# Patient Record
Sex: Male | Born: 1950 | Race: Black or African American | Hispanic: No | Marital: Married | Smoking: Former smoker
Health system: Southern US, Community
[De-identification: ages and names within clinical notes are randomized; demographics above are authoritative.]

## PROBLEM LIST (undated history)

## (undated) DIAGNOSIS — R7303 Prediabetes: Secondary | ICD-10-CM

## (undated) DIAGNOSIS — E785 Hyperlipidemia, unspecified: Secondary | ICD-10-CM

## (undated) DIAGNOSIS — M1990 Unspecified osteoarthritis, unspecified site: Secondary | ICD-10-CM

## (undated) DIAGNOSIS — C61 Malignant neoplasm of prostate: Secondary | ICD-10-CM

## (undated) DIAGNOSIS — N289 Disorder of kidney and ureter, unspecified: Secondary | ICD-10-CM

## (undated) DIAGNOSIS — K219 Gastro-esophageal reflux disease without esophagitis: Secondary | ICD-10-CM

## (undated) DIAGNOSIS — K760 Fatty (change of) liver, not elsewhere classified: Secondary | ICD-10-CM

## (undated) DIAGNOSIS — N529 Male erectile dysfunction, unspecified: Secondary | ICD-10-CM

## (undated) DIAGNOSIS — I82409 Acute embolism and thrombosis of unspecified deep veins of unspecified lower extremity: Secondary | ICD-10-CM

## (undated) DIAGNOSIS — D689 Coagulation defect, unspecified: Secondary | ICD-10-CM

## (undated) HISTORY — DX: Coagulation defect, unspecified: D68.9

## (undated) HISTORY — DX: Gastro-esophageal reflux disease without esophagitis: K21.9

## (undated) HISTORY — DX: Male erectile dysfunction, unspecified: N52.9

## (undated) HISTORY — DX: Prediabetes: R73.03

## (undated) HISTORY — PX: FRACTURE SURGERY: SHX138

## (undated) HISTORY — DX: Hyperlipidemia, unspecified: E78.5

## (undated) HISTORY — DX: Disorder of kidney and ureter, unspecified: N28.9

## (undated) HISTORY — DX: Unspecified osteoarthritis, unspecified site: M19.90

## (undated) HISTORY — DX: Malignant neoplasm of prostate: C61

---

## 2000-01-09 HISTORY — PX: COLONOSCOPY: SHX174

## 2005-01-08 HISTORY — PX: NASAL SEPTUM SURGERY: SHX37

## 2011-08-11 LAB — HEPATIC FUNCTION PANEL
ALK PHOS: 64 U/L
ALT: 23 U/L (ref 10–40)
AST: 22 U/L
Total Bilirubin: 0.5 mg/dL

## 2011-08-11 LAB — CBC
HEMOGLOBIN: 15.4 g/dL
PLATELET COUNT: 214
WBC: 3.6

## 2011-08-11 LAB — PSA: PSA: 3.3

## 2011-08-11 LAB — HEMOGLOBIN A1C: A1c: 6.1

## 2011-08-11 LAB — SEDIMENTATION RATE: SED RATE: 3 mm

## 2011-08-11 LAB — TSH: TSH: 2.76

## 2013-02-13 ENCOUNTER — Ambulatory Visit (INDEPENDENT_AMBULATORY_CARE_PROVIDER_SITE_OTHER): Payer: Federal, State, Local not specified - PPO | Admitting: Family Medicine

## 2013-02-13 ENCOUNTER — Encounter: Payer: Self-pay | Admitting: Family Medicine

## 2013-02-13 VITALS — BP 140/74 | HR 72 | Temp 98.3°F | Ht 69.0 in | Wt 223.0 lb

## 2013-02-13 DIAGNOSIS — Z125 Encounter for screening for malignant neoplasm of prostate: Secondary | ICD-10-CM

## 2013-02-13 DIAGNOSIS — R0989 Other specified symptoms and signs involving the circulatory and respiratory systems: Secondary | ICD-10-CM

## 2013-02-13 DIAGNOSIS — Z0000 Encounter for general adult medical examination without abnormal findings: Secondary | ICD-10-CM

## 2013-02-13 DIAGNOSIS — Z23 Encounter for immunization: Secondary | ICD-10-CM

## 2013-02-13 DIAGNOSIS — Z1211 Encounter for screening for malignant neoplasm of colon: Secondary | ICD-10-CM

## 2013-02-13 DIAGNOSIS — R6889 Other general symptoms and signs: Secondary | ICD-10-CM

## 2013-02-13 NOTE — Progress Notes (Signed)
BP 140/74  Pulse 72  Temp(Src) 98.3 F (36.8 C) (Oral)  Ht 5\' 9"  (1.753 m)  Wt 223 lb (101.152 kg)  BMI 32.92 kg/m2   CC: new pt to establish  Subjective:    Patient ID: Juan Hunt, male    DOB: 02-Aug-1950, 63 y.o.   MRN: [id number]  HPI: Mete Stahlberg is a 63 y.o. male presenting on 02/13/2013 with [place]  Recently moved from [place] 3 yrs ago, retired Development worker, community.   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  Never told had gout. Significant throat drainage, with constant clearing throat mild discomfort left throat. + GERD - has awoken him from sleep.  + acid reflux.  Lives with wife Grown children Occupation: retired Development worker, community Edu: HS Activity: no regular exercise Diet: some water, fruits/vegetables daily 7th day adventist  Preventative: Colonoscopy - WNL 2000, would like repeat Prostate cancer screening - last WNL 4 yrs ago Flu shot - today Tetanus shot - thinks around 2000.  Declines today. zostavax - 2014  Relevant past medical, surgical, family and social history reviewed and updated. Allergies and medications reviewed and updated. No current outpatient prescriptions on file prior to visit.   No current facility-administered medications on file prior to visit.    Review of Systems  Constitutional: Negative for fever, chills, activity change, appetite change, fatigue and unexpected weight change.  HENT: Negative for hearing loss.   Eyes: Negative for visual disturbance.  Respiratory: Positive for wheezing (?bronchitis). Negative for cough, chest tightness and shortness of breath.   Cardiovascular: Negative for chest pain, palpitations and leg swelling.  Gastrointestinal: Negative for nausea, vomiting, abdominal pain, diarrhea, constipation, blood in stool and abdominal distention.  Genitourinary: Negative for hematuria and difficulty urinating.  Musculoskeletal: Negative for arthralgias, myalgias and neck pain.  Skin: Negative  for rash.  Neurological: Negative for dizziness, seizures, syncope and headaches.  Hematological: Negative for adenopathy. Does not bruise/bleed easily.  Psychiatric/Behavioral: Negative for dysphoric mood. The patient is not nervous/anxious.    Per HPI unless specifically indicated above    Objective:    BP 140/74  Pulse 72  Temp(Src) 98.3 F (36.8 C) (Oral)  Ht 5\' 9"  (1.753 m)  Wt 223 lb (101.152 kg)  BMI 32.92 kg/m2  Physical Exam  Nursing note and vitals reviewed. Constitutional: He is oriented to person, place, and time. He appears well-developed and well-nourished. No distress.  HENT:  Head: Normocephalic and atraumatic.  Right Ear: Hearing, tympanic membrane, external ear and ear canal normal.  Left Ear: Hearing, tympanic membrane, external ear and ear canal normal.  Nose: Nose normal.  Mouth/Throat: Uvula is midline, oropharynx is clear and moist and mucous membranes are normal. No oropharyngeal exudate, posterior oropharyngeal edema or posterior oropharyngeal erythema.  Eyes: Conjunctivae and EOM are normal. Pupils are equal, round, and reactive to light. No scleral icterus.  Neck: Normal range of motion. Neck supple.  Cardiovascular: Normal rate, regular rhythm, normal heart sounds and intact distal pulses.   No murmur heard. Pulses:      Radial pulses are 2+ on the right side, and 2+ on the left side.  Pulmonary/Chest: Effort normal and breath sounds normal. No respiratory distress. He has no wheezes. He has no rales.  Abdominal: Soft. Bowel sounds are normal. He exhibits no distension and no mass. There is no tenderness. There is no rebound and no guarding.  Genitourinary: Rectum normal and prostate normal. Rectal exam shows no external hemorrhoid, no internal hemorrhoid,  no fissure, no mass, no tenderness and anal tone normal. Prostate is not enlarged and not tender.  Musculoskeletal: Normal range of motion. He exhibits no edema.  Lymphadenopathy:    He has no  cervical adenopathy.  Neurological: He is alert and oriented to person, place, and time.  CN grossly intact, station and gait intact  Skin: Skin is warm and dry. No rash noted.  Psychiatric: He has a normal mood and affect. His behavior is normal. Judgment and thought content normal.   No results found for this or any previous visit.    Assessment & Plan:   Problem List Items Addressed This Visit   Chronic throat clearing     Anticipate GERD related. Recommended trial of zantac or pepcid bid prn.  If not improved, trial of prilosec OTC.  nexium OTC samples provided today Update if sxs persist or fail to improve.    Health maintenance examination - Primary     Preventative protocols reviewed and updated unless pt declined. Discussed healthy diet and lifestyle.     Relevant Orders      Lipid panel      Basic metabolic panel    Other Visit Diagnoses   Special screening for malignant neoplasms, colon        Relevant Orders       Ambulatory referral to Gastroenterology    Special screening for malignant neoplasm of prostate        Relevant Orders       PSA        Follow up plan: Return as needed, for return for blood work at [facility].

## 2013-02-13 NOTE — Assessment & Plan Note (Signed)
Preventative protocols reviewed and updated unless pt declined. Discussed healthy diet and lifestyle.  

## 2013-02-13 NOTE — Patient Instructions (Addendum)
Flu shot today. Return at your convenience fasting for blood work. Pass by [facility]'s office to schedule referral for colonsocopy Prostate feeling normal today. Start walking daily For throat clearing - start zantac or pepcid once or twice daily.  If this doesn't help, try prilosec OTC 20mg  as needed.

## 2013-02-13 NOTE — Addendum Note (Signed)
Addended by: Royann Shivers A on: 02/13/2013 10:53 AM   Modules accepted: Orders

## 2013-02-13 NOTE — Progress Notes (Signed)
Pre-visit discussion using our clinic review tool. No additional management support is needed unless otherwise documented below in the visit note.  

## 2013-02-13 NOTE — Assessment & Plan Note (Signed)
Anticipate GERD related. Recommended trial of zantac or pepcid bid prn.  If not improved, trial of prilosec OTC.  nexium OTC samples provided today Update if sxs persist or fail to improve.

## 2013-02-25 ENCOUNTER — Other Ambulatory Visit: Payer: Federal, State, Local not specified - PPO

## 2013-02-25 ENCOUNTER — Encounter: Payer: Self-pay | Admitting: Internal Medicine

## 2013-02-27 ENCOUNTER — Other Ambulatory Visit (INDEPENDENT_AMBULATORY_CARE_PROVIDER_SITE_OTHER): Payer: Federal, State, Local not specified - PPO

## 2013-02-27 DIAGNOSIS — Z125 Encounter for screening for malignant neoplasm of prostate: Secondary | ICD-10-CM

## 2013-02-27 DIAGNOSIS — Z0000 Encounter for general adult medical examination without abnormal findings: Secondary | ICD-10-CM

## 2013-02-27 LAB — BASIC METABOLIC PANEL
BUN: 16 mg/dL (ref 6–23)
CALCIUM: 9.8 mg/dL (ref 8.4–10.5)
CO2: 31 mEq/L (ref 19–32)
Chloride: 102 mEq/L (ref 96–112)
Creatinine, Ser: 1 mg/dL (ref 0.4–1.5)
GFR: 99.39 mL/min (ref >60.00)
GLUCOSE: 83 mg/dL (ref 70–99)
Potassium: 4.4 mEq/L (ref 3.5–5.1)
SODIUM: 137 meq/L (ref 135–145)

## 2013-02-27 LAB — LIPID PANEL
CHOL/HDL RATIO: 6
Cholesterol: 239 mg/dL — ABNORMAL HIGH (ref 0–200)
HDL: 37.6 mg/dL — AB (ref >39.00)
Triglycerides: 106 mg/dL (ref 0.0–149.0)
VLDL: 21.2 mg/dL (ref 0.0–40.0)

## 2013-02-27 LAB — LDL CHOLESTEROL, DIRECT: Direct LDL: 182.3 mg/dL

## 2013-02-27 LAB — PSA: PSA: 3.69 ng/mL (ref 0.10–4.00)

## 2013-03-02 ENCOUNTER — Encounter: Payer: Self-pay | Admitting: *Deleted

## 2013-04-08 HISTORY — PX: COLONOSCOPY: SHX174

## 2013-04-15 ENCOUNTER — Ambulatory Visit (AMBULATORY_SURGERY_CENTER): Payer: Self-pay

## 2013-04-15 VITALS — Ht 69.5 in | Wt 216.0 lb

## 2013-04-15 DIAGNOSIS — Z8371 Family history of colonic polyps: Secondary | ICD-10-CM

## 2013-04-15 DIAGNOSIS — Z83719 Family history of colon polyps, unspecified: Secondary | ICD-10-CM

## 2013-04-15 MED ORDER — MOVIPREP 100 G PO SOLR: 1.0000 | Freq: Once | ORAL | Status: DC

## 2013-04-23 ENCOUNTER — Encounter: Payer: Self-pay | Admitting: Internal Medicine

## 2013-04-23 ENCOUNTER — Ambulatory Visit (AMBULATORY_SURGERY_CENTER): Payer: Federal, State, Local not specified - PPO | Admitting: Internal Medicine

## 2013-04-23 VITALS — BP 121/70 | HR 49 | Temp 97.1°F | Resp 16 | Ht 69.0 in | Wt 216.0 lb

## 2013-04-23 DIAGNOSIS — Z8371 Family history of colonic polyps: Secondary | ICD-10-CM

## 2013-04-23 DIAGNOSIS — D126 Benign neoplasm of colon, unspecified: Secondary | ICD-10-CM

## 2013-04-23 DIAGNOSIS — Z1211 Encounter for screening for malignant neoplasm of colon: Secondary | ICD-10-CM

## 2013-04-23 MED ORDER — SODIUM CHLORIDE 0.9 % IV SOLN: 500.0000 mL | INTRAVENOUS | Status: DC

## 2013-04-23 NOTE — Op Note (Signed)
[place]  [address] [place], [phone]   COLONOSCOPY PROCEDURE REPORT  PATIENT: Daniil, Boerman  MR#: [id number] BIRTHDATE: [birth year] , 63  yrs. old GENDER: Male ENDOSCOPIST: Jerene Bears, MD REFERRED [id number] Danise Mina, M.D. PROCEDURE DATE:  04/23/2013 PROCEDURE:   Colonoscopy with snare polypectomy First Screening Colonoscopy - Avg.  risk and is 50 yrs.  old or older - No.  Prior Negative Screening - Now for repeat screening. 10 or more years since last screening  History of Adenoma - Now for follow-up colonoscopy & has been > or = to 3 yrs.  N/A  Polyps Removed Today? Yes. ASA CLASS:   Class II INDICATIONS:average risk screening and Last colonoscopy performed 12 yrs ago. MEDICATIONS: MAC sedation, administered by CRNA and propofol (Diprivan) 200mg  IV  DESCRIPTION OF PROCEDURE:   After the risks benefits and alternatives of the procedure were thoroughly explained, informed consent was obtained.  A digital rectal exam revealed no rectal mass.   The LB [id number]  endoscope was introduced through the anus and advanced to the cecum, which was identified by both the appendix and ileocecal valve. No adverse events experienced. The quality of the prep was good, using MoviPrep  The instrument was then slowly withdrawn as the colon was fully examined.  COLON FINDINGS: Two sessile polyps measuring 4-5 mm in size were found in the ascending colon and transverse colon.  Polypectomy was performed using cold snare.  All resections were complete and all polyp tissue was completely retrieved.   There was moderate diverticulosis noted in the descending colon and sigmoid colon with associated muscular hypertrophy.  Retroflexed views revealed no abnormalities. The time to cecum=3 minutes 01 seconds.  Withdrawal time=11 minutes 03 seconds.  The scope was withdrawn and the procedure completed. COMPLICATIONS: There were no complications.  ENDOSCOPIC  IMPRESSION: 1.   Two sessile polyps measuring 4-5 mm in size were found in the ascending colon and transverse colon; Polypectomy was performed using cold snare 2.   There was moderate diverticulosis noted in the descending colon and sigmoid colon  RECOMMENDATIONS: 1.  Await pathology results 2.  High fiber diet 3.  If the polyps removed today are proven to be adenomatous (pre-cancerous) polyps, you will need a repeat colonoscopy in 5 years.  Otherwise you should continue to follow colorectal cancer screening guidelines for "routine risk" patients with colonoscopy in 10 years.  You will receive a letter within 1-2 weeks with the results of your biopsy as well as final recommendations.  Please call my office if you have not received a letter after 3 weeks.   eSigned:  Jerene Bears, MD 04/23/2013 11:33 AM    cc: The Patient and Ria Bush MD

## 2013-04-23 NOTE — Patient Instructions (Signed)
YOU HAD AN ENDOSCOPIC PROCEDURE TODAY AT THE [facility]: Refer to the procedure report that was given to you for any specific questions about what was found during the examination.  If the procedure report does not answer your questions, please call your gastroenterologist to clarify.  If you requested that your care partner not be given the details of your procedure findings, then the procedure report has been included in a sealed envelope for you to review at your convenience later.  YOU SHOULD EXPECT: Some feelings of bloating in the abdomen. Passage of more gas than usual.  Walking can help get rid of the air that was put into your GI tract during the procedure and reduce the bloating. If you had a lower endoscopy (such as a colonoscopy or flexible sigmoidoscopy) you may notice spotting of blood in your stool or on the toilet paper. If you underwent a bowel prep for your procedure, then you may not have a normal bowel movement for a few days.  DIET: Your first meal following the procedure should be a light meal and then it is ok to progress to your normal diet.  A half-sandwich or bowl of soup is an example of a good first meal.  Heavy or fried foods are harder to digest and may make you feel nauseous or bloated.  Likewise meals heavy in dairy and vegetables can cause extra gas to form and this can also increase the bloating.  Drink plenty of fluids but you should avoid alcoholic beverages for 24 hours.  ACTIVITY: Your care partner should take you home directly after the procedure.  You should plan to take it easy, moving slowly for the rest of the day.  You can resume normal activity the day after the procedure however you should NOT DRIVE or use heavy machinery for 24 hours (because of the sedation medicines used during the test).    SYMPTOMS TO REPORT IMMEDIATELY: A gastroenterologist can be reached at any hour.  During normal business hours, 8:30 AM to 5:00 PM Monday through Friday,  call [phone].  After hours and on weekends, please call the GI answering service at [phone] who will take a message and have the physician on call contact you.   Following lower endoscopy (colonoscopy or flexible sigmoidoscopy):  Excessive amounts of blood in the stool  Significant tenderness or worsening of abdominal pains  Swelling of the abdomen that is new, acute  Fever of 100F or higher   FOLLOW UP: If any biopsies were taken you will be contacted by phone or by letter within the next 1-3 weeks.  Call your gastroenterologist if you have not heard about the biopsies in 3 weeks.  Our staff will call the home number listed on your records the next business day following your procedure to check on you and address any questions or concerns that you may have at that time regarding the information given to you following your procedure. This is a courtesy call and so if there is no answer at the home number and we have not heard from you through the emergency physician on call, we will assume that you have returned to your regular daily activities without incident.  SIGNATURES/CONFIDENTIALITY: You and/or your care partner have signed paperwork which will be entered into your electronic medical record.  These signatures attest to the fact that that the information above on your After Visit Summary has been reviewed and is understood.  Full responsibility of the confidentiality of   this discharge information lies with you and/or your care-partner.   Handouts were given to your care partner on polyps, diverticulosis and a high fiber diet with liberal fluid intake. You may resume your current medications today. Await pathology results. Please call if any questions or concerns.

## 2013-04-23 NOTE — Progress Notes (Signed)
Report to pacu rn, vss, bbs=clear 

## 2013-04-23 NOTE — Progress Notes (Signed)
Called to room to assist during endoscopic procedure.  Patient ID and intended procedure confirmed with present staff. Received instructions for my participation in the procedure from the performing physician.  

## 2013-04-24 ENCOUNTER — Telehealth: Payer: Self-pay | Admitting: *Deleted

## 2013-04-24 NOTE — Telephone Encounter (Signed)
  Follow up Call-  Call back number 04/23/2013  Post procedure Call Back phone  # [phone]  Permission to leave phone message Yes    [facility]

## 2013-04-27 ENCOUNTER — Encounter: Payer: Self-pay | Admitting: *Deleted

## 2013-05-11 ENCOUNTER — Encounter: Payer: Self-pay | Admitting: Internal Medicine

## 2013-05-13 ENCOUNTER — Encounter: Payer: Self-pay | Admitting: Family Medicine

## 2014-01-12 ENCOUNTER — Telehealth: Payer: Self-pay | Admitting: Family Medicine

## 2014-01-12 NOTE — Telephone Encounter (Signed)
Pt made my chart message see below  Is it ok to wait till 1/12  Annual Physical I have a consistent slight pain in my left eye along with sudden twitching of the eye. Also the eye will blur when watching tv or reading. These symptoms usually last for about a minute and then return unexpectedly.

## 2014-01-13 NOTE — Telephone Encounter (Signed)
Will eval at OV

## 2014-01-19 ENCOUNTER — Ambulatory Visit (INDEPENDENT_AMBULATORY_CARE_PROVIDER_SITE_OTHER): Payer: Federal, State, Local not specified - PPO | Admitting: Family Medicine

## 2014-01-19 ENCOUNTER — Encounter: Payer: Self-pay | Admitting: Family Medicine

## 2014-01-19 VITALS — BP 140/80 | HR 64 | Temp 98.0°F | Ht 69.0 in | Wt 221.2 lb

## 2014-01-19 DIAGNOSIS — G245 Blepharospasm: Secondary | ICD-10-CM

## 2014-01-19 MED ORDER — TADALAFIL 20 MG PO TABS: 20.0000 mg | ORAL_TABLET | Freq: Every day | ORAL | Status: DC | PRN

## 2014-01-19 NOTE — Addendum Note (Signed)
Addended by: Ria Bush on: 01/19/2014 10:03 AM   Modules accepted: Miquel Dunn

## 2014-01-19 NOTE — Assessment & Plan Note (Signed)
Anticipate left sided benign essential blepharospasm, discussed this He is due for eye exam - suggested he schedule this. Snellen normal today.

## 2014-01-19 NOTE — Patient Instructions (Signed)
Return after 2/6 for next physical. Vision check today Schedule appointment with eye doctor for vision check.  Benign Essential Blepharospasm Benign essential blepharospasm (BEB) is a condition in which the muscles of the eyelids have involuntary (you cannot control them) contractions and spasms. In this condition, the eyes seem to be winking or even seem to be forced shut. There may be twitching of the eyelids that lasts for a long period of time.  BEB tends to occur while you are awake. The disorder may get worse, to the point that the eyes are closed for long periods of time. Although this interferes with vision, it is not a form of blindness, because the eyes themselves are not abnormal and are capable of seeing. You are simply unable to see through the closed lids. BEB seems to occur most commonly in middle aged and elderly women. BEB is different from occasional twitching of the eyelids, which happens to many people. Occasional twitching is called "benign fasciculation," and it has no lasting effect. It goes away within a few hours or days.  SYMPTOMS  BEB usually starts slowly. At first, you may:  Be blinking a lot.  Have a feeling of irritation in your eyes.  Notice your eyelids twitching a lot.  Be squinting more than usual; especially in bright light. As time goes on, you may have difficulty keeping your eyes open. In general, symptoms occur while you are awake and go away while you sleep. As time goes on, all of the symptoms seem to get worse and more intense. TREATMENT  Most commonly, BEB is treated with injections of botulinum toxin (Botox). Botox relaxes the muscles, stopping the twitching. Treatment may cause drooping eyelids, blurring of vision, dry eyes, or double vision, but these symptoms are usually temporary. Document Released: 12/15/2001 Document Revised: 03/19/2011 Document Reviewed: 11/04/2008 [facility] Patient Information 2015 [place], [place]. This information is not  intended to replace advice given to you by your health care provider. Make sure you discuss any questions you have with your health care provider.

## 2014-01-19 NOTE — Progress Notes (Addendum)
BP 140/80 mmHg  Pulse 64  Temp(Src) 98 F (36.7 C) (Oral)  Ht 5\' 9"  (1.753 m)  Wt 221 lb 4 oz (100.358 kg)  BMI 32.66 kg/m2   CC: CPE- but a bit early so changed to acute visit - twitching L eye Subjective:    Patient ID: Juan Hunt, male    DOB: [birth year], 64 y.o.   MRN: [id number]  HPI: Juan Hunt is a 64 y.o. male presenting on 01/19/2014 for Annual Exam and Eye twitch   CPE not done - converted to acute visit. Traveled to [place] for holidays.   L eye has been twitching for last four months. Sometimes has difficulty focusing L eye. Started with L eye discomfort that lasted 2 weeks. Discomfort has improved, but twitching has continued. Random twitching, more noticeable with bright light or when reading.   Last eye exam was 2 yrs ago.  Denies flashing lights, aura. Occasional floaters and drainage from L eye. No vision loss but does note more blurry vision occasionally. No other facial spasm or weakness. No headaches.  Started walking daily, increasing fruits/vegetables   Preventative: COLONOSCOPY Date: 04/2013 2 TA, mod diverticulosis, rpt 5 yrs (Pyrtle) Prostate cancer screening - last WNL 4 yrs ago Flu shot - today Tetanus shot - thinks around 2000. Declines today. zostavax - 2014  Relevant past medical, surgical, family and social history reviewed and updated as indicated. Interim medical history since our last visit reviewed. Allergies and medications reviewed and updated. Current Outpatient Prescriptions on File Prior to Visit  Medication Sig  . Multiple Vitamin (MULTIVITAMIN) tablet Take 1 tablet by mouth daily.  . Omega-3 Fatty Acids (OMEGA 3 PO) Take 1 capsule by mouth daily.   No current facility-administered medications on file prior to visit.    Review of Systems Per HPI unless specifically indicated above     Objective:    BP 140/80 mmHg  Pulse 64  Temp(Src) 98 F (36.7 C) (Oral)  Ht 5\' 9"  (1.753 m)  Wt 221 lb 4 oz (100.358 kg)  BMI 32.66  kg/m2  Wt Readings from Last 3 Encounters:  01/19/14 221 lb 4 oz (100.358 kg)  04/23/13 216 lb (97.977 kg)  04/15/13 216 lb (97.977 kg)    Physical Exam  Constitutional: He is oriented to person, place, and time. He appears well-developed and well-nourished. No distress.  HENT:  Head: Normocephalic and atraumatic.  Mouth/Throat: Oropharynx is clear and moist. No oropharyngeal exudate.  Eyes: Conjunctivae, EOM and lids are normal. Pupils are equal, round, and reactive to light. Right eye exhibits no discharge. Left eye exhibits no discharge. Right conjunctiva is not injected. Left conjunctiva is not injected.  Intact optic nerves bilaterally on limited fundoscopic exam Early cataracts L>R  Neck: Normal range of motion. Neck supple. No thyromegaly present.  Neurological: He is alert and oriented to person, place, and time. He has normal strength. No cranial nerve deficit or sensory deficit.  CN 2-12 intact  Skin: Skin is warm and dry. No rash noted.  Psychiatric: He has a normal mood and affect.  Nursing note and vitals reviewed.      Assessment & Plan:   Problem List Items Addressed This Visit    Blepharospasm - Primary    Anticipate left sided benign essential blepharospasm, discussed this He is due for eye exam - suggested he schedule this. Snellen normal today.        Follow up plan: Return in about 4 weeks (around 02/16/2014), or as needed,  for annual exam, prior fasting for blood work.

## 2014-01-19 NOTE — Progress Notes (Signed)
Pre visit review using our clinic review tool, if applicable. No additional management support is needed unless otherwise documented below in the visit note. 

## 2014-02-13 ENCOUNTER — Other Ambulatory Visit: Payer: Self-pay | Admitting: Family Medicine

## 2014-02-13 ENCOUNTER — Encounter: Payer: Self-pay | Admitting: Family Medicine

## 2014-02-13 DIAGNOSIS — R7303 Prediabetes: Secondary | ICD-10-CM

## 2014-02-13 DIAGNOSIS — E785 Hyperlipidemia, unspecified: Secondary | ICD-10-CM

## 2014-02-13 DIAGNOSIS — Z125 Encounter for screening for malignant neoplasm of prostate: Secondary | ICD-10-CM

## 2014-02-13 HISTORY — DX: Prediabetes: R73.03

## 2014-02-13 HISTORY — DX: Hyperlipidemia, unspecified: E78.5

## 2014-02-16 ENCOUNTER — Other Ambulatory Visit (INDEPENDENT_AMBULATORY_CARE_PROVIDER_SITE_OTHER): Payer: Federal, State, Local not specified - PPO

## 2014-02-16 DIAGNOSIS — Z125 Encounter for screening for malignant neoplasm of prostate: Secondary | ICD-10-CM

## 2014-02-16 DIAGNOSIS — E785 Hyperlipidemia, unspecified: Secondary | ICD-10-CM

## 2014-02-16 DIAGNOSIS — R7303 Prediabetes: Secondary | ICD-10-CM

## 2014-02-16 DIAGNOSIS — R7309 Other abnormal glucose: Secondary | ICD-10-CM

## 2014-02-16 LAB — HEMOGLOBIN A1C: Hgb A1c MFr Bld: 6.1 % (ref 4.6–6.5)

## 2014-02-16 LAB — BASIC METABOLIC PANEL
BUN: 17 mg/dL (ref 6–23)
CO2: 28 mEq/L (ref 19–32)
CREATININE: 0.99 mg/dL (ref 0.40–1.50)
Calcium: 9.3 mg/dL (ref 8.4–10.5)
Chloride: 104 mEq/L (ref 96–112)
GFR: 97.92 mL/min (ref >60.00)
Glucose, Bld: 97 mg/dL (ref 70–99)
POTASSIUM: 4.1 meq/L (ref 3.5–5.1)
Sodium: 138 mEq/L (ref 135–145)

## 2014-02-16 LAB — PSA: PSA: 3.87 ng/mL (ref 0.10–4.00)

## 2014-02-16 LAB — LIPID PANEL
CHOLESTEROL: 244 mg/dL — AB (ref 0–200)
HDL: 38.8 mg/dL — ABNORMAL LOW (ref >39.00)
LDL CALC: 177 mg/dL — AB (ref 0–99)
NONHDL: 205.2
Total CHOL/HDL Ratio: 6
Triglycerides: 140 mg/dL (ref 0.0–149.0)
VLDL: 28 mg/dL (ref 0.0–40.0)

## 2014-02-19 ENCOUNTER — Encounter: Payer: Self-pay | Admitting: Family Medicine

## 2014-02-19 ENCOUNTER — Ambulatory Visit (INDEPENDENT_AMBULATORY_CARE_PROVIDER_SITE_OTHER): Payer: Federal, State, Local not specified - PPO | Admitting: Family Medicine

## 2014-02-19 VITALS — BP 134/76 | HR 72 | Temp 97.5°F | Ht 69.0 in | Wt 223.2 lb

## 2014-02-19 DIAGNOSIS — E785 Hyperlipidemia, unspecified: Secondary | ICD-10-CM

## 2014-02-19 DIAGNOSIS — R7303 Prediabetes: Secondary | ICD-10-CM

## 2014-02-19 DIAGNOSIS — S6991XA Unspecified injury of right wrist, hand and finger(s), initial encounter: Secondary | ICD-10-CM | POA: Insufficient documentation

## 2014-02-19 DIAGNOSIS — Z0000 Encounter for general adult medical examination without abnormal findings: Secondary | ICD-10-CM

## 2014-02-19 DIAGNOSIS — Z23 Encounter for immunization: Secondary | ICD-10-CM

## 2014-02-19 DIAGNOSIS — E663 Overweight: Secondary | ICD-10-CM | POA: Insufficient documentation

## 2014-02-19 DIAGNOSIS — E66811 Obesity, class 1: Secondary | ICD-10-CM | POA: Insufficient documentation

## 2014-02-19 DIAGNOSIS — E669 Obesity, unspecified: Secondary | ICD-10-CM | POA: Insufficient documentation

## 2014-02-19 MED ORDER — DOXYCYCLINE HYCLATE 100 MG PO CAPS: 100.0000 mg | ORAL_CAPSULE | Freq: Two times a day (BID) | ORAL | Status: DC

## 2014-02-19 NOTE — Assessment & Plan Note (Signed)
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 Tdap today. Start doxy 7d course Red flags to seek care discussed.

## 2014-02-19 NOTE — Assessment & Plan Note (Signed)
Body mass index is 32.95 kg/(m^2). Discussed healthy diet and lifestyle changes to affect sustainable weight loss.

## 2014-02-19 NOTE — Patient Instructions (Addendum)
Tdap and flu shots today. Start antibiotic doxycycline twice daily for 7 days for thumb pain. Let us know if not improving as expected. Work over next 6 months on healthy diet and lifestyle changes and weight loss. Decrease added sugars, eliminate trans fats, increase fiber and limit alcohol.  All these changes together can drop triglycerides by almost 50%. Return in 6 months for labwork only Return in 1 year for next physical.

## 2014-02-19 NOTE — Assessment & Plan Note (Signed)
Reviewed #s. Discussed importance of weight loss and avoiding added sugars.

## 2014-02-19 NOTE — Progress Notes (Signed)
Pre visit review using our clinic review tool, if applicable. No additional management support is needed unless otherwise documented below in the visit note. 

## 2014-02-19 NOTE — Progress Notes (Signed)
BP 134/76 mmHg  Pulse 72  Temp(Src) 97.5 F (36.4 C) (Oral)  Ht 5\' 9"  (1.753 m)  Wt 223 lb 4 oz (101.266 kg)  BMI 32.95 kg/m2   CC: CPE  Subjective:    Patient ID: Juan Hunt, male    DOB: 1950/04/08, 64 y.o.   MRN: [id number]  HPI: Juan Hunt is a 64 y.o. male presenting on 02/19/2014 for Annual Exam   Recent injury 4d ago while doing some plumbing work. Nit R thumb on pipe, broke skin. Getting red and tender. Some draining initially but not recently. Has been cleaning with abx ointment.   Changing diet recently, cutting out fried foods, eating more grains and vegetables. Eating fiber one cereal. More oatmeal. He has also increased walking regimen.   Preventative: COLONOSCOPY Date: 04/2013 2 TA, mod diverticulosis, rpt 5 yrs ([facility]) Prostate cancer screening - last WNL 4 yrs ago  Flu shot - today Tdap today zostavax - 2014  Lives with wife Grown children Occupation: retired Development worker, community, still does some plumbing work.  Edu: HS Activity: no regular exercise Diet: some water, fruits/vegetables daily 7th day adventist  Relevant past medical, surgical, family and social history reviewed and updated as indicated. Interim medical history since our last visit reviewed. Allergies and medications reviewed and updated. Current Outpatient Prescriptions on File Prior to Visit  Medication Sig  . Multiple Vitamin (MULTIVITAMIN) tablet Take 1 tablet by mouth daily.  . Omega-3 Fatty Acids (OMEGA 3 PO) Take 1 capsule by mouth daily.  . tadalafil (CIALIS) 20 MG tablet Take 1 tablet (20 mg total) by mouth daily as needed for erectile dysfunction.  . Wheat Dextrin (BENEFIBER) POWD Take 1 scoop by mouth daily.   No current facility-administered medications on file prior to visit.    Review of Systems  Constitutional: Negative for fever, chills, activity change, appetite change, fatigue and unexpected weight change.  HENT: Negative for hearing loss.   Eyes: Negative for visual  disturbance.  Respiratory: Negative for cough, chest tightness, shortness of breath and wheezing.   Cardiovascular: Negative for chest pain, palpitations and leg swelling.  Gastrointestinal: Negative for nausea, vomiting, abdominal pain, diarrhea, constipation, blood in stool and abdominal distention.  Genitourinary: Negative for hematuria and difficulty urinating.  Musculoskeletal: Negative for myalgias, arthralgias and neck pain.  Skin: Negative for rash.  Neurological: Negative for dizziness, seizures, syncope and headaches.  Hematological: Negative for adenopathy. Does not bruise/bleed easily.  Psychiatric/Behavioral: Negative for dysphoric mood. The patient is not nervous/anxious.    Per HPI unless specifically indicated above     Objective:    BP 134/76 mmHg  Pulse 72  Temp(Src) 97.5 F (36.4 C) (Oral)  Ht 5\' 9"  (1.753 m)  Wt 223 lb 4 oz (101.266 kg)  BMI 32.95 kg/m2  Wt Readings from Last 3 Encounters:  02/19/14 223 lb 4 oz (101.266 kg)  01/19/14 221 lb 4 oz (100.358 kg)  04/23/13 216 lb (97.977 kg)    Physical Exam  Constitutional: He is oriented to person, place, and time. He appears well-developed and well-nourished. No distress.  HENT:  Head: Normocephalic and atraumatic.  Right Ear: Hearing, tympanic membrane, external ear and ear canal normal.  Left Ear: Hearing, tympanic membrane, external ear and ear canal normal.  Nose: Nose normal.  Mouth/Throat: Uvula is midline, oropharynx is clear and moist and mucous membranes are normal. No oropharyngeal exudate, posterior oropharyngeal edema or posterior oropharyngeal erythema.  Eyes: Conjunctivae and EOM are normal. Pupils are equal, round, and reactive  to light. No scleral icterus.  Neck: Normal range of motion. Neck supple. Carotid bruit is not present. No thyromegaly present.  Cardiovascular: Normal rate, regular rhythm, normal heart sounds and intact distal pulses.   No murmur heard. Pulses:      Radial pulses are  2+ on the right side, and 2+ on the left side.  Pulmonary/Chest: Effort normal and breath sounds normal. No respiratory distress. He has no wheezes. He has no rales.  Abdominal: Soft. Bowel sounds are normal. He exhibits no distension and no mass. There is no tenderness. There is no rebound and no guarding.  Genitourinary: Rectum normal. Rectal exam shows no external hemorrhoid, no internal hemorrhoid, no fissure, no mass, no tenderness and anal tone normal. Prostate is enlarged (25-30gm). Prostate is not tender.  Musculoskeletal: Normal range of motion. He exhibits no edema.  R thumb distal to dorsal IP joint with erosion with mild surrounding erythema/induration. IP joint ROM intact  Lymphadenopathy:    He has no cervical adenopathy.  Neurological: He is alert and oriented to person, place, and time.  CN grossly intact, station and gait intact  Skin: Skin is warm and dry. No rash noted.  Psychiatric: He has a normal mood and affect. His behavior is normal. Judgment and thought content normal.  Nursing note and vitals reviewed.  Results for orders placed or performed in visit on 02/16/14  Lipid panel  Result Value Ref Range   Cholesterol 244 (H) 0 - 200 mg/dL   Triglycerides 140.0 0.0 - 149.0 mg/dL   HDL 38.80 (L) >39.00 mg/dL   VLDL 28.0 0.0 - 40.0 mg/dL   LDL Cholesterol 177 (H) 0 - 99 mg/dL   Total CHOL/HDL Ratio 6    NonHDL 205.20   Hemoglobin A1c  Result Value Ref Range   Hgb A1c MFr Bld 6.1 4.6 - 6.5 %  PSA  Result Value Ref Range   PSA 3.87 0.10 - 4.00 ng/mL  Basic metabolic panel  Result Value Ref Range   Sodium 138 135 - 145 mEq/L   Potassium 4.1 3.5 - 5.1 mEq/L   Chloride 104 96 - 112 mEq/L   CO2 28 19 - 32 mEq/L   Glucose, Bld 97 70 - 99 mg/dL   BUN 17 6 - 23 mg/dL   Creatinine, Ser 0.99 0.40 - 1.50 mg/dL   Calcium 9.3 8.4 - 10.5 mg/dL   GFR 97.92 >60.00 mL/min      Assessment & Plan:   Problem List Items Addressed This Visit    Prediabetes    Reviewed #s.  Discussed importance of weight loss and avoiding added sugars.      Obesity    Body mass index is 32.95 kg/(m^2). Discussed healthy diet and lifestyle changes to affect sustainable weight loss.      Injury of right thumb    Concern for mild infection of R thumb after injury sustained while doing plumbing work 4d ago. Tdap today. Start doxy 7d course Red flags to seek care discussed.      Hyperlipidemia    Reviewed FLP - prior on statin. Requests 6 mo trial of TLC. Discussed diet and lifestyle changes to improve #s. Recheck labs in 6 months. If persistently abnormal will start statin. CHD 11yr risk 16% by framingham. Consider aspirin at f/u.      Health maintenance examination - Primary    Preventative protocols reviewed and updated unless pt declined. Discussed healthy diet and lifestyle.  Follow up plan: Return in about 1 year (around 02/20/2015), or if symptoms worsen or fail to improve, for annual exam, prior fasting for blood work.

## 2014-02-19 NOTE — Addendum Note (Signed)
Addended by: Royann Shivers A on: 02/19/2014 11:47 AM   Modules accepted: Orders

## 2014-02-19 NOTE — Assessment & Plan Note (Addendum)
Reviewed FLP - prior on statin. Requests 6 mo trial of TLC. Discussed diet and lifestyle changes to improve #s. Recheck labs in 6 months. If persistently abnormal will start statin. CHD 16yr risk 16% by framingham. Consider aspirin at f/u.

## 2014-02-19 NOTE — Assessment & Plan Note (Signed)
Preventative protocols reviewed and updated unless pt declined. Discussed healthy diet and lifestyle.  

## 2014-08-18 ENCOUNTER — Other Ambulatory Visit: Payer: Self-pay | Admitting: Family Medicine

## 2014-08-18 DIAGNOSIS — E785 Hyperlipidemia, unspecified: Secondary | ICD-10-CM

## 2014-08-20 ENCOUNTER — Other Ambulatory Visit: Payer: Federal, State, Local not specified - PPO

## 2014-12-17 ENCOUNTER — Encounter (HOSPITAL_COMMUNITY): Payer: Self-pay | Admitting: *Deleted

## 2014-12-17 ENCOUNTER — Emergency Department (HOSPITAL_COMMUNITY)
Admission: EM | Admit: 2014-12-17 | Discharge: 2014-12-17 | Disposition: A | Discharge status: Home / Self Care | Payer: Federal, State, Local not specified - PPO | Attending: Emergency Medicine | Admitting: Emergency Medicine

## 2014-12-17 ENCOUNTER — Emergency Department (HOSPITAL_COMMUNITY): Payer: Federal, State, Local not specified - PPO

## 2014-12-17 DIAGNOSIS — Z79899 Other long term (current) drug therapy: Secondary | ICD-10-CM | POA: Diagnosis not present

## 2014-12-17 DIAGNOSIS — Z8639 Personal history of other endocrine, nutritional and metabolic disease: Secondary | ICD-10-CM | POA: Insufficient documentation

## 2014-12-17 DIAGNOSIS — Z87891 Personal history of nicotine dependence: Secondary | ICD-10-CM | POA: Insufficient documentation

## 2014-12-17 DIAGNOSIS — Z792 Long term (current) use of antibiotics: Secondary | ICD-10-CM | POA: Insufficient documentation

## 2014-12-17 DIAGNOSIS — S99912A Unspecified injury of left ankle, initial encounter: Secondary | ICD-10-CM | POA: Insufficient documentation

## 2014-12-17 DIAGNOSIS — S8992XA Unspecified injury of left lower leg, initial encounter: Secondary | ICD-10-CM | POA: Insufficient documentation

## 2014-12-17 DIAGNOSIS — Y9389 Activity, other specified: Secondary | ICD-10-CM | POA: Diagnosis not present

## 2014-12-17 DIAGNOSIS — M1990 Unspecified osteoarthritis, unspecified site: Secondary | ICD-10-CM | POA: Diagnosis not present

## 2014-12-17 DIAGNOSIS — R55 Syncope and collapse: Secondary | ICD-10-CM | POA: Diagnosis not present

## 2014-12-17 DIAGNOSIS — S161XXA Strain of muscle, fascia and tendon at neck level, initial encounter: Secondary | ICD-10-CM | POA: Diagnosis not present

## 2014-12-17 DIAGNOSIS — S199XXA Unspecified injury of neck, initial encounter: Secondary | ICD-10-CM | POA: Diagnosis present

## 2014-12-17 DIAGNOSIS — Y92410 Unspecified street and highway as the place of occurrence of the external cause: Secondary | ICD-10-CM | POA: Diagnosis not present

## 2014-12-17 DIAGNOSIS — Y998 Other external cause status: Secondary | ICD-10-CM | POA: Insufficient documentation

## 2014-12-17 DIAGNOSIS — S20212A Contusion of left front wall of thorax, initial encounter: Secondary | ICD-10-CM

## 2014-12-17 DIAGNOSIS — S3991XA Unspecified injury of abdomen, initial encounter: Secondary | ICD-10-CM | POA: Insufficient documentation

## 2014-12-17 DIAGNOSIS — Z87438 Personal history of other diseases of male genital organs: Secondary | ICD-10-CM | POA: Insufficient documentation

## 2014-12-17 LAB — CBC WITH DIFFERENTIAL/PLATELET
BASOS ABS: 0 10*3/uL (ref 0.0–0.1)
Basophils Relative: 0 %
EOS ABS: 0 10*3/uL (ref 0.0–0.7)
EOS PCT: 1 %
HCT: 42.6 % (ref 39.0–52.0)
Hemoglobin: 13.8 g/dL (ref 13.0–17.0)
LYMPHS PCT: 54 %
Lymphs Abs: 1.7 10*3/uL (ref 0.7–4.0)
MCH: 26.5 pg (ref 26.0–34.0)
MCHC: 32.4 g/dL (ref 30.0–36.0)
MCV: 81.9 fL (ref 78.0–100.0)
MONO ABS: 0.3 10*3/uL (ref 0.1–1.0)
Monocytes Relative: 10 %
Neutro Abs: 1.1 10*3/uL — ABNORMAL LOW (ref 1.7–7.7)
Neutrophils Relative %: 35 %
PLATELETS: 171 10*3/uL (ref 150–400)
RBC: 5.2 MIL/uL (ref 4.22–5.81)
RDW: 14 % (ref 11.5–15.5)
WBC: 3.2 10*3/uL — AB (ref 4.0–10.5)

## 2014-12-17 LAB — RAPID URINE DRUG SCREEN, HOSP PERFORMED
Amphetamines: NOT DETECTED
Barbiturates: NOT DETECTED
Benzodiazepines: NOT DETECTED
COCAINE: NOT DETECTED
OPIATES: NOT DETECTED
TETRAHYDROCANNABINOL: NOT DETECTED

## 2014-12-17 LAB — CBG MONITORING, ED: GLUCOSE-CAPILLARY: 92 mg/dL (ref 65–99)

## 2014-12-17 LAB — COMPREHENSIVE METABOLIC PANEL
ALT: 36 U/L (ref 17–63)
AST: 28 U/L (ref 15–41)
Albumin: 3.8 g/dL (ref 3.5–5.0)
Alkaline Phosphatase: 69 U/L (ref 38–126)
Anion gap: 8 (ref 5–15)
BUN: 13 mg/dL (ref 6–20)
CHLORIDE: 106 mmol/L (ref 101–111)
CO2: 24 mmol/L (ref 22–32)
Calcium: 9.6 mg/dL (ref 8.9–10.3)
Creatinine, Ser: 0.95 mg/dL (ref 0.61–1.24)
Glucose, Bld: 100 mg/dL — ABNORMAL HIGH (ref 65–99)
POTASSIUM: 4.1 mmol/L (ref 3.5–5.1)
SODIUM: 138 mmol/L (ref 135–145)
Total Bilirubin: 0.4 mg/dL (ref 0.3–1.2)
Total Protein: 7 g/dL (ref 6.5–8.1)

## 2014-12-17 LAB — URINALYSIS, ROUTINE W REFLEX MICROSCOPIC
Bilirubin Urine: NEGATIVE
Glucose, UA: NEGATIVE mg/dL
Hgb urine dipstick: NEGATIVE
Ketones, ur: NEGATIVE mg/dL
LEUKOCYTES UA: NEGATIVE
NITRITE: NEGATIVE
PH: 7.5 (ref 5.0–8.0)
Protein, ur: NEGATIVE mg/dL
SPECIFIC GRAVITY, URINE: 1.008 (ref 1.005–1.030)

## 2014-12-17 LAB — TYPE AND SCREEN
ABO/RH(D): B POS
ANTIBODY SCREEN: NEGATIVE

## 2014-12-17 LAB — ETHANOL

## 2014-12-17 LAB — I-STAT TROPONIN, ED: TROPONIN I, POC: 0.01 ng/mL (ref 0.00–0.08)

## 2014-12-17 LAB — ABO/RH: ABO/RH(D): B POS

## 2014-12-17 MED ORDER — FENTANYL CITRATE (PF) 100 MCG/2ML IJ SOLN
100.0000 ug | Freq: Once | INTRAMUSCULAR | Status: AC
  Administered 2014-12-17: 100 ug via INTRAVENOUS
  Filled 2014-12-17: qty 2

## 2014-12-17 MED ORDER — METHOCARBAMOL 500 MG PO TABS: 1000.0000 mg | ORAL_TABLET | Freq: Three times a day (TID) | ORAL | Status: DC | PRN

## 2014-12-17 MED ORDER — IOHEXOL 300 MG/ML  SOLN
100.0000 mL | Freq: Once | INTRAMUSCULAR | Status: AC | PRN
  Administered 2014-12-17: 100 mL via INTRAVENOUS

## 2014-12-17 MED ORDER — OXYCODONE-ACETAMINOPHEN 5-325 MG PO TABS: 1.0000 | ORAL_TABLET | ORAL | Status: DC | PRN

## 2014-12-17 MED ORDER — KETOROLAC TROMETHAMINE 30 MG/ML IJ SOLN
30.0000 mg | Freq: Once | INTRAMUSCULAR | Status: AC
  Administered 2014-12-17: 30 mg via INTRAVENOUS
  Filled 2014-12-17: qty 1

## 2014-12-17 MED ORDER — METHOCARBAMOL 500 MG PO TABS
1000.0000 mg | ORAL_TABLET | Freq: Once | ORAL | Status: AC
  Administered 2014-12-17: 1000 mg via ORAL
  Filled 2014-12-17: qty 2

## 2014-12-17 MED ORDER — FENTANYL CITRATE (PF) 100 MCG/2ML IJ SOLN
50.0000 ug | Freq: Once | INTRAMUSCULAR | Status: AC
  Administered 2014-12-17: 50 ug via INTRAVENOUS
  Filled 2014-12-17 (×2): qty 2
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
## 2014-12-17 NOTE — Progress Notes (Signed)
Orthopedic Tech Progress Note Patient Details:  Juan Hunt [birth year] [id number]  Ortho Devices Type of Ortho Device: Crutches Ortho Device/Splint Interventions: Application   [name] 12/17/2014, 3:44 PM
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
## 2014-12-17 NOTE — Discharge Instructions (Signed)
Cervical Sprain A cervical sprain is an injury in the neck in which the strong, fibrous tissues (ligaments) that connect your neck bones stretch or tear. Cervical sprains can range from mild to severe. Severe cervical sprains can cause the neck vertebrae to be unstable. This can lead to damage of the spinal cord and can result in serious nervous system problems. The amount of time it takes for a cervical sprain to get better depends on the cause and extent of the injury. Most cervical sprains heal in 1 to 3 weeks. CAUSES  Severe cervical sprains may be caused by:   Contact sport injuries (such as from football, rugby, wrestling, hockey, auto racing, gymnastics, diving, martial arts, or boxing).   Motor vehicle collisions.   Whiplash injuries. This is an injury from a sudden forward and backward whipping movement of the head and neck.  Falls.  Mild cervical sprains may be caused by:   Being in an awkward position, such as while cradling a telephone between your ear and shoulder.   Sitting in a chair that does not offer proper support.   Working at a poorly Landscape [employer] station.   Looking up or down for long periods of time.  SYMPTOMS   Pain, soreness, stiffness, or a burning sensation in the front, back, or sides of the neck. This discomfort may develop immediately after the injury or slowly, 24 hours or more after the injury.   Pain or tenderness directly in the middle of the back of the neck.   Shoulder or upper back pain.   Limited ability to move the neck.   Headache.   Dizziness.   Weakness, numbness, or tingling in the hands or arms.   Muscle spasms.   Difficulty swallowing or chewing.   Tenderness and swelling of the neck.  DIAGNOSIS  Most of the time your health care provider can diagnose a cervical sprain by taking your history and doing a physical exam. Your health care provider will ask about previous neck injuries and any known neck  problems, such as arthritis in the neck. X-rays may be taken to find out if there are any other problems, such as with the bones of the neck. Other tests, such as a CT scan or MRI, may also be needed.  TREATMENT  Treatment depends on the severity of the cervical sprain. Mild sprains can be treated with rest, keeping the neck in place (immobilization), and pain medicines. Severe cervical sprains are immediately immobilized. Further treatment is done to help with pain, muscle spasms, and other symptoms and may include:  Medicines, such as pain relievers, numbing medicines, or muscle relaxants.   Physical therapy. This may involve stretching exercises, strengthening exercises, and posture training. Exercises and improved posture can help stabilize the neck, strengthen muscles, and help stop symptoms from returning.  HOME CARE INSTRUCTIONS   Put ice on the injured area.   Put ice in a plastic bag.   Place a towel between your skin and the bag.   Leave the ice on for 15-20 minutes, 3-4 times a day.   If your injury was severe, you may have been given a cervical collar to wear. A cervical collar is a two-piece collar designed to keep your neck from moving while it heals.  Do not remove the collar unless instructed by your health care provider.  If you have long hair, keep it outside of the collar.  Ask your health care provider before making any adjustments to your collar. Minor  adjustments may be required over time to improve comfort and reduce pressure on your chin or on the back of your head.  Ifyou are allowed to remove the collar for cleaning or bathing, follow your health care provider's instructions on how to do so safely.  Keep your collar clean by wiping it with mild soap and water and drying it completely. If the collar you have been given includes removable pads, remove them every 1-2 days and hand wash them with soap and water. Allow them to air dry. They should be completely  dry before you wear them in the collar.  If you are allowed to remove the collar for cleaning and bathing, wash and dry the skin of your neck. Check your skin for irritation or sores. If you see any, tell your health care provider.  Do not drive while wearing the collar.   Only take over-the-counter or prescription medicines for pain, discomfort, or fever as directed by your health care provider.   Keep all follow-up appointments as directed by your health care provider.   Keep all physical therapy appointments as directed by your health care provider.   Make any needed adjustments to your workstation to promote good posture.   Avoid positions and activities that make your symptoms worse.   Warm up and stretch before being active to help prevent problems.  SEEK MEDICAL CARE IF:   Your pain is not controlled with medicine.   You are unable to decrease your pain medicine over time as planned.   Your activity level is not improving as expected.  SEEK IMMEDIATE MEDICAL CARE IF:   You develop any bleeding.  You develop stomach upset.  You have signs of an allergic reaction to your medicine.   Your symptoms get worse.   You develop new, unexplained symptoms.   You have numbness, tingling, weakness, or paralysis in any part of your body.  MAKE SURE YOU:   Understand these instructions.  Will watch your condition.  Will get help right away if you are not doing well or get worse.   This information is not intended to replace advice given to you by your health care provider. Make sure you discuss any questions you have with your health care provider.   Document Released: 10/22/2006 Document Revised: 12/30/2012 Document Reviewed: 07/02/2012 Elsevier Interactive Patient Education 2016 [place].  Blunt Chest Trauma Blunt chest trauma is an injury caused by a blow to the chest. These chest injuries can be very painful. Blunt chest trauma often results in  bruised or broken (fractured) ribs. Most cases of bruised and fractured ribs from blunt chest traumas get better after 1 to 3 weeks of rest and pain medicine. Often, the soft tissue in the chest wall is also injured, causing pain and bruising. Internal organs, such as the heart and lungs, may also be injured. Blunt chest trauma can lead to serious medical problems. This injury requires immediate medical care. CAUSES   Motor vehicle collisions.  Falls.  Physical violence.  Sports injuries. SYMPTOMS   Chest pain. The pain may be worse when you move or breathe deeply.  Shortness of breath.  Lightheadedness.  Bruising.  Tenderness.  Swelling. DIAGNOSIS  Your caregiver will do a physical exam. X-rays may be taken to look for fractures. However, minor rib fractures may not show up on X-rays until a few days after the injury. If a more serious injury is suspected, further imaging tests may be done. This may include ultrasounds, computed  tomography (CT) scans, or magnetic resonance imaging (MRI). TREATMENT  Treatment depends on the severity of your injury. Your caregiver may prescribe pain medicines and deep breathing exercises. HOME CARE INSTRUCTIONS  Limit your activities until you can move around without much pain.  Do not do any strenuous work until your injury is healed.  Put ice on the injured area.  Put ice in a plastic bag.  Place a towel between your skin and the bag.  Leave the ice on for 15-20 minutes, 03-04 times a day.  You may wear a rib belt as directed by your caregiver to reduce pain.  Practice deep breathing as directed by your caregiver to keep your lungs clear.  Only take over-the-counter or prescription medicines for pain, fever, or discomfort as directed by your caregiver. SEEK IMMEDIATE MEDICAL CARE IF:   You have increasing pain or shortness of breath.  You cough up blood.  You have nausea, vomiting, or abdominal pain.  You have a fever.  You  feel dizzy, weak, or you faint. MAKE SURE YOU:  Understand these instructions.  Will watch your condition.  Will get help right away if you are not doing well or get worse.   This information is not intended to replace advice given to you by your health care provider. Make sure you discuss any questions you have with your health care provider.   Document Released: 02/02/2004 Document Revised: 01/15/2014 Document Reviewed: 06/23/2014 Elsevier Interactive Patient Education 2016 [place].  Chest Contusion A chest contusion is a deep bruise on your chest area. Contusions are the result of an injury that caused bleeding under the skin. A chest contusion may involve bruising of the skin, muscles, or ribs. The contusion may turn blue, purple, or yellow. Minor injuries will give you a painless contusion, but more severe contusions may stay painful and swollen for a few weeks. CAUSES  A contusion is usually caused by a blow, trauma, or direct force to an area of the body. SYMPTOMS   Swelling and redness of the injured area.  Discoloration of the injured area.  Tenderness and soreness of the injured area.  Pain. DIAGNOSIS  The diagnosis can be made by taking a history and performing a physical exam. An X-ray, CT scan, or MRI may be needed to determine if there were any associated injuries, such as broken bones (fractures) or internal injuries. TREATMENT  Often, the best treatment for a chest contusion is resting, icing, and applying cold compresses to the injured area. Deep breathing exercises may be recommended to reduce the risk of pneumonia. Over-the-counter medicines may also be recommended for pain control. HOME CARE INSTRUCTIONS   Put ice on the injured area.  Put ice in a plastic bag.  Place a towel between your skin and the bag.  Leave the ice on for 15-20 minutes, 03-04 times a day.  Only take over-the-counter or prescription medicines as directed by your caregiver. Your  caregiver may recommend avoiding anti-inflammatory medicines (aspirin, ibuprofen, and naproxen) for 48 hours because these medicines may increase bruising.  Rest the injured area.  Perform deep-breathing exercises as directed by your caregiver.  Stop smoking if you smoke.  Do not lift objects over 5 pounds (2.3 kg) for 3 days or longer if recommended by your caregiver. SEEK IMMEDIATE MEDICAL CARE IF:   You have increased bruising or swelling.  You have pain that is getting worse.  You have difficulty breathing.  You have dizziness, weakness, or fainting.  You  have blood in your urine or stool.  You cough up or vomit blood.  Your swelling or pain is not relieved with medicines. MAKE SURE YOU:   Understand these instructions.  Will watch your condition.  Will get help right away if you are not doing well or get worse.   This information is not intended to replace advice given to you by your health care provider. Make sure you discuss any questions you have with your health care provider.   Document Released: 09/19/2000 Document Revised: 09/19/2011 Document Reviewed: 06/18/2011 Elsevier Interactive Patient Education 2016 [employer].  Technical brewer It is common to have multiple bruises and sore muscles after a motor vehicle collision (MVC). These tend to feel worse for the first 24 hours. You may have the most stiffness and soreness over the first several hours. You may also feel worse when you wake up the first morning after your collision. After this point, you will usually begin to improve with each day. The speed of improvement often depends on the severity of the collision, the number of injuries, and the location and nature of these injuries. HOME CARE INSTRUCTIONS  Put ice on the injured area.  Put ice in a plastic bag.  Place a towel between your skin and the bag.  Leave the ice on for 15-20 minutes, 3-4 times a day, or as directed by your health care  provider.  Drink enough fluids to keep your urine clear or pale yellow. Do not drink alcohol.  Take a warm shower or bath once or twice a day. This will increase blood flow to sore muscles.  You may return to activities as directed by your caregiver. Be careful when lifting, as this may aggravate neck or back pain.  Only take over-the-counter or prescription medicines for pain, discomfort, or fever as directed by your caregiver. Do not use aspirin. This may increase bruising and bleeding. SEEK IMMEDIATE MEDICAL CARE IF:  You have numbness, tingling, or weakness in the arms or legs.  You develop severe headaches not relieved with medicine.  You have severe neck pain, especially tenderness in the middle of the back of your neck.  You have changes in bowel or bladder control.  There is increasing pain in any area of the body.  You have shortness of breath, light-headedness, dizziness, or fainting.  You have chest pain.  You feel sick to your stomach (nauseous), throw up (vomit), or sweat.  You have increasing abdominal discomfort.  There is blood in your urine, stool, or vomit.  You have pain in your shoulder (shoulder strap areas).  You feel your symptoms are getting worse. MAKE SURE YOU:  Understand these instructions.  Will watch your condition.  Will get help right away if you are not doing well or get worse.   This information is not intended to replace advice given to you by your health care provider. Make sure you discuss any questions you have with your health care provider.   Document Released: 12/25/2004 Document Revised: 01/15/2014 Document Reviewed: 05/24/2010 Elsevier Interactive Patient Education [employer].

## 2014-12-17 NOTE — ED Notes (Signed)
Myself and Dustin, NT undressed patient, placed in gown, on monitor, continuous pulse oximetry and blood pressure cuff; patient arrived via [facility] with c-collar intact

## 2014-12-21 ENCOUNTER — Encounter: Payer: Self-pay | Admitting: Primary Care

## 2014-12-21 ENCOUNTER — Ambulatory Visit (INDEPENDENT_AMBULATORY_CARE_PROVIDER_SITE_OTHER): Payer: Federal, State, Local not specified - PPO | Admitting: Primary Care

## 2014-12-21 DIAGNOSIS — Z23 Encounter for immunization: Secondary | ICD-10-CM

## 2014-12-21 DIAGNOSIS — S134XXD Sprain of ligaments of cervical spine, subsequent encounter: Secondary | ICD-10-CM

## 2014-12-21 DIAGNOSIS — S20212D Contusion of left front wall of thorax, subsequent encounter: Secondary | ICD-10-CM | POA: Diagnosis not present

## 2014-12-21 MED ORDER — HYDROCODONE-ACETAMINOPHEN 5-325 MG PO TABS: 1.0000 | ORAL_TABLET | Freq: Four times a day (QID) | ORAL | Status: DC | PRN

## 2014-12-21 MED ORDER — METHOCARBAMOL 500 MG PO TABS
500.0000 mg | ORAL_TABLET | Freq: Three times a day (TID) | ORAL | Status: DC | PRN
Start: 2014-12-21 — End: 2015-05-05
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
## 2014-12-21 NOTE — Addendum Note (Signed)
Addended by: Jacqualin Combes on: 12/21/2014 11:45 AM   Modules accepted: Orders

## 2014-12-21 NOTE — Progress Notes (Signed)
Pre visit review using our clinic review tool, if applicable. No additional management support is needed unless otherwise documented below in the visit note. 

## 2014-12-21 NOTE — Patient Instructions (Addendum)
You will be sore over the next several days, but this will improve gradually.  Avoid sitting for a prolonged amount of time as this will cause your joints and muscles to stiffen.  Continue to ice/heat the areas of discomfort.  You may take the hydrocodone every 6-8 hours as needed for moderate to severe pain. Do not take tylenol with this medication. This medication may cause drowsiness.  You may take the methocarbamol every 8 hours as needed for muscle spasms. This medication may cause drowsiness.  Follow up with Dr. Danise Mina in February as scheduled to discuss your CT results. (i.e. Nodules on your lungs and lesion to your kidney).  It was a pleasure meeting you!

## 2014-12-23 ENCOUNTER — Ambulatory Visit: Payer: Self-pay | Admitting: Family Medicine

## 2015-02-16 ENCOUNTER — Other Ambulatory Visit: Payer: Self-pay | Admitting: Family Medicine

## 2015-02-16 DIAGNOSIS — R7303 Prediabetes: Secondary | ICD-10-CM

## 2015-02-16 DIAGNOSIS — E785 Hyperlipidemia, unspecified: Secondary | ICD-10-CM

## 2015-02-16 DIAGNOSIS — Z125 Encounter for screening for malignant neoplasm of prostate: Secondary | ICD-10-CM

## 2015-02-18 ENCOUNTER — Other Ambulatory Visit (INDEPENDENT_AMBULATORY_CARE_PROVIDER_SITE_OTHER): Payer: Federal, State, Local not specified - PPO

## 2015-02-18 DIAGNOSIS — R7303 Prediabetes: Secondary | ICD-10-CM

## 2015-02-18 DIAGNOSIS — E785 Hyperlipidemia, unspecified: Secondary | ICD-10-CM

## 2015-02-18 DIAGNOSIS — Z125 Encounter for screening for malignant neoplasm of prostate: Secondary | ICD-10-CM

## 2015-02-18 LAB — LIPID PANEL
Cholesterol: 215 mg/dL — ABNORMAL HIGH (ref 0–200)
HDL: 37.2 mg/dL — ABNORMAL LOW (ref >39.00)
LDL CALC: 160 mg/dL — AB (ref 0–99)
NONHDL: 177.51
Total CHOL/HDL Ratio: 6
Triglycerides: 87 mg/dL (ref 0.0–149.0)
VLDL: 17.4 mg/dL (ref 0.0–40.0)

## 2015-02-18 LAB — BASIC METABOLIC PANEL
BUN: 18 mg/dL (ref 6–23)
CALCIUM: 9.7 mg/dL (ref 8.4–10.5)
CO2: 32 mEq/L (ref 19–32)
Chloride: 104 mEq/L (ref 96–112)
Creatinine, Ser: 1.13 mg/dL (ref 0.40–1.50)
GFR: 83.8 mL/min (ref >60.00)
Glucose, Bld: 98 mg/dL (ref 70–99)
Potassium: 5.3 mEq/L — ABNORMAL HIGH (ref 3.5–5.1)
SODIUM: 140 meq/L (ref 135–145)

## 2015-02-18 LAB — PSA: PSA: 5.07 ng/mL — AB (ref 0.10–4.00)

## 2015-02-18 LAB — HEMOGLOBIN A1C: HEMOGLOBIN A1C: 6.1 % (ref 4.6–6.5)

## 2015-02-23 ENCOUNTER — Ambulatory Visit (INDEPENDENT_AMBULATORY_CARE_PROVIDER_SITE_OTHER): Payer: Federal, State, Local not specified - PPO | Admitting: Family Medicine

## 2015-02-23 ENCOUNTER — Encounter: Payer: Self-pay | Admitting: Family Medicine

## 2015-02-23 VITALS — BP 136/84 | HR 64 | Temp 98.0°F | Wt 224.5 lb

## 2015-02-23 DIAGNOSIS — E669 Obesity, unspecified: Secondary | ICD-10-CM

## 2015-02-23 DIAGNOSIS — E875 Hyperkalemia: Secondary | ICD-10-CM | POA: Diagnosis not present

## 2015-02-23 DIAGNOSIS — R6889 Other general symptoms and signs: Secondary | ICD-10-CM

## 2015-02-23 DIAGNOSIS — R0989 Other specified symptoms and signs involving the circulatory and respiratory systems: Secondary | ICD-10-CM

## 2015-02-23 DIAGNOSIS — Z0000 Encounter for general adult medical examination without abnormal findings: Secondary | ICD-10-CM

## 2015-02-23 DIAGNOSIS — Z1159 Encounter for screening for other viral diseases: Secondary | ICD-10-CM

## 2015-02-23 DIAGNOSIS — N402 Nodular prostate without lower urinary tract symptoms: Secondary | ICD-10-CM

## 2015-02-23 DIAGNOSIS — E785 Hyperlipidemia, unspecified: Secondary | ICD-10-CM

## 2015-02-23 DIAGNOSIS — R198 Other specified symptoms and signs involving the digestive system and abdomen: Secondary | ICD-10-CM | POA: Diagnosis not present

## 2015-02-23 DIAGNOSIS — N289 Disorder of kidney and ureter, unspecified: Secondary | ICD-10-CM

## 2015-02-23 DIAGNOSIS — K760 Fatty (change of) liver, not elsewhere classified: Secondary | ICD-10-CM | POA: Diagnosis not present

## 2015-02-23 DIAGNOSIS — R7303 Prediabetes: Secondary | ICD-10-CM

## 2015-02-23 DIAGNOSIS — R918 Other nonspecific abnormal finding of lung field: Secondary | ICD-10-CM

## 2015-02-23 DIAGNOSIS — R972 Elevated prostate specific antigen [PSA]: Secondary | ICD-10-CM

## 2015-02-23 DIAGNOSIS — N281 Cyst of kidney, acquired: Secondary | ICD-10-CM | POA: Insufficient documentation

## 2015-02-23 DIAGNOSIS — E0789 Other specified disorders of thyroid: Secondary | ICD-10-CM

## 2015-02-23 HISTORY — DX: Disorder of kidney and ureter, unspecified: N28.9

## 2015-02-23 LAB — POC URINALSYSI DIPSTICK (AUTOMATED)
BILIRUBIN UA: NEGATIVE
Blood, UA: NEGATIVE
Glucose, UA: NEGATIVE
Ketones, UA: NEGATIVE
LEUKOCYTES UA: NEGATIVE
NITRITE UA: NEGATIVE
PH UA: 6.5
Spec Grav, UA: 1.025
UROBILINOGEN UA: 0.2

## 2015-02-23 LAB — POTASSIUM: POTASSIUM: 4.6 meq/L (ref 3.5–5.1)

## 2015-02-23 LAB — HEPATIC FUNCTION PANEL
ALK PHOS: 78 U/L (ref 39–117)
ALT: 31 U/L (ref 0–53)
AST: 28 U/L (ref 0–37)
Albumin: 4.5 g/dL (ref 3.5–5.2)
Bilirubin, Direct: 0 mg/dL (ref 0.0–0.3)
Total Bilirubin: 0.3 mg/dL (ref 0.2–1.2)
Total Protein: 7.6 g/dL (ref 6.0–8.3)

## 2015-02-23 MED ORDER — ASPIRIN EC 81 MG PO TBEC: 81.0000 mg | DELAYED_RELEASE_TABLET | Freq: Every day | ORAL | Status: DC

## 2015-02-23 NOTE — Assessment & Plan Note (Addendum)
Likely GERD related. Given some thyroid discomfort and fullness to palpation - check thyroid US. If normal, rec restart PPI daily.

## 2015-02-23 NOTE — Patient Instructions (Addendum)
Let's check MRI of right kidney to follow spot seen on CT and thyroid ultrasound. Urinalysis today. Blood work today. Cholesterol too high - work on low cholesterol diet and mediterranean diet over next 3-6 months then return for repeat labs. Start aspirin 81mg  enteric coated today. Prostate level slightly elevated - recheck and refer to urology. Return in 4 months for labs and afterwards office visit.      Mediterranean Diet  Why follow it? Research shows. . Those who follow the Mediterranean diet have a reduced risk of heart disease  . The diet is associated with a reduced incidence of Parkinson's and Alzheimer's diseases . People following the diet may have longer life expectancies and lower rates of chronic diseases  . The Dietary Guidelines for Americans recommends the Mediterranean diet as an eating plan to promote health and prevent disease  What Is the Mediterranean Diet?  . Healthy eating plan based on typical foods and recipes of Mediterranean-style cooking . The diet is primarily a plant based diet; these foods should make up a majority of meals   Starches - Plant based foods should make up a majority of meals - They are an important sources of vitamins, minerals, energy, antioxidants, and fiber - Choose whole grains, foods high in fiber and minimally processed items  - Typical grain sources include wheat, oats, barley, corn, brown rice, bulgar, farro, millet, polenta, couscous  - Various types of beans include chickpeas, lentils, fava beans, black beans, white beans   Fruits  Veggies - Large quantities of antioxidant rich fruits & veggies; 6 or more servings  - Vegetables can be eaten raw or lightly drizzled with oil and cooked  - Vegetables common to the traditional Mediterranean Diet include: artichokes, arugula, beets, broccoli, brussel sprouts, cabbage, carrots, celery, collard greens, cucumbers, eggplant, kale, leeks, lemons, lettuce, mushrooms, okra, onions, peas,  peppers, potatoes, pumpkin, radishes, rutabaga, shallots, spinach, sweet potatoes, turnips, zucchini - Fruits common to the Mediterranean Diet include: apples, apricots, avocados, cherries, clementines, dates, figs, grapefruits, grapes, melons, nectarines, oranges, peaches, pears, pomegranates, strawberries, tangerines  Fats - Replace butter and margarine with healthy oils, such as olive oil, canola oil, and tahini  - Limit nuts to no more than a handful a day  - Nuts include walnuts, almonds, pecans, pistachios, pine nuts  - Limit or avoid candied, honey roasted or heavily salted nuts - Olives are central to the [employer] - can be eaten whole or used in a variety of dishes   Meats Protein - Limiting red meat: no more than a few times a month - When eating red meat: choose lean cuts and keep the portion to the size of deck of cards - Eggs: approx. 0 to 4 times a week  - Fish and lean poultry: at least 2 a week  - Healthy protein sources include, chicken, [place], lean beef, lamb - Increase intake of seafood such as tuna, salmon, trout, mackerel, shrimp, scallops - Avoid or limit high fat processed meats such as sausage and bacon  Dairy - Include moderate amounts of low fat dairy products  - Focus on healthy dairy such as fat free yogurt, skim milk, low or reduced fat cheese - Limit dairy products higher in fat such as whole or 2% milk, cheese, ice cream  Alcohol - Moderate amounts of red wine is ok  - No more than 5 oz daily for women (all ages) and men older than age 49  - No more than 10 oz of  wine daily for men younger than 63  Other - Limit sweets and other desserts  - Use herbs and spices instead of salt to flavor foods  - Herbs and spices common to the traditional Mediterranean Diet include: basil, bay leaves, chives, cloves, cumin, fennel, garlic, lavender, marjoram, mint, oregano, parsley, pepper, rosemary, sage, savory, sumac, tarragon, thyme   It's not just a diet, it's a  lifestyle:  . The Mediterranean diet includes lifestyle factors typical of those in the region  . Foods, drinks and meals are best eaten with others and savored . Daily physical activity is important for overall good health . This could be strenuous exercise like running and aerobics . This could also be more leisurely activities such as walking, housework, yard-work, or taking the stairs . Moderation is the key; a balanced and healthy diet accommodates most foods and drinks . Consider portion sizes and frequency of consumption of certain foods   Meal Ideas & Options:  . Breakfast:  o Whole wheat toast or whole wheat English muffins with peanut butter & hard boiled egg o Steel cut oats topped with apples & cinnamon and skim milk  o Fresh fruit: banana, strawberries, melon, berries, peaches  o Smoothies: strawberries, bananas, greek yogurt, peanut butter o Low fat greek yogurt with blueberries and granola  o Egg white omelet with spinach and mushrooms o Breakfast couscous: whole wheat couscous, apricots, skim milk, cranberries  . Sandwiches:  o Hummus and grilled vegetables (peppers, zucchini, squash) on whole wheat bread   o Grilled chicken on whole wheat pita with lettuce, tomatoes, cucumbers or tzatziki  o Tuna salad on whole wheat bread: tuna salad made with greek yogurt, olives, red peppers, capers, green onions o Garlic rosemary lamb pita: lamb sauted with garlic, rosemary, salt & pepper; add lettuce, cucumber, greek yogurt to pita - flavor with lemon juice and black pepper  . Seafood:  o Mediterranean grilled salmon, seasoned with garlic, basil, parsley, lemon juice and black pepper o Shrimp, lemon, and spinach whole-grain pasta salad made with low fat greek yogurt  o Seared scallops with lemon orzo  o Seared tuna steaks seasoned salt, pepper, coriander topped with tomato mixture of olives, tomatoes, olive oil, minced garlic, parsley, green onions and cappers  . Meats:  o Herbed  greek chicken salad with kalamata olives, cucumber, feta  o Red bell peppers stuffed with spinach, bulgur, lean ground beef (or lentils) & topped with feta   o Kebabs: skewers of chicken, tomatoes, onions, zucchini, squash  o [place] burgers: made with red onions, mint, dill, lemon juice, feta cheese topped with roasted red peppers . Vegetarian o Cucumber salad: cucumbers, artichoke hearts, celery, red onion, feta cheese, tossed in olive oil & lemon juice  o Hummus and whole grain pita points with a greek salad (lettuce, tomato, feta, olives, cucumbers, red onion) o Lentil soup with celery, carrots made with vegetable broth, garlic, salt and pepper  o Tabouli salad: parsley, bulgur, mint, scallions, cucumbers, tomato, radishes, lemon juice, olive oil, salt and pepper.   Health Maintenance, Male A healthy lifestyle and preventative care can promote health and wellness.  Maintain regular health, dental, and eye exams.  Eat a healthy diet. Foods like vegetables, fruits, whole grains, low-fat dairy products, and lean protein foods contain the nutrients you need and are low in calories. Decrease your intake of foods high in solid fats, added sugars, and salt. Get information about a proper diet from your health care provider, if necessary.  Regular physical exercise is one of the most important things you can do for your health. Most adults should get at least 150 minutes of moderate-intensity exercise (any activity that increases your heart rate and causes you to sweat) each week. In addition, most adults need muscle-strengthening exercises on 2 or more days a week.   Maintain a healthy weight. The body mass index (BMI) is a screening tool to identify possible weight problems. It provides an estimate of body fat based on height and weight. Your health care provider can find your BMI and can help you achieve or maintain a healthy weight. For males 20 years and older:  A BMI below 18.5 is considered  underweight.  A BMI of 18.5 to 24.9 is normal.  A BMI of 25 to 29.9 is considered overweight.  A BMI of 30 and above is considered obese.  Maintain normal blood lipids and cholesterol by exercising and minimizing your intake of saturated fat. Eat a balanced diet with plenty of fruits and vegetables. Blood tests for lipids and cholesterol should begin at age 33 and be repeated every 5 years. If your lipid or cholesterol levels are high, you are over age 1, or you are at high risk for heart disease, you may need your cholesterol levels checked more frequently.Ongoing high lipid and cholesterol levels should be treated with medicines if diet and exercise are not working.  If you smoke, find out from your health care provider how to quit. If you do not use tobacco, do not start.  Lung cancer screening is recommended for adults aged 10-80 years who are at high risk for developing lung cancer because of a history of smoking. A yearly low-dose CT scan of the lungs is recommended for people who have at least a 30-pack-year history of smoking and are current smokers or have quit within the past 15 years. A pack year of smoking is smoking an average of 1 pack of cigarettes a day for 1 year (for example, a 30-pack-year history of smoking could mean smoking 1 pack a day for 30 years or 2 packs a day for 15 years). Yearly screening should continue until the smoker has stopped smoking for at least 15 years. Yearly screening should be stopped for people who develop a health problem that would prevent them from having lung cancer treatment.  If you choose to drink alcohol, do not have more than 2 drinks per day. One drink is considered to be 12 oz (360 mL) of beer, 5 oz (150 mL) of wine, or 1.5 oz (45 mL) of liquor.  Avoid the use of street drugs. Do not share needles with anyone. Ask for help if you need support or instructions about stopping the use of drugs.  High blood pressure causes heart disease and  increases the risk of stroke. High blood pressure is more likely to develop in:  People who have blood pressure in the end of the normal range (100-139/85-89 mm Hg).  People who are overweight or obese.  People who are African American.  If you are 2-51 years of age, have your blood pressure checked every 3-5 years. If you are 58 years of age or older, have your blood pressure checked every year. You should have your blood pressure measured twice--once when you are at a hospital or clinic, and once when you are not at a hospital or clinic. Record the average of the two measurements. To check your blood pressure when you are not at a  hospital or clinic, you can use:  An automated blood pressure machine at a pharmacy.  A home blood pressure monitor.  If you are 64-29 years old, ask your health care provider if you should take aspirin to prevent heart disease.  Diabetes screening involves taking a blood sample to check your fasting blood sugar level. This should be done once every 3 years after age 72 if you are at a normal weight and without risk factors for diabetes. Testing should be considered at a younger age or be carried out more frequently if you are overweight and have at least 1 risk factor for diabetes.  Colorectal cancer can be detected and often prevented. Most routine colorectal cancer screening begins at the age of 40 and continues through age 62. However, your health care provider may recommend screening at an earlier age if you have risk factors for colon cancer. On a yearly basis, your health care provider may provide home test kits to check for hidden blood in the stool. A small camera at the end of a tube may be used to directly examine the colon (sigmoidoscopy or colonoscopy) to detect the earliest forms of colorectal cancer. Talk to your health care provider about this at age 77 when routine screening begins. A direct exam of the colon should be repeated every 5-10 years through  age 2, unless early forms of precancerous polyps or small growths are found.  People who are at an increased risk for hepatitis B should be screened for this virus. You are considered at high risk for hepatitis B if:  You were born in a country where hepatitis B occurs often. Talk with your health care provider about which countries are considered high risk.  Your parents were born in a high-risk country and you have not received a shot to protect against hepatitis B (hepatitis B vaccine).  You have HIV or AIDS.  You use needles to inject street drugs.  You live with, or have sex with, someone who has hepatitis B.  You are a man who has sex with other men (MSM).  You get hemodialysis treatment.  You take certain medicines for conditions like cancer, organ transplantation, and autoimmune conditions.  Hepatitis C blood testing is recommended for all people born from [birth year] through 1965 and any individual with known risk factors for hepatitis C.  Healthy men should no longer receive prostate-specific antigen (PSA) blood tests as part of routine cancer screening. Talk to your health care provider about prostate cancer screening.  Testicular cancer screening is not recommended for adolescents or adult males who have no symptoms. Screening includes self-exam, a health care provider exam, and other screening tests. Consult with your health care provider about any symptoms you have or any concerns you have about testicular cancer.  Practice safe sex. Use condoms and avoid high-risk sexual practices to reduce the spread of sexually transmitted infections (STIs).  You should be screened for STIs, including gonorrhea and chlamydia if:  You are sexually active and are younger than 24 years.  You are older than 24 years, and your health care provider tells you that you are at risk for this type of infection.  Your sexual activity has changed since you were last screened, and you are at an  increased risk for chlamydia or gonorrhea. Ask your health care provider if you are at risk.  If you are at risk of being infected with HIV, it is recommended that you take a prescription medicine daily to  prevent HIV infection. This is called pre-exposure prophylaxis (PrEP). You are considered at risk if:  You are a man who has sex with other men (MSM).  You are a heterosexual man who is sexually active with multiple partners.  You take drugs by injection.  You are sexually active with a partner who has HIV.  Talk with your health care provider about whether you are at high risk of being infected with HIV. If you choose to begin PrEP, you should first be tested for HIV. You should then be tested every 3 months for as long as you are taking PrEP.  Use sunscreen. Apply sunscreen liberally and repeatedly throughout the day. You should seek shade when your shadow is shorter than you. Protect yourself by wearing long sleeves, pants, a wide-brimmed hat, and sunglasses year round whenever you are outdoors.  Tell your health care provider of new moles or changes in moles, especially if there is a change in shape or color. Also, tell your health care provider if a mole is larger than the size of a pencil eraser.  A one-time screening for abdominal aortic aneurysm (AAA) and surgical repair of large AAAs by ultrasound is recommended for men aged 61-75 years who are current or former smokers.  Stay current with your vaccines (immunizations).   This information is not intended to replace advice given to you by your health care provider. Make sure you discuss any questions you have with your health care provider.   Document Released: 06/23/2007 Document Revised: 01/15/2014 Document Reviewed: 05/22/2010 Elsevier Interactive Patient Education [employer].

## 2015-02-23 NOTE — Assessment & Plan Note (Signed)
Reviewed recent hospitalization and treatment plan to date. Slowed recovery. Continue f/u with ortho.

## 2015-02-23 NOTE — Addendum Note (Signed)
Addended by: Royann Shivers A on: 02/23/2015 10:54 AM   Modules accepted: Orders

## 2015-02-23 NOTE — Progress Notes (Signed)
Pre visit review using our clinic review tool, if applicable. No additional management support is needed unless otherwise documented below in the visit note. 

## 2015-02-23 NOTE — Assessment & Plan Note (Signed)
Preventative protocols reviewed and updated unless pt declined. Discussed healthy diet and lifestyle.  

## 2015-02-23 NOTE — Assessment & Plan Note (Signed)
All <50mm, not at risk for bronchogenic cancer (quit smoking >15 yrs ago), no f/u planned
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
## 2015-02-23 NOTE — Assessment & Plan Note (Signed)
Encouraged avoiding added sugars/sweetened beverages

## 2015-02-23 NOTE — Assessment & Plan Note (Signed)
Check limited kidney MRI with/without contrast

## 2015-02-23 NOTE — Assessment & Plan Note (Signed)
Check free/total PSA today. Refer to urology given palpated possible prostate nodule today.

## 2015-02-23 NOTE — Addendum Note (Signed)
Addended by: Ria Bush on: 02/23/2015 12:38 PM   Modules accepted: Orders, SmartSet

## 2015-02-23 NOTE — Assessment & Plan Note (Signed)
Check LFTs today.  Reviewed recent CT dx with patient. Encouraged weight loss and better chol control

## 2015-02-23 NOTE — Assessment & Plan Note (Signed)
Discussed healthy lifestyle/diet changes to affect sustainable weight loss

## 2015-02-23 NOTE — Assessment & Plan Note (Signed)
Chronic, rec low chol and mediterranean diet over next 4 mo then recheck levels. Goal LDL <100 daily. rec start aspirin daily given prior elevated [place]

## 2015-02-24 LAB — PSA, TOTAL AND FREE
PSA, Free Pct: 16 % — ABNORMAL LOW (ref >25)
PSA, Free: 0.94 ng/mL
PSA: 5.87 ng/mL — ABNORMAL HIGH (ref <=4.00)

## 2015-02-24 LAB — HEPATITIS C ANTIBODY: HCV Ab: NEGATIVE

## 2015-02-28 ENCOUNTER — Ambulatory Visit (INDEPENDENT_AMBULATORY_CARE_PROVIDER_SITE_OTHER): Payer: Federal, State, Local not specified - PPO | Admitting: Urology

## 2015-02-28 ENCOUNTER — Encounter: Payer: Self-pay | Admitting: Urology

## 2015-02-28 VITALS — BP 148/77 | HR 63 | Ht 70.0 in | Wt 226.0 lb

## 2015-02-28 DIAGNOSIS — N529 Male erectile dysfunction, unspecified: Secondary | ICD-10-CM

## 2015-02-28 DIAGNOSIS — N528 Other male erectile dysfunction: Secondary | ICD-10-CM | POA: Diagnosis not present

## 2015-02-28 DIAGNOSIS — R972 Elevated prostate specific antigen [PSA]: Secondary | ICD-10-CM

## 2015-02-28 DIAGNOSIS — N138 Other obstructive and reflux uropathy: Secondary | ICD-10-CM | POA: Insufficient documentation

## 2015-02-28 DIAGNOSIS — N402 Nodular prostate without lower urinary tract symptoms: Secondary | ICD-10-CM | POA: Diagnosis not present

## 2015-02-28 DIAGNOSIS — N401 Enlarged prostate with lower urinary tract symptoms: Secondary | ICD-10-CM | POA: Diagnosis not present

## 2015-02-28 NOTE — Progress Notes (Addendum)
02/28/2015 2:19 PM   [name] 18-Apr-1950 [id number]  Referring provider: Ria Bush, MD [address] [place], [place] [address]  Chief Complaint  Patient presents with  . Elevated PSA    New Patient    HPI: Patient is a 65 year old African American male who is referred to [place] by his PCP, Dr. Danise Mina, for an elevated PSA and prostate nodule.  Elevated PSA Patient's PSA had risen from 3.87 ng/mL one year ago to 5.07 ng/mL a few days ago.  His father was diagnosed at age 63 with prostate cancer and underwent a RRP.  His father is still living at age 56 and independent.  Prostate nodule A possible right posterior lobe nodule was present on 02/23/2015 exam with PCP.  BPH WITH LUTS His IPSS score today is 9, which is moderate lower urinary tract symptomatology. He is pleased with his quality life due to his urinary symptoms.   He denies any dysuria, hematuria or suprapubic pain.   He also denies any recent fevers, chills, nausea or vomiting.  He has a family history of PCa, with his father being diagnosed with prostate cancer in his 40's.         IPSS      02/28/15 1300       International Prostate Symptom Score   How often have you had the sensation of not emptying your bladder? Less than 1 in 5     How often have you had to urinate less than every two hours? Less than half the time     How often have you found you stopped and started again several times when you urinated? Less than 1 in 5 times     How often have you found it difficult to postpone urination? Less than half the time     How often have you had a weak urinary stream? Less than half the time     How often have you had to strain to start urination? Not at All     How many times did you typically get up at night to urinate? 1 Time     Total IPSS Score 9     Quality of Life due to urinary symptoms   If you were to spend the rest of your life with your urinary condition just the way it is now how  would you feel about that? Pleased        Score:  1-7 Mild 8-19 Moderate 20-35 Severe  Erectile dysfunction His SHIM score is 14, which is mild to moderate ED.   He has been having difficulty with erections for the last few years.   His major complaint is maintaining.  His libido is preserved.   His risk factors for ED are age, HLD, DM and BPH.  He denies any painful erections or curvatures with his erections.   He has tried Cialis in the past with satisfactory results.        SHIM      02/28/15 1354       SHIM: Over the last 6 months:   How do you rate your confidence that you could get and keep an erection? Moderate     When you had erections with sexual stimulation, how often were your erections hard enough for penetration (entering your partner)? Sometimes (about half the time)     During sexual intercourse, how often were you able to maintain your erection after you had penetrated (entered) your  partner? Very Difficult     During sexual intercourse, how difficult was it to maintain your erection to completion of intercourse? Difficult     When you attempted sexual intercourse, how often was it satisfactory for you? Difficult     SHIM Total Score   SHIM 14        Score: 1-7 Severe ED 8-11 Moderate ED 12-16 Mild-Moderate ED 17-21 Mild ED 22-25 No ED   PMH: Past Medical History  Diagnosis Date  . Arthritis     hands  . Erectile dysfunction   . Prediabetes 02/13/2014  . Hyperlipidemia 02/13/2014  . GERD (gastroesophageal reflux disease)     Surgical History: Past Surgical History  Procedure Laterality Date  . Nasal septum surgery  2007  . Colonoscopy  2002    WNL  . Colonoscopy  04/2013    2 TA, mod diverticulosis, rpt 5 yrs (Pyrtle)    Home Medications:    Medication List       This list is accurate as of: 02/28/15  2:19 PM.  Always use your most recent med list.               aspirin EC 81 MG tablet  Take 1 tablet (81 mg total) by mouth daily.      BENEFIBER Powd  Take 1 scoop by mouth daily.     meloxicam 15 MG tablet  Commonly known as:  MOBIC  Take 15 mg by mouth daily. Reported on 02/28/2015     methocarbamol 500 MG tablet  Commonly known as:  ROBAXIN  Take 1-2 tablets (500-1,000 mg total) by mouth every 8 (eight) hours as needed for muscle spasms.     multivitamin tablet  Take 1 tablet by mouth daily.     OMEGA 3 PO  Take 1 capsule by mouth daily.     tadalafil 20 MG tablet  Commonly known as:  CIALIS  Take 1 tablet (20 mg total) by mouth daily as needed for erectile dysfunction.        Allergies: No Known Allergies  Family History: Family History  Problem Relation Age of Onset  . Stroke Mother   . Hypertension Mother   . Colon polyps Father 55    s/p surgery  . Diabetes Brother   . Colon cancer Neg Hx   . CAD Neg Hx   . Prostate cancer Father   . Bladder Cancer Neg Hx   . Kidney cancer Neg Hx     Social History:  reports that he quit smoking about 18 years ago. He has never used smokeless tobacco. He reports that he does not drink alcohol or use illicit drugs.  ROS: UROLOGY Frequent Urination?: No Hard to postpone urination?: No Burning/pain with urination?: No Get up at night to urinate?: Yes Leakage of urine?: Yes Urine stream starts and stops?: Yes Trouble starting stream?: No Do you have to strain to urinate?: No Blood in urine?: No Urinary tract infection?: No Sexually transmitted disease?: No Injury to kidneys or bladder?: No Painful intercourse?: No Weak stream?: Yes Erection problems?: Yes Penile pain?: No  Gastrointestinal Nausea?: No Vomiting?: No Indigestion/heartburn?: Yes Diarrhea?: No Constipation?: Yes  Constitutional Fever: No Night sweats?: No Weight loss?: No Fatigue?: Yes  Skin Skin rash/lesions?: No Itching?: No  Eyes Blurred vision?: Yes Double vision?: No  Ears/Nose/Throat Sore throat?: No Sinus problems?: Yes  Hematologic/Lymphatic Swollen  glands?: No Easy bruising?: No  Cardiovascular Leg swelling?: No Chest pain?: No  Respiratory Cough?: No  Shortness of breath?: Yes  Endocrine Excessive thirst?: No  Musculoskeletal Back pain?: Yes Joint pain?: Yes  Neurological Headaches?: No Dizziness?: No  Psychologic Depression?: No Anxiety?: No  Physical Exam: BP 148/77 mmHg  Pulse 63  Ht 5\' 10"  (1.778 m)  Wt 226 lb (102.513 kg)  BMI 32.43 kg/m2  Constitutional: Well nourished. Alert and oriented, No acute distress. HEENT: [place] AT, moist mucus membranes. Trachea midline, no masses. Cardiovascular: No clubbing, cyanosis, or edema. Respiratory: Normal respiratory effort, no increased work of breathing. GI: Abdomen is soft, non tender, non distended, no abdominal masses. Liver and spleen not palpable.  No hernias appreciated.  Stool sample for occult testing is not indicated.   GU: No CVA tenderness.  No bladder fullness or masses.  Patient with uncircumcised phallus. Foreskin easily retracted  Urethral meatus is patent.  No penile discharge. No penile lesions or rashes. Scrotum without lesions, cysts, rashes and/or edema.  Testicles are located scrotally bilaterally. No masses are appreciated in the testicles. Left and right epididymis are normal. Rectal: Patient with  normal sphincter tone. Anus and perineum without scarring or rashes. No rectal masses are appreciated. Prostate is approximately 50 grams, (could not palpated entire gland due to buttocks tissue), no nodules are appreciated. Seminal vesicles are normal. Skin: No rashes, bruises or suspicious lesions. Lymph: No cervical or inguinal adenopathy. Neurologic: Grossly intact, no focal deficits, moving all 4 extremities. Psychiatric: Normal mood and affect.  Laboratory Data: Lab Results  Component Value Date   WBC 3.2* 12/17/2014   HGB 13.8 12/17/2014   HCT 42.6 12/17/2014   MCV 81.9 12/17/2014   PLT 171 12/17/2014    Lab Results  Component Value Date     CREATININE 1.13 02/18/2015    Lab Results  Component Value Date   PSA 5.87* 02/23/2015   PSA 5.07* 02/18/2015   PSA 3.87 02/16/2014   Lab Results  Component Value Date   HGBA1C 6.1 02/18/2015   Lab Results  Component Value Date   TSH 2.760 08/11/2011      Component Value Date/Time   CHOL 215* 02/18/2015 0811   HDL 37.20* 02/18/2015 0811   CHOLHDL 6 02/18/2015 0811   VLDL 17.4 02/18/2015 0811   LDLCALC 160* 02/18/2015 0811   Lab Results  Component Value Date   AST 28 02/23/2015   Lab Results  Component Value Date   ALT 31 02/23/2015     Assessment & Plan:    1. Elevated PSA:   Patient's most recent PSA was 5.07 ng/mL on 02/18/2015.  This is a significant elevation from last year at 3.87 ng/mL.  I have repeated the PSA today.  If it remains elevated, we will pursue a biopsy.  If it returns to baseline, we will have him RTC in 6 months for an exam, PSA and IPSS score.  - PSA  2. Prostate nodule:   I could not appreciate the nodule on today's exam, but I could not palpate the entire prostate.  Follow up will be pending PSA results.    3. BPH with LUTS:   IPSS score is 9/1.  We will continue to monitor.  RTC pending PSA results.  4. Erectile dysfunction:   SHIM score is 14.  He is having satisfactory results with Cialis.  We will continue to monitor.  He will RTC pending PSA results.  5. Right renal mass:   Patient has an upcoming MRI.    Return for pending labs.  These notes generated with voice recognition  software. I apologize for typographical errors.  Zara Council, PA-C  [address], [place], [place] [address] [phone]  Addendum:  Patient's PSA did not decrease.  He is scheduled for a biopsy on 03/21/2015.  He also underwent a MRI for a right renal mass by Dr. Bosie Clos office.  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  He will need follow up imaging.  The patient will let us know if he would like [place] to follow  the lesion or Dr. Danise Mina.

## 2015-03-01 ENCOUNTER — Telehealth: Payer: Self-pay

## 2015-03-01 ENCOUNTER — Ambulatory Visit: Payer: No Typology Code available for payment source

## 2015-03-01 LAB — PSA: Prostate Specific Ag, Serum: 5.6 ng/mL — ABNORMAL HIGH (ref 0.0–4.0)

## 2015-03-01 NOTE — Telephone Encounter (Signed)
Spoke with pt in reference to PSA and prostate bx. Pt stated that he spoke with Larene Beach around lunch time and prostate bx was set up. Nurse found that pt was started on ASA by his PCP therefore clearance will be obtained prior to bx.

## 2015-03-01 NOTE — Telephone Encounter (Signed)
LMOM

## 2015-03-01 NOTE — Telephone Encounter (Signed)
-----   Message from Nori Riis, PA-C sent at 03/01/2015  1:09 PM EST ----- Please notify the patient that the PSA did not return to baseline.  I suggest that he undergo a biopsy of his prostate.  I have called and left a message on his voicemail to return my phone call.

## 2015-03-03 ENCOUNTER — Other Ambulatory Visit: Payer: Self-pay | Admitting: Family Medicine

## 2015-03-03 ENCOUNTER — Ambulatory Visit
Admission: RE | Admit: 2015-03-03 | Discharge: 2015-03-03 | Disposition: A | Discharge status: Home / Self Care | Payer: Federal, State, Local not specified - PPO | Source: Ambulatory Visit | Attending: Family Medicine | Admitting: Family Medicine

## 2015-03-03 DIAGNOSIS — E0789 Other specified disorders of thyroid: Secondary | ICD-10-CM

## 2015-03-03 DIAGNOSIS — N289 Disorder of kidney and ureter, unspecified: Secondary | ICD-10-CM

## 2015-03-03 DIAGNOSIS — T1590XA Foreign body on external eye, part unspecified, unspecified eye, initial encounter: Secondary | ICD-10-CM

## 2015-03-03 DIAGNOSIS — R6889 Other general symptoms and signs: Secondary | ICD-10-CM

## 2015-03-03 DIAGNOSIS — R0989 Other specified symptoms and signs involving the circulatory and respiratory systems: Secondary | ICD-10-CM

## 2015-03-03 MED ORDER — GADOBENATE DIMEGLUMINE 529 MG/ML IV SOLN
20.0000 mL | Freq: Once | INTRAVENOUS | Status: AC | PRN
Start: 2015-03-03 — End: 2015-03-03
  Administered 2015-03-03: 20 mL via INTRAVENOUS

## 2015-03-06 ENCOUNTER — Encounter: Payer: Self-pay | Admitting: Family Medicine

## 2015-03-08 ENCOUNTER — Telehealth: Payer: Self-pay

## 2015-03-08 NOTE — Telephone Encounter (Signed)
LMOM

## 2015-03-08 NOTE — Telephone Encounter (Signed)
-----   Message from Nori Riis, PA-C sent at 03/07/2015 11:33 AM EST ----- Patient has an upcoming appointment for a biopsy on the 13th of March.  We would be happy to follow the lesion for the patient.  He can let us know at the time of his biopsy what he decides.

## 2015-03-09 NOTE — Telephone Encounter (Signed)
Patient returned the call.  He confirmed that he has NOT received the results from his MRI that was ordered by Dr Danise Mina.  I advised him that he should contact Dr. Bosie Clos office to obtain results from his MRI.

## 2015-03-15 ENCOUNTER — Telehealth: Payer: Self-pay

## 2015-03-15 NOTE — Telephone Encounter (Signed)
Prostate bx clearance has been obtained. Records in Media tab. Pt made aware.

## 2015-03-21 ENCOUNTER — Other Ambulatory Visit: Payer: No Typology Code available for payment source

## 2015-03-29 ENCOUNTER — Ambulatory Visit: Payer: No Typology Code available for payment source

## 2015-04-05 ENCOUNTER — Other Ambulatory Visit: Payer: No Typology Code available for payment source | Admitting: Urology

## 2015-04-14 ENCOUNTER — Ambulatory Visit: Payer: No Typology Code available for payment source

## 2015-05-05 ENCOUNTER — Encounter: Payer: Self-pay | Admitting: Urology

## 2015-05-05 ENCOUNTER — Other Ambulatory Visit: Payer: Self-pay | Admitting: Urology

## 2015-05-05 ENCOUNTER — Ambulatory Visit (INDEPENDENT_AMBULATORY_CARE_PROVIDER_SITE_OTHER): Payer: PPO | Admitting: Urology

## 2015-05-05 VITALS — BP 134/85 | HR 57 | Ht 70.0 in | Wt 220.6 lb

## 2015-05-05 DIAGNOSIS — C61 Malignant neoplasm of prostate: Secondary | ICD-10-CM | POA: Diagnosis not present

## 2015-05-05 DIAGNOSIS — R972 Elevated prostate specific antigen [PSA]: Secondary | ICD-10-CM | POA: Diagnosis not present

## 2015-05-05 DIAGNOSIS — N281 Cyst of kidney, acquired: Secondary | ICD-10-CM

## 2015-05-05 DIAGNOSIS — Q6100 Congenital renal cyst, unspecified: Secondary | ICD-10-CM | POA: Diagnosis not present

## 2015-05-05 DIAGNOSIS — N400 Enlarged prostate without lower urinary tract symptoms: Secondary | ICD-10-CM

## 2015-05-05 DIAGNOSIS — N4232 Atypical small acinar proliferation of prostate: Secondary | ICD-10-CM | POA: Diagnosis not present

## 2015-05-05 DIAGNOSIS — N529 Male erectile dysfunction, unspecified: Secondary | ICD-10-CM

## 2015-05-05 DIAGNOSIS — N402 Nodular prostate without lower urinary tract symptoms: Secondary | ICD-10-CM | POA: Diagnosis not present

## 2015-05-05 MED ORDER — GENTAMICIN SULFATE 40 MG/ML IJ SOLN
80.0000 mg | Freq: Once | INTRAMUSCULAR | Status: AC
  Administered 2015-05-05: 80 mg via INTRAMUSCULAR

## 2015-05-05 MED ORDER — LEVOFLOXACIN 500 MG PO TABS
500.0000 mg | ORAL_TABLET | Freq: Once | ORAL | Status: AC
  Administered 2015-05-05: 500 mg via ORAL
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
## 2015-05-12 ENCOUNTER — Ambulatory Visit: Payer: PPO | Admitting: Urology

## 2015-05-13 LAB — PATHOLOGY REPORT

## 2015-05-19 ENCOUNTER — Ambulatory Visit (INDEPENDENT_AMBULATORY_CARE_PROVIDER_SITE_OTHER): Payer: PPO | Admitting: Urology

## 2015-05-19 ENCOUNTER — Encounter: Payer: Self-pay | Admitting: Urology

## 2015-05-19 VITALS — BP 146/79 | HR 67 | Ht 70.0 in | Wt 223.4 lb

## 2015-05-19 DIAGNOSIS — C61 Malignant neoplasm of prostate: Secondary | ICD-10-CM

## 2015-05-19 DIAGNOSIS — N400 Enlarged prostate without lower urinary tract symptoms: Secondary | ICD-10-CM | POA: Diagnosis not present

## 2015-05-19 NOTE — Progress Notes (Signed)
05/19/2015 3:30 PM   Juan Hunt September 22, 1950 [id number]  Referring provider: Ria Bush, MD [address] [place], [place] [address]  Chief Complaint  Patient presents with  . Biopsy    prostate biopsy     HPI: Prostate Cancer The patient has a new diagnosis of Gleason 3+3 = 6 prostate cancer. It 5 out of 12 cores positive on the right side. Findings range from 9% to 50%. He also had suspicious areas of atypia and PI on the left side.  PSA: 5.87 Patient's PSA had risen from 3.87 ng/mL one year ago to 5.07 ng/mL a few days ago. Repeat PSA 5.87. His father was diagnosed at age 68 with prostate cancer and underwent a RRP. His father is still living at age 70 and independent.   BPH WITH LUTS His IPSS score today is 9, which is moderate lower urinary tract symptomatology. He is pleased with his quality life due to his urinary symptoms. He denies any dysuria, hematuria or suprapubic pain. He also denies any recent fevers, chills, nausea or vomiting. He has a family history of PCa, with his father being diagnosed with prostate cancer in his 63's.   Right renal Bosniak 18F 2.2 cm cyst Due for repeat MRI in 6 months   PMH: Past Medical History  Diagnosis Date  . Arthritis     hands  . Erectile dysfunction   . Prediabetes 02/13/2014  . Hyperlipidemia 02/13/2014  . GERD (gastroesophageal reflux disease)   . Renal cyst, acquired, right 02/23/2015    Bosniak 18F 2.2x1.3 cm R renal cyst - rec rpt MRI w/ w/o contrast at 96mo, 62mo then yearly x5 yrs     Surgical History: Past Surgical History  Procedure Laterality Date  . Nasal septum surgery  2007  . Colonoscopy  2002    WNL  . Colonoscopy  04/2013    2 TA, mod diverticulosis, rpt 5 yrs (Pyrtle)    Home Medications:    Medication List       This list is accurate as of: 05/19/15  3:30 PM.  Always use your most recent med list.               aspirin EC 81 MG tablet  Take 1 tablet (81 mg total) by mouth  daily.     BENEFIBER Powd  Take 1 scoop by mouth daily.     meloxicam 15 MG tablet  Commonly known as:  MOBIC  Take 15 mg by mouth daily. Reported on 02/28/2015     multivitamin tablet  Take 1 tablet by mouth daily.     OMEGA 3 PO  Take 1 capsule by mouth daily.     tadalafil 20 MG tablet  Commonly known as:  CIALIS  Take 1 tablet (20 mg total) by mouth daily as needed for erectile dysfunction.        Allergies: No Known Allergies  Family History: Family History  Problem Relation Age of Onset  . Stroke Mother   . Hypertension Mother   . Colon polyps Father 34    s/p surgery  . Diabetes Brother   . Colon cancer Neg Hx   . CAD Neg Hx   . Prostate cancer Father   . Bladder Cancer Neg Hx   . Kidney cancer Neg Hx     Social History:  reports that he quit smoking about 18 years ago. He has never used smokeless tobacco. He reports that he does not drink alcohol or use  illicit drugs.  ROS:                                        Physical Exam: BP 146/79 mmHg  Pulse 67  Ht 5\' 10"  (1.778 m)  Wt 223 lb 6.4 oz (101.334 kg)  BMI 32.05 kg/m2  Constitutional:  Alert and oriented, No acute distress. HEENT: [place] AT, moist mucus membranes.  Trachea midline, no masses. Cardiovascular: No clubbing, cyanosis, or edema. Respiratory: Normal respiratory effort, no increased work of breathing. GI: Abdomen is soft, nontender, nondistended, no abdominal masses GU: No CVA tenderness.  Skin: No rashes, bruises or suspicious lesions. Lymph: No cervical or inguinal adenopathy. Neurologic: Grossly intact, no focal deficits, moving all 4 extremities. Psychiatric: Normal mood and affect.  Laboratory Data: Lab Results  Component Value Date   WBC 3.2* 12/17/2014   HGB 13.8 12/17/2014   HCT 42.6 12/17/2014   MCV 81.9 12/17/2014   PLT 171 12/17/2014    Lab Results  Component Value Date   CREATININE 1.13 02/18/2015    Lab Results  Component Value Date   PSA  5.87* 02/23/2015   PSA 5.07* 02/18/2015   PSA 3.87 02/16/2014    No results found for: TESTOSTERONE  Lab Results  Component Value Date   HGBA1C 6.1 02/18/2015    Urinalysis    Component Value Date/Time   COLORURINE YELLOW 12/17/2014 1220   APPEARANCEUR CLEAR 12/17/2014 1220   LABSPEC 1.008 12/17/2014 1220   PHURINE 7.5 12/17/2014 1220   GLUCOSEU NEGATIVE 12/17/2014 1220   HGBUR NEGATIVE 12/17/2014 1220   BILIRUBINUR Negative 02/23/2015 1053   BILIRUBINUR NEGATIVE 12/17/2014 1220   KETONESUR NEGATIVE 12/17/2014 1220   PROTEINUR Trace 02/23/2015 1053   PROTEINUR NEGATIVE 12/17/2014 1220   UROBILINOGEN 0.2 02/23/2015 1053   NITRITE Negative 02/23/2015 1053   NITRITE NEGATIVE 12/17/2014 1220   LEUKOCYTESUR Negative 02/23/2015 1053    Assessment & Plan:    I had a 25 minute conversation the patient regarding his low risk prostate cancer. We discussed the 5 major treatment methods for prostate cancer which include watchful waiting, active surveillance, prostatectomy, radiation therapy and cryotherapy. We focused extensively in active surveillance as well as a prostatectomy. He understands is in somewhat of a gray zone given his 5 out of 12 positive cores. He understands that watching this disease for too long may lead to progression of disease inability to cure. He also understands that removing prostate is not without risks which include impotence and incontinence. Do not think she be a candidate for a true nerve sparing on the right side though possibly on the left. He would like to think about his options and contact the office if he wants to pursue surgery in the near future. Otherwise, we will have him set up for repeat prostate biopsy in 3 months. He may at that point after doing his own research about for either radiation or prostatectomy or remain at that time on active surveillance.  1. Low risk prostate cancer -f/u repeat prostate biopsy in 3 months  2. Right Bosniak 72F 2.2  cm renal cyst -repeat MRI renal protocol in 6 months  3. ED -continue cialis  4. BPH -minimal symptoms. No treatment currently necessary.  Return in about 3 months (around 08/19/2015) for prostate biopsy.  Nickie Retort, MD  [facility] [address], [place],  [address] [phone]

## 2015-05-20 DIAGNOSIS — M4606 Spinal enthesopathy, lumbar region: Secondary | ICD-10-CM | POA: Diagnosis not present

## 2015-05-20 DIAGNOSIS — M545 Low back pain: Secondary | ICD-10-CM | POA: Diagnosis not present

## 2015-05-26 ENCOUNTER — Other Ambulatory Visit: Payer: Self-pay | Admitting: Urology

## 2015-06-10 DIAGNOSIS — M545 Low back pain: Secondary | ICD-10-CM | POA: Diagnosis not present

## 2015-06-10 DIAGNOSIS — M4606 Spinal enthesopathy, lumbar region: Secondary | ICD-10-CM | POA: Diagnosis not present

## 2015-06-14 DIAGNOSIS — M5136 Other intervertebral disc degeneration, lumbar region: Secondary | ICD-10-CM | POA: Diagnosis not present

## 2015-06-14 DIAGNOSIS — M5126 Other intervertebral disc displacement, lumbar region: Secondary | ICD-10-CM | POA: Diagnosis not present

## 2015-06-22 ENCOUNTER — Other Ambulatory Visit: Payer: Self-pay | Admitting: Family Medicine

## 2015-06-22 ENCOUNTER — Telehealth: Payer: Self-pay

## 2015-06-22 DIAGNOSIS — D72819 Decreased white blood cell count, unspecified: Secondary | ICD-10-CM

## 2015-06-22 DIAGNOSIS — R7303 Prediabetes: Secondary | ICD-10-CM

## 2015-06-22 DIAGNOSIS — E785 Hyperlipidemia, unspecified: Secondary | ICD-10-CM

## 2015-06-22 NOTE — Telephone Encounter (Signed)
Attempted to contact patient to schedule IPPE, AWV, or sAWV depending upon date of coverage with [employer].

## 2015-06-23 ENCOUNTER — Other Ambulatory Visit (INDEPENDENT_AMBULATORY_CARE_PROVIDER_SITE_OTHER): Payer: PPO

## 2015-06-23 DIAGNOSIS — D72819 Decreased white blood cell count, unspecified: Secondary | ICD-10-CM

## 2015-06-23 DIAGNOSIS — E785 Hyperlipidemia, unspecified: Secondary | ICD-10-CM | POA: Diagnosis not present

## 2015-06-23 DIAGNOSIS — R7303 Prediabetes: Secondary | ICD-10-CM | POA: Diagnosis not present

## 2015-06-23 LAB — CBC WITH DIFFERENTIAL/PLATELET
BASOS PCT: 0.7 % (ref 0.0–3.0)
Basophils Absolute: 0 10*3/uL (ref 0.0–0.1)
EOS ABS: 0 10*3/uL (ref 0.0–0.7)
EOS PCT: 0.7 % (ref 0.0–5.0)
HEMATOCRIT: 43.7 % (ref 39.0–52.0)
HEMOGLOBIN: 14.3 g/dL (ref 13.0–17.0)
LYMPHS PCT: 53 % — AB (ref 12.0–46.0)
Lymphs Abs: 2.2 10*3/uL (ref 0.7–4.0)
MCHC: 32.8 g/dL (ref 30.0–36.0)
MCV: 81 fl (ref 78.0–100.0)
MONO ABS: 0.5 10*3/uL (ref 0.1–1.0)
Monocytes Relative: 12.6 % — ABNORMAL HIGH (ref 3.0–12.0)
NEUTROS ABS: 1.4 10*3/uL (ref 1.4–7.7)
Neutrophils Relative %: 33 % — ABNORMAL LOW (ref 43.0–77.0)
PLATELETS: 201 10*3/uL (ref 150.0–400.0)
RBC: 5.39 Mil/uL (ref 4.22–5.81)
RDW: 14.3 % (ref 11.5–15.5)
WBC: 4.2 10*3/uL (ref 4.0–10.5)

## 2015-06-23 LAB — HEMOGLOBIN A1C: Hgb A1c MFr Bld: 6 % (ref 4.6–6.5)

## 2015-06-23 LAB — LIPID PANEL
CHOLESTEROL: 216 mg/dL — AB (ref 0–200)
HDL: 37.4 mg/dL — ABNORMAL LOW (ref >39.00)
LDL CALC: 158 mg/dL — AB (ref 0–99)
NonHDL: 178.66
TRIGLYCERIDES: 103 mg/dL (ref 0.0–149.0)
Total CHOL/HDL Ratio: 6
VLDL: 20.6 mg/dL (ref 0.0–40.0)

## 2015-06-24 LAB — PATHOLOGIST SMEAR REVIEW

## 2015-06-27 DIAGNOSIS — M791 Myalgia: Secondary | ICD-10-CM | POA: Diagnosis not present

## 2015-06-27 DIAGNOSIS — M545 Low back pain: Secondary | ICD-10-CM | POA: Diagnosis not present

## 2015-06-27 DIAGNOSIS — M533 Sacrococcygeal disorders, not elsewhere classified: Secondary | ICD-10-CM | POA: Diagnosis not present

## 2015-06-29 ENCOUNTER — Ambulatory Visit: Payer: Federal, State, Local not specified - PPO | Admitting: Family Medicine

## 2015-07-04 NOTE — Telephone Encounter (Signed)
2nd attempt to contact pt regarding Medicare Wellness Visit. Attempt successful.  Per pt, benefits began with [employer] in April 2017. Scheduled pt for IPPE with PCP in August per his request.

## 2015-07-05 ENCOUNTER — Encounter: Payer: Self-pay | Admitting: Family Medicine

## 2015-07-05 ENCOUNTER — Ambulatory Visit (INDEPENDENT_AMBULATORY_CARE_PROVIDER_SITE_OTHER): Payer: PPO | Admitting: Family Medicine

## 2015-07-05 VITALS — BP 144/78 | HR 85 | Temp 98.3°F | Wt 220.5 lb

## 2015-07-05 DIAGNOSIS — S93401A Sprain of unspecified ligament of right ankle, initial encounter: Secondary | ICD-10-CM

## 2015-07-05 DIAGNOSIS — E785 Hyperlipidemia, unspecified: Secondary | ICD-10-CM | POA: Diagnosis not present

## 2015-07-05 DIAGNOSIS — N281 Cyst of kidney, acquired: Secondary | ICD-10-CM | POA: Diagnosis not present

## 2015-07-05 DIAGNOSIS — C61 Malignant neoplasm of prostate: Secondary | ICD-10-CM

## 2015-07-05 HISTORY — DX: Malignant neoplasm of prostate: C61

## 2015-07-05 MED ORDER — ATORVASTATIN CALCIUM 40 MG PO TABS: 40.0000 mg | ORAL_TABLET | Freq: Every day | ORAL | Status: DC

## 2015-07-05 NOTE — Progress Notes (Signed)
BP 144/78 mmHg  Pulse 85  Temp(Src) 98.3 F (36.8 C) (Oral)  Wt 220 lb 8 oz (100.018 kg)  SpO2 97%   CC: 4 mo f/u visit, R ankle injury  Subjective:    Patient ID: Juan Hunt, male    DOB: Jan 20, 1950, 65 y.o.   MRN: [id number]  HPI: Juan Hunt is a 65 y.o. male presenting on 07/05/2015 for Follow-up and Ankle Injury   Welcome to medicare visit scheduled 08/16/2015.   Seeing urology for low risk prostate cancer and R bosniak 63F 2.2cm renal cyst.   [place] off step ladder this afternoon while working on ceiling fan. Landed on R hip and ankle. Pain to lateral right ankle joint. No prior foot fractures.   HLD - compliant with fish oil. ASCVD risk = 13.4% Planning on rejoining [employer].   Relevant past medical, surgical, family and social history reviewed and updated as indicated. Interim medical history since our last visit reviewed. Allergies and medications reviewed and updated. Current Outpatient Prescriptions on File Prior to Visit  Medication Sig  . aspirin EC 81 MG tablet Take 1 tablet (81 mg total) by mouth daily.  . meloxicam (MOBIC) 15 MG tablet Take 15 mg by mouth daily. Reported on 02/28/2015  . Multiple Vitamin (MULTIVITAMIN) tablet Take 1 tablet by mouth daily.  . Omega-3 Fatty Acids (OMEGA 3 PO) Take 1 capsule by mouth daily.  . tadalafil (CIALIS) 20 MG tablet Take 1 tablet (20 mg total) by mouth daily as needed for erectile dysfunction.  . Wheat Dextrin (BENEFIBER) POWD Take 1 scoop by mouth daily.   No current facility-administered medications on file prior to visit.    Review of Systems Per HPI unless specifically indicated in ROS section     Objective:    BP 144/78 mmHg  Pulse 85  Temp(Src) 98.3 F (36.8 C) (Oral)  Wt 220 lb 8 oz (100.018 kg)  SpO2 97%  Wt Readings from Last 3 Encounters:  07/05/15 220 lb 8 oz (100.018 kg)  05/19/15 223 lb 6.4 oz (101.334 kg)  05/05/15 220 lb 9.6 oz (100.064 kg)    Physical Exam  Constitutional: He appears  well-developed and well-nourished. No distress.  Musculoskeletal: He exhibits edema.  Mild swelling lateral R ankle inferior to malleolus. No pain at posterior malleoli, base of 5th MT, navicular. Mild tenderness to calcaneal squeeze test Point tender at ATFL and CFL on right No pain at deltoid ligament, no pain with compression test.  Skin: Skin is warm and dry. No rash noted.  Psychiatric: He has a normal mood and affect.  Nursing note and vitals reviewed.  Results for orders placed or performed in visit on 06/23/15  Lipid panel  Result Value Ref Range   Cholesterol 216 (H) 0 - 200 mg/dL   Triglycerides 103.0 0.0 - 149.0 mg/dL   HDL 37.40 (L) >39.00 mg/dL   VLDL 20.6 0.0 - 40.0 mg/dL   LDL Cholesterol 158 (H) 0 - 99 mg/dL   Total CHOL/HDL Ratio 6    NonHDL 178.66   Pathologist smear review  Result Value Ref Range   Path Review SEE NOTE   CBC with Differential/Platelet  Result Value Ref Range   WBC 4.2 4.0 - 10.5 K/uL   RBC 5.39 4.22 - 5.81 Mil/uL   Hemoglobin 14.3 13.0 - 17.0 g/dL   HCT 43.7 39.0 - 52.0 %   MCV 81.0 78.0 - 100.0 fl   MCHC 32.8 30.0 - 36.0 g/dL   RDW 14.3  11.5 - 15.5 %   Platelets 201.0 150.0 - 400.0 K/uL   Neutrophils Relative % 33.0 (L) 43.0 - 77.0 %   Lymphocytes Relative 53.0 (H) 12.0 - 46.0 %   Monocytes Relative 12.6 (H) 3.0 - 12.0 %   Eosinophils Relative 0.7 0.0 - 5.0 %   Basophils Relative 0.7 0.0 - 3.0 %   Neutro Abs 1.4 1.4 - 7.7 K/uL   Lymphs Abs 2.2 0.7 - 4.0 K/uL   Monocytes Absolute 0.5 0.1 - 1.0 K/uL   Eosinophils Absolute 0.0 0.0 - 0.7 K/uL   Basophils Absolute 0.0 0.0 - 0.1 K/uL  Hemoglobin A1c  Result Value Ref Range   Hgb A1c MFr Bld 6.0 4.6 - 6.5 %      Assessment & Plan:   Problem List Items Addressed This Visit    Hyperlipidemia    Chronic. No improvement over last 4 months. Discussed ASCVD risk of 13% - recommended start statin. Pt agrees. Start lipitor 40mg  daily. Monitor for myalgias.       Relevant Medications    atorvastatin (LIPITOR) 40 MG tablet   Renal cyst, acquired, right    Appreciate uro care of patient.       Sprain of right ankle - Primary    Anticipate grade 2 strain of R lateral ankle.  Conservative measures discussed - ice, elevation, rest, compression, stretching and ROM exercises. May use meloxicam as well. Will take 4-6 wks to fully heal.  Update if not improving as expected. Pt agrees with plan. Placed in ASO brace today.       Prostate cancer ([place])    Appreciate uro care - pending rpt biopsy.           Follow up plan: Return if symptoms worsen or fail to improve.  Ria Bush, MD

## 2015-07-05 NOTE — Assessment & Plan Note (Signed)
Appreciate uro care of patient.  

## 2015-07-05 NOTE — Assessment & Plan Note (Signed)
Anticipate grade 2 strain of R lateral ankle.  Conservative measures discussed - ice, elevation, rest, compression, stretching and ROM exercises. May use meloxicam as well. Will take 4-6 wks to fully heal.  Update if not improving as expected. Pt agrees with plan. Placed in ASO brace today.

## 2015-07-05 NOTE — Assessment & Plan Note (Signed)
Chronic. No improvement over last 4 months. Discussed ASCVD risk of 13% - recommended start statin. Pt agrees. Start lipitor 40mg  daily. Monitor for myalgias.

## 2015-07-05 NOTE — Progress Notes (Signed)
Pre visit review using our clinic review tool, if applicable. No additional management support is needed unless otherwise documented below in the visit note. 

## 2015-07-05 NOTE — Patient Instructions (Addendum)
Start lipitor 40mg  daily. You do have lateral right ankle sprain - treat with ice, elevation, rest, ASO brace when active, and may use mobic. Let us know if not improving, may take 4-6 weeks to fully improve. Do exercises provided today once feeling better. In 2 days start range of motion exercises (alphabet with big toe). Keep follow up appointment in August.

## 2015-07-05 NOTE — Assessment & Plan Note (Signed)
Appreciate uro care- pending rpt biopsy 

## 2015-07-09 HISTORY — PX: OTHER SURGICAL HISTORY: SHX169

## 2015-07-22 DIAGNOSIS — M545 Low back pain: Secondary | ICD-10-CM | POA: Diagnosis not present

## 2015-07-22 DIAGNOSIS — M4606 Spinal enthesopathy, lumbar region: Secondary | ICD-10-CM | POA: Diagnosis not present

## 2015-07-25 DIAGNOSIS — M791 Myalgia: Secondary | ICD-10-CM | POA: Diagnosis not present

## 2015-07-25 DIAGNOSIS — M545 Low back pain: Secondary | ICD-10-CM | POA: Diagnosis not present

## 2015-07-25 DIAGNOSIS — M533 Sacrococcygeal disorders, not elsewhere classified: Secondary | ICD-10-CM | POA: Diagnosis not present

## 2015-08-16 ENCOUNTER — Ambulatory Visit (INDEPENDENT_AMBULATORY_CARE_PROVIDER_SITE_OTHER): Payer: PPO | Admitting: Family Medicine

## 2015-08-16 ENCOUNTER — Encounter: Payer: Self-pay | Admitting: Family Medicine

## 2015-08-16 VITALS — BP 120/64 | HR 60 | Temp 97.9°F | Ht 69.0 in | Wt 220.0 lb

## 2015-08-16 DIAGNOSIS — E785 Hyperlipidemia, unspecified: Secondary | ICD-10-CM

## 2015-08-16 DIAGNOSIS — Z7189 Other specified counseling: Secondary | ICD-10-CM | POA: Diagnosis not present

## 2015-08-16 DIAGNOSIS — N281 Cyst of kidney, acquired: Secondary | ICD-10-CM

## 2015-08-16 DIAGNOSIS — Z0000 Encounter for general adult medical examination without abnormal findings: Secondary | ICD-10-CM | POA: Insufficient documentation

## 2015-08-16 DIAGNOSIS — E669 Obesity, unspecified: Secondary | ICD-10-CM

## 2015-08-16 DIAGNOSIS — C61 Malignant neoplasm of prostate: Secondary | ICD-10-CM

## 2015-08-16 DIAGNOSIS — R7303 Prediabetes: Secondary | ICD-10-CM | POA: Diagnosis not present

## 2015-08-16 NOTE — Assessment & Plan Note (Signed)
Low risk cancer. Planned f/u biopsy later this month Juan Hunt). Appreciate uro care of patient.

## 2015-08-16 NOTE — Patient Instructions (Addendum)
EKG today. Good to see you today. Continue current medicines Continue mediterranean diet and work on better cholesterol control.  Return as needed or for next physical or medicare wellness visit.   Health Maintenance, Male A healthy lifestyle and preventative care can promote health and wellness.  Maintain regular health, dental, and eye exams.  Eat a healthy diet. Foods like vegetables, fruits, whole grains, low-fat dairy products, and lean protein foods contain the nutrients you need and are low in calories. Decrease your intake of foods high in solid fats, added sugars, and salt. Get information about a proper diet from your health care provider, if necessary.  Regular physical exercise is one of the most important things you can do for your health. Most adults should get at least 150 minutes of moderate-intensity exercise (any activity that increases your heart rate and causes you to sweat) each week. In addition, most adults need muscle-strengthening exercises on 2 or more days a week.   Maintain a healthy weight. The body mass index (BMI) is a screening tool to identify possible weight problems. It provides an estimate of body fat based on height and weight. Your health care provider can find your BMI and can help you achieve or maintain a healthy weight. For males 20 years and older:  A BMI below 18.5 is considered underweight.  A BMI of 18.5 to 24.9 is normal.  A BMI of 25 to 29.9 is considered overweight.  A BMI of 30 and above is considered obese.  Maintain normal blood lipids and cholesterol by exercising and minimizing your intake of saturated fat. Eat a balanced diet with plenty of fruits and vegetables. Blood tests for lipids and cholesterol should begin at age 29 and be repeated every 5 years. If your lipid or cholesterol levels are high, you are over age 23, or you are at high risk for heart disease, you may need your cholesterol levels checked more frequently.Ongoing high  lipid and cholesterol levels should be treated with medicines if diet and exercise are not working.  If you smoke, find out from your health care provider how to quit. If you do not use tobacco, do not start.  Lung cancer screening is recommended for adults aged 61-80 years who are at high risk for developing lung cancer because of a history of smoking. A yearly low-dose CT scan of the lungs is recommended for people who have at least a 30-pack-year history of smoking and are current smokers or have quit within the past 15 years. A pack year of smoking is smoking an average of 1 pack of cigarettes a day for 1 year (for example, a 30-pack-year history of smoking could mean smoking 1 pack a day for 30 years or 2 packs a day for 15 years). Yearly screening should continue until the smoker has stopped smoking for at least 15 years. Yearly screening should be stopped for people who develop a health problem that would prevent them from having lung cancer treatment.  If you choose to drink alcohol, do not have more than 2 drinks per day. One drink is considered to be 12 oz (360 mL) of beer, 5 oz (150 mL) of wine, or 1.5 oz (45 mL) of liquor.  Avoid the use of street drugs. Do not share needles with anyone. Ask for help if you need support or instructions about stopping the use of drugs.  High blood pressure causes heart disease and increases the risk of stroke. High blood pressure is more likely  to develop in:  People who have blood pressure in the end of the normal range (100-139/85-89 mm Hg).  People who are overweight or obese.  People who are African American.  If you are 8-9 years of age, have your blood pressure checked every 3-5 years. If you are 45 years of age or older, have your blood pressure checked every year. You should have your blood pressure measured twice--once when you are at a hospital or clinic, and once when you are not at a hospital or clinic. Record the average of the two  measurements. To check your blood pressure when you are not at a hospital or clinic, you can use:  An automated blood pressure machine at a pharmacy.  A home blood pressure monitor.  If you are 79-68 years old, ask your health care provider if you should take aspirin to prevent heart disease.  Diabetes screening involves taking a blood sample to check your fasting blood sugar level. This should be done once every 3 years after age 35 if you are at a normal weight and without risk factors for diabetes. Testing should be considered at a younger age or be carried out more frequently if you are overweight and have at least 1 risk factor for diabetes.  Colorectal cancer can be detected and often prevented. Most routine colorectal cancer screening begins at the age of 50 and continues through age 76. However, your health care provider may recommend screening at an earlier age if you have risk factors for colon cancer. On a yearly basis, your health care provider may provide home test kits to check for hidden blood in the stool. A small camera at the end of a tube may be used to directly examine the colon (sigmoidoscopy or colonoscopy) to detect the earliest forms of colorectal cancer. Talk to your health care provider about this at age 14 when routine screening begins. A direct exam of the colon should be repeated every 5-10 years through age 22, unless early forms of precancerous polyps or small growths are found.  People who are at an increased risk for hepatitis B should be screened for this virus. You are considered at high risk for hepatitis B if:  You were born in a country where hepatitis B occurs often. Talk with your health care provider about which countries are considered high risk.  Your parents were born in a high-risk country and you have not received a shot to protect against hepatitis B (hepatitis B vaccine).  You have HIV or AIDS.  You use needles to inject street drugs.  You live  with, or have sex with, someone who has hepatitis B.  You are a man who has sex with other men (MSM).  You get hemodialysis treatment.  You take certain medicines for conditions like cancer, organ transplantation, and autoimmune conditions.  Hepatitis C blood testing is recommended for all people born from [birth year] through 1965 and any individual with known risk factors for hepatitis C.  Healthy men should no longer receive prostate-specific antigen (PSA) blood tests as part of routine cancer screening. Talk to your health care provider about prostate cancer screening.  Testicular cancer screening is not recommended for adolescents or adult males who have no symptoms. Screening includes self-exam, a health care provider exam, and other screening tests. Consult with your health care provider about any symptoms you have or any concerns you have about testicular cancer.  Practice safe sex. Use condoms and avoid high-risk sexual practices to  reduce the spread of sexually transmitted infections (STIs).  You should be screened for STIs, including gonorrhea and chlamydia if:  You are sexually active and are younger than 24 years.  You are older than 24 years, and your health care provider tells you that you are at risk for this type of infection.  Your sexual activity has changed since you were last screened, and you are at an increased risk for chlamydia or gonorrhea. Ask your health care provider if you are at risk.  If you are at risk of being infected with HIV, it is recommended that you take a prescription medicine daily to prevent HIV infection. This is called pre-exposure prophylaxis (PrEP). You are considered at risk if:  You are a man who has sex with other men (MSM).  You are a heterosexual man who is sexually active with multiple partners.  You take drugs by injection.  You are sexually active with a partner who has HIV.  Talk with your health care provider about whether you are at  high risk of being infected with HIV. If you choose to begin PrEP, you should first be tested for HIV. You should then be tested every 3 months for as long as you are taking PrEP.  Use sunscreen. Apply sunscreen liberally and repeatedly throughout the day. You should seek shade when your shadow is shorter than you. Protect yourself by wearing long sleeves, pants, a wide-brimmed hat, and sunglasses year round whenever you are outdoors.  Tell your health care provider of new moles or changes in moles, especially if there is a change in shape or color. Also, tell your health care provider if a mole is larger than the size of a pencil eraser.  A one-time screening for abdominal aortic aneurysm (AAA) and surgical repair of large AAAs by ultrasound is recommended for men aged 66-75 years who are current or former smokers.  Stay current with your vaccines (immunizations).   This information is not intended to replace advice given to you by your health care provider. Make sure you discuss any questions you have with your health care provider.   Document Released: 06/23/2007 Document Revised: 01/15/2014 Document Reviewed: 05/22/2010 Elsevier Interactive Patient Education [employer].
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
## 2015-08-16 NOTE — Assessment & Plan Note (Signed)
Advanced directive discussion - has not set up. Packet provided today. Would want daughter Juan Hunt to be HCPOA.

## 2015-08-16 NOTE — Assessment & Plan Note (Signed)
Reviewed labs with patient. °

## 2015-08-16 NOTE — Assessment & Plan Note (Signed)
Has f/u scheduled with urology next week.

## 2015-08-16 NOTE — Assessment & Plan Note (Addendum)
I have personally reviewed the Medicare Annual Wellness questionnaire and have noted 1. The patient's medical and social history 2. Their use of alcohol, tobacco or illicit drugs 3. Their current medications and supplements 4. The patient's functional ability including ADL's, fall risks, home safety risks and hearing or visual impairment. Cognitive function has been assessed and addressed as indicated.  5. Diet and physical activity 6. Evidence for depression or mood disorders The patients weight, height, BMI have been recorded in the chart. I have made referrals, counseling and provided education to the patient based on review of the above and I have provided the pt with a written personalized care plan for preventive services. Provider list updated.. See scanned questionairre as needed for further documentation. Reviewed preventative protocols and updated unless pt declined.   EKG - sinus arrhythmia rate high 50s, normal axis, intervals, no acute ST/T changes.

## 2015-08-16 NOTE — Assessment & Plan Note (Signed)
Discussed healthy diet and lifestyle changes to affect sustainable weight loss  

## 2015-08-16 NOTE — Assessment & Plan Note (Signed)
Chronic, LDL above goal. Continue atorvastatin 40mg  daily + fish oil.  ASCVD 10 yr risk = 9.8%

## 2015-08-19 DIAGNOSIS — M545 Low back pain: Secondary | ICD-10-CM | POA: Diagnosis not present

## 2015-08-19 DIAGNOSIS — M4606 Spinal enthesopathy, lumbar region: Secondary | ICD-10-CM | POA: Diagnosis not present

## 2015-08-25 ENCOUNTER — Other Ambulatory Visit: Payer: PPO

## 2015-08-29 ENCOUNTER — Other Ambulatory Visit: Payer: Self-pay | Admitting: Urology

## 2015-08-29 ENCOUNTER — Ambulatory Visit (INDEPENDENT_AMBULATORY_CARE_PROVIDER_SITE_OTHER): Payer: PPO | Admitting: Urology

## 2015-08-29 ENCOUNTER — Encounter: Payer: Self-pay | Admitting: Urology

## 2015-08-29 VITALS — BP 147/84 | HR 53 | Ht 70.0 in | Wt 222.0 lb

## 2015-08-29 DIAGNOSIS — Q6100 Congenital renal cyst, unspecified: Secondary | ICD-10-CM | POA: Diagnosis not present

## 2015-08-29 DIAGNOSIS — N281 Cyst of kidney, acquired: Secondary | ICD-10-CM

## 2015-08-29 DIAGNOSIS — C61 Malignant neoplasm of prostate: Secondary | ICD-10-CM

## 2015-08-29 NOTE — Progress Notes (Signed)
HPI   Prostate Cancer The patient was diagnosed with a Gleason 3+3 = 6 prostate cancer 4/27/12017. It is 5 out of 12 cores positive on the right side. Findings range from 9% to 50%. He also had suspicious areas of atypia and PI on the left side.  PSA: 5.87  Patient's PSA had risen from 3.87 ng/mL one year ago to 5.07 ng/mL a few days ago. Repeat PSA 5.87. His father was diagnosed at age 65 with prostate cancer and underwent a RRP. His father is still living at age 28 and independent.   BPH WITH LUTS His IPSS score was 9, which is moderate lower urinary tract symptomatology. He is pleased with his quality life due to his urinary symptoms.  Right renal Bosniak 64F 2.2 cm cyst Due for repeat MRI in 6 months -- Aug 2017  Today, [name] is seen for the above. He is here for confirmatory/surveillance prostate biopsy. He has no dysuria or fever. No hematuria.    Prostate biopsy:  - Gentamicin given prophylactically - Levaquin 500 mg administered PO -Transrectal Ultrasound performed revealing a 25 gm prostate -No significant hypoechoic or median lobe noted  Procedure: - Prostate block performed using 10 cc 1% lidocaine and biopsies taken from sextant areas, a total of 12 under ultrasound guidance (left and right; base, mid, apex; lateral to medial)  Post-Procedure: - Patient tolerated the procedure well    A/P -  1) Prostate cancer - f/u to review path.  2) Renal cyst - discussed surveillance MRI (kidneys) and he elects to proceed.

## 2015-09-02 ENCOUNTER — Other Ambulatory Visit: Payer: Self-pay | Admitting: Urology

## 2015-09-02 LAB — PATHOLOGY REPORT

## 2015-09-05 ENCOUNTER — Ambulatory Visit (INDEPENDENT_AMBULATORY_CARE_PROVIDER_SITE_OTHER): Payer: PPO | Admitting: Urology

## 2015-09-05 ENCOUNTER — Encounter: Payer: Self-pay | Admitting: Urology

## 2015-09-05 VITALS — BP 134/80 | HR 57 | Ht 70.0 in | Wt 222.6 lb

## 2015-09-05 DIAGNOSIS — C61 Malignant neoplasm of prostate: Secondary | ICD-10-CM
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
## 2015-09-06 ENCOUNTER — Encounter: Payer: Self-pay | Admitting: Family Medicine

## 2015-09-09 ENCOUNTER — Ambulatory Visit
Admission: RE | Admit: 2015-09-09 | Discharge: 2015-09-09 | Disposition: A | Discharge status: Home / Self Care | Payer: PPO | Source: Ambulatory Visit | Attending: Urology | Admitting: Urology

## 2015-09-09 DIAGNOSIS — I700 Atherosclerosis of aorta: Secondary | ICD-10-CM | POA: Diagnosis not present

## 2015-09-09 DIAGNOSIS — N281 Cyst of kidney, acquired: Secondary | ICD-10-CM | POA: Diagnosis not present

## 2015-09-09 DIAGNOSIS — Q6100 Congenital renal cyst, unspecified: Secondary | ICD-10-CM | POA: Insufficient documentation

## 2015-09-09 DIAGNOSIS — K760 Fatty (change of) liver, not elsewhere classified: Secondary | ICD-10-CM | POA: Diagnosis not present

## 2015-09-09 LAB — POCT I-STAT CREATININE: CREATININE: 1 mg/dL (ref 0.61–1.24)

## 2015-09-09 MED ORDER — GADOBENATE DIMEGLUMINE 529 MG/ML IV SOLN
20.0000 mL | Freq: Once | INTRAVENOUS | Status: AC | PRN
  Administered 2015-09-09: 20 mL via INTRAVENOUS

## 2015-11-02 ENCOUNTER — Telehealth: Payer: Self-pay

## 2015-11-02 DIAGNOSIS — N281 Cyst of kidney, acquired: Secondary | ICD-10-CM

## 2015-11-02 NOTE — Telephone Encounter (Signed)
Festus Aloe, MD  Lestine Box, LPN        Notify patient the cysts in his kidneys are stable and we can continue to monitor. Please make a chart note pt needs an MRI of kidneys Mar 2017 or about the time he returns in f/u of prostate cancer.    Spoke with pt in reference to cysts on kidneys and needing MRI in march 2018. Pt voiced understanding. MRI orders placed.

## 2015-12-20 ENCOUNTER — Telehealth: Payer: Self-pay | Admitting: Family Medicine

## 2015-12-20 NOTE — Telephone Encounter (Signed)
ok to schedule. thx

## 2015-12-20 NOTE — Telephone Encounter (Signed)
Pt wanted to get pneumonia and flu shot  Is it ok to schedule?

## 2016-01-10 ENCOUNTER — Telehealth: Payer: Self-pay | Admitting: Family Medicine

## 2016-01-10 NOTE — Telephone Encounter (Signed)
If improving by Thursday ok to keep appt. If fever or worsening sxs into tomorrow, likely best to postpone by a week.

## 2016-01-10 NOTE — Telephone Encounter (Signed)
Pt called because he is scheduled for a pneumonia shot and a flu shot on the Thursday.  He has gotten a cold and wants to know if we can still administer the shots or if he needs to reschedule.  Can you please call him to advise.  Thanks!

## 2016-01-11 ENCOUNTER — Other Ambulatory Visit: Payer: Self-pay

## 2016-01-11 DIAGNOSIS — Z8546 Personal history of malignant neoplasm of prostate: Secondary | ICD-10-CM

## 2016-01-11 DIAGNOSIS — C61 Malignant neoplasm of prostate: Secondary | ICD-10-CM

## 2016-01-11 NOTE — Telephone Encounter (Signed)
Patient notified and he opted to reschedule due to not feeling much better. Nurse visit rescheduled for next week. He didn't feel that he needed to be evaluated by a provider.

## 2016-01-12 ENCOUNTER — Ambulatory Visit: Payer: PPO

## 2016-01-19 ENCOUNTER — Ambulatory Visit: Payer: PPO

## 2016-01-23 ENCOUNTER — Ambulatory Visit (INDEPENDENT_AMBULATORY_CARE_PROVIDER_SITE_OTHER): Payer: PPO | Admitting: Family Medicine

## 2016-01-23 ENCOUNTER — Encounter: Payer: Self-pay | Admitting: Family Medicine

## 2016-01-23 VITALS — BP 146/72 | HR 72 | Temp 98.3°F | Wt 222.0 lb

## 2016-01-23 DIAGNOSIS — H6121 Impacted cerumen, right ear: Secondary | ICD-10-CM

## 2016-01-23 DIAGNOSIS — H6502 Acute serous otitis media, left ear: Secondary | ICD-10-CM

## 2016-01-23 DIAGNOSIS — H6120 Impacted cerumen, unspecified ear: Secondary | ICD-10-CM | POA: Insufficient documentation

## 2016-01-23 DIAGNOSIS — Z23 Encounter for immunization: Secondary | ICD-10-CM

## 2016-01-23 DIAGNOSIS — H6592 Unspecified nonsuppurative otitis media, left ear: Secondary | ICD-10-CM | POA: Insufficient documentation

## 2016-01-23 MED ORDER — AMOXICILLIN 500 MG PO CAPS: 500.0000 mg | ORAL_CAPSULE | Freq: Three times a day (TID) | ORAL | 0 refills | Status: DC

## 2016-01-23 NOTE — Progress Notes (Signed)
BP (!) 146/72   Pulse 72   Temp 98.3 F (36.8 C) (Oral)   Wt 222 lb (100.7 kg)   BMI 31.85 kg/m    CC: check ears - hearing problem Subjective:    Patient ID: Juan Hunt, male    DOB: Apr 25, 1950, 66 y.o.   MRN: [id number]  HPI: Juan Hunt is a 66 y.o. male presenting on 01/23/2016 for Hearing Problem (left ear; muffled hearing x1 week) and Hip Pain (left; x2 weeks; walking worsens pain)   1 wk h/o L ear fullness/pressure and muffled hearing. Initially painful but not anymore. + L tinnitus, mild nausea and night sweats no improving.  He did have recent URI 2 wks ago - finally slowly getting better.  No recent swimming.  + sick contacts at home - wife with cold.   Requests flu shot if able. Former smoker. No h/o asthma.   Relevant past medical, surgical, family and social history reviewed and updated as indicated. Interim medical history since our last visit reviewed. Allergies and medications reviewed and updated. Current Outpatient Prescriptions on File Prior to Visit  Medication Sig  . aspirin EC 81 MG tablet Take 1 tablet (81 mg total) by mouth daily.  Marland Kitchen atorvastatin (LIPITOR) 40 MG tablet Take 1 tablet (40 mg total) by mouth daily.  . meloxicam (MOBIC) 15 MG tablet Take 15 mg by mouth daily. Reported on 02/28/2015  . Multiple Vitamin (MULTIVITAMIN) tablet Take 1 tablet by mouth daily.  . Omega-3 Fatty Acids (OMEGA 3 PO) Take 1 capsule by mouth daily.  . tadalafil (CIALIS) 20 MG tablet Take 1 tablet (20 mg total) by mouth daily as needed for erectile dysfunction.  . Wheat Dextrin (BENEFIBER) POWD Take 1 scoop by mouth daily.   No current facility-administered medications on file prior to visit.     Review of Systems Per HPI unless specifically indicated in ROS section     Objective:    BP (!) 146/72   Pulse 72   Temp 98.3 F (36.8 C) (Oral)   Wt 222 lb (100.7 kg)   BMI 31.85 kg/m   Wt Readings from Last 3 Encounters:  01/23/16 222 lb (100.7 kg)    09/05/15 222 lb 9.6 oz (101 kg)  08/29/15 222 lb (100.7 kg)    Physical Exam  Constitutional: He appears well-developed and well-nourished. No distress.  HENT:  Head: Normocephalic and atraumatic.  Right Ear: Hearing, external ear and ear canal normal.  Left Ear: Hearing, external ear and ear canal normal.  Nose: Nose normal. No mucosal edema or rhinorrhea. Right sinus exhibits no maxillary sinus tenderness and no frontal sinus tenderness. Left sinus exhibits no maxillary sinus tenderness and no frontal sinus tenderness.  Mouth/Throat: Uvula is midline, oropharynx is clear and moist and mucous membranes are normal. No oropharyngeal exudate, posterior oropharyngeal edema, posterior oropharyngeal erythema or tonsillar abscesses.  R TM covered by cerumen - manual disimpaction attempted without success L TM retracted, erythematous, fluid behind  Eyes: Conjunctivae and EOM are normal. Pupils are equal, round, and reactive to light. No scleral icterus.  Neck: Normal range of motion. Neck supple.  Cardiovascular: Normal rate, regular rhythm, normal heart sounds and intact distal pulses.   No murmur heard. Pulmonary/Chest: Effort normal and breath sounds normal. No respiratory distress. He has no wheezes. He has no rales.  Musculoskeletal: He exhibits no edema.  Lymphadenopathy:    He has no cervical adenopathy.  Skin: Skin is warm and dry. No rash noted.  Nursing  note and vitals reviewed.  Results for orders placed or performed during the hospital encounter of 09/09/15  I-STAT creatinine  Result Value Ref Range   Creatinine, Ser 1.00 0.61 - 1.24 mg/dL      Assessment & Plan:   Problem List Items Addressed This Visit    Cerumen impaction    Manual disimpaction not successful - rec home treatment with mineral oil, recheck at CPE next month.  Pt agrees with plan.      Left serous otitis media - Primary    After initial URI. Treat with amox course, supportive care. Reassess at CPE next  month.           Follow up plan: No Follow-up on file.  Ria Bush, MD

## 2016-01-23 NOTE — Progress Notes (Signed)
Pre visit review using our clinic review tool, if applicable. No additional management support is needed unless otherwise documented below in the visit note. 

## 2016-01-23 NOTE — Addendum Note (Signed)
Addended by: Royann Shivers A on: 01/23/2016 04:28 PM   Modules accepted: Orders

## 2016-01-23 NOTE — Patient Instructions (Signed)
Flu shot today You have left serous otitis - treat with amoxicillin course.  For right ear - use mineral oil drops to soften wax. Push fluids and rest.  We can recheck ear at physical

## 2016-01-23 NOTE — Assessment & Plan Note (Signed)
Manual disimpaction not successful - rec home treatment with mineral oil, recheck at CPE next month.  Pt agrees with plan.

## 2016-01-23 NOTE — Addendum Note (Signed)
Addended by: Ria Bush on: 01/23/2016 04:25 PM   Modules accepted: Orders

## 2016-01-23 NOTE — Assessment & Plan Note (Addendum)
After initial URI. Treat with amox course, supportive care. Reassess at CPE next month.

## 2016-03-02 ENCOUNTER — Encounter (HOSPITAL_COMMUNITY): Payer: Self-pay

## 2016-03-02 ENCOUNTER — Ambulatory Visit (HOSPITAL_COMMUNITY)
Admission: RE | Admit: 2016-03-02 | Discharge: 2016-03-02 | Disposition: A | Discharge status: Home / Self Care | Payer: PPO | Source: Ambulatory Visit | Attending: Urology | Admitting: Urology

## 2016-03-02 ENCOUNTER — Other Ambulatory Visit: Payer: Self-pay

## 2016-03-02 DIAGNOSIS — C61 Malignant neoplasm of prostate: Secondary | ICD-10-CM

## 2016-03-05 ENCOUNTER — Other Ambulatory Visit: Payer: PPO

## 2016-03-05 DIAGNOSIS — C61 Malignant neoplasm of prostate: Secondary | ICD-10-CM | POA: Diagnosis not present

## 2016-03-06 ENCOUNTER — Ambulatory Visit: Payer: PPO

## 2016-03-06 LAB — PSA: Prostate Specific Ag, Serum: 5 ng/mL — ABNORMAL HIGH (ref 0.0–4.0)

## 2016-03-07 ENCOUNTER — Telehealth: Payer: Self-pay

## 2016-03-07 NOTE — Telephone Encounter (Signed)
Spoke with pt in reference to PSA results. Pt is supposed to have an OV with Dr. Louis Meckel in Feb/March. Pt requested appt be made and information mailed to him.  Can you make this?

## 2016-03-07 NOTE — Telephone Encounter (Signed)
-----   Message from [name], MD sent at 03/06/2016  5:59 PM EST ----- Please let the patient know that his PSA is stable from one year ago.  This is good news.

## 2016-03-08 NOTE — Telephone Encounter (Signed)
done

## 2016-04-03 ENCOUNTER — Encounter: Payer: Self-pay | Admitting: Urology

## 2016-04-03 ENCOUNTER — Ambulatory Visit: Payer: PPO | Admitting: Urology

## 2016-04-03 VITALS — BP 128/84 | HR 62 | Ht 70.0 in | Wt 222.2 lb

## 2016-04-03 DIAGNOSIS — C61 Malignant neoplasm of prostate: Secondary | ICD-10-CM | POA: Diagnosis not present

## 2016-04-03 MED ORDER — DIAZEPAM 10 MG PO TABS: 10.0000 mg | ORAL_TABLET | Freq: Once | ORAL | 0 refills | Status: AC
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
## 2016-04-04 ENCOUNTER — Ambulatory Visit (INDEPENDENT_AMBULATORY_CARE_PROVIDER_SITE_OTHER): Payer: PPO | Admitting: Family Medicine

## 2016-04-04 ENCOUNTER — Encounter: Payer: Self-pay | Admitting: Family Medicine

## 2016-04-04 VITALS — BP 136/82 | HR 64 | Temp 98.8°F | Wt 222.5 lb

## 2016-04-04 DIAGNOSIS — K625 Hemorrhage of anus and rectum: Secondary | ICD-10-CM | POA: Insufficient documentation

## 2016-04-04 MED ORDER — ATORVASTATIN CALCIUM 40 MG PO TABS: 40.0000 mg | ORAL_TABLET | Freq: Every day | ORAL | 11 refills | Status: DC

## 2016-04-04 NOTE — Assessment & Plan Note (Signed)
2 isolated episodes with rapid resolution. Anticipate internal hemorrhoid related bleed. Discussed this with patient. Reviewed diet and lifestyle changes to prevent recurrence per pt instructions. No signs of anal fissure today, no inflamed external hemorrhoid today. Update if persistent. Pt agrees with plan.

## 2016-04-04 NOTE — Progress Notes (Signed)
BP 136/82   Pulse 64   Temp 98.8 F (37.1 C) (Oral)   Wt 222 lb 8 oz (100.9 kg)   BMI 31.93 kg/m    CC: blood in stools Subjective:    Patient ID: Juan Hunt, male    DOB: July 06, 1950, 66 y.o.   MRN: [id number]  HPI: Juan Hunt is a 66 y.o. male presenting on 04/04/2016 for Blood In Stools (on occasion; BRB; some rectal discomfort)   Planning trip to [place] next week for his birthday.   Blood noted in stools twice in the last 2 months. Blood noted in commode and with wiping, pain as well. No itching. This would last 2-3 days. Last noted 3/62018. He felt he may have been a bit constipated with straining. He started using [employer] stool softener which did help. Continues feeling mild rectal discomfort with prolonged drives. He   Low risk prostate cancer followed by urology. R bosniak 48F renal cyst followed by urology. Pending MRI.   He does regularly take 1 teaspoon of benefiber almost daily.   COLONOSCOPY 04/2013 2 TA, mod diverticulosis, rpt 5 yrs (Pyrtle). Rectal vault visualized.   Relevant past medical, surgical, family and social history reviewed and updated as indicated. Interim medical history since our last visit reviewed. Allergies and medications reviewed and updated. Outpatient Medications Prior to Visit  Medication Sig Dispense Refill  . aspirin EC 81 MG tablet Take 1 tablet (81 mg total) by mouth daily.    . Multiple Vitamin (MULTIVITAMIN) tablet Take 1 tablet by mouth daily.    . Omega-3 Fatty Acids (OMEGA 3 PO) Take 1 capsule by mouth daily.    . tadalafil (CIALIS) 20 MG tablet Take 1 tablet (20 mg total) by mouth daily as needed for erectile dysfunction. 10 tablet 3  . Wheat Dextrin (BENEFIBER) POWD Take 1 scoop by mouth daily.    Marland Kitchen atorvastatin (LIPITOR) 40 MG tablet Take 1 tablet (40 mg total) by mouth daily. 30 tablet 3  . amoxicillin (AMOXIL) 500 MG capsule Take 1 capsule (500 mg total) by mouth 3 (three) times daily. 21 capsule 0  . meloxicam (MOBIC) 15  MG tablet Take 15 mg by mouth daily. Reported on 02/28/2015     No facility-administered medications prior to visit.      Per HPI unless specifically indicated in ROS section below Review of Systems     Objective:    BP 136/82   Pulse 64   Temp 98.8 F (37.1 C) (Oral)   Wt 222 lb 8 oz (100.9 kg)   BMI 31.93 kg/m   Wt Readings from Last 3 Encounters:  04/04/16 222 lb 8 oz (100.9 kg)  04/03/16 222 lb 3.2 oz (100.8 kg)  01/23/16 222 lb (100.7 kg)    Physical Exam  Constitutional: He appears well-developed and well-nourished. No distress.  Genitourinary: Rectum normal. Rectal exam shows no external hemorrhoid, no internal hemorrhoid, no fissure, no mass, no tenderness, anal tone normal and guaiac negative stool.  Genitourinary Comments: DRE without abnormality or tenderness today.  Nursing note and vitals reviewed.  Results for orders placed or performed in visit on 03/05/16  PSA  Result Value Ref Range   Prostate Specific Ag, Serum 5.0 (H) 0.0 - 4.0 ng/mL      Assessment & Plan:   Problem List Items Addressed This Visit    BRBPR (bright red blood per rectum) - Primary    2 isolated episodes with rapid resolution. Anticipate internal hemorrhoid related bleed. Discussed  this with patient. Reviewed diet and lifestyle changes to prevent recurrence per pt instructions. No signs of anal fissure today, no inflamed external hemorrhoid today. Update if persistent. Pt agrees with plan.           Follow up plan: Return if symptoms worsen or fail to improve.  Ria Bush, MD

## 2016-04-04 NOTE — Patient Instructions (Addendum)
I think you did have internal hemorrhoid irritation leading to the bleeding episodes.  Goal is 1 soft stool a day. Continue benefiber, continue fiber in diet and good amounts of water daily.  Avoid sitting on commode for prolonged time.  Let us know if recurrent or persistent symptoms.   Hemorrhoids Hemorrhoids are swollen veins in and around the rectum or anus. There are two types of hemorrhoids:  Internal hemorrhoids. These occur in the veins that are just inside the rectum. They may poke through to the outside and become irritated and painful.  External hemorrhoids. These occur in the veins that are outside of the anus and can be felt as a painful swelling or hard lump near the anus. Most hemorrhoids do not cause serious problems, and they can be managed with home treatments such as diet and lifestyle changes. If home treatments do not help your symptoms, procedures can be done to shrink or remove the hemorrhoids. What are the causes? This condition is caused by increased pressure in the anal area. This pressure may result from various things, including:  Constipation.  Straining to have a bowel movement.  Diarrhea.  Pregnancy.  Obesity.  Sitting for long periods of time.  Heavy lifting or other activity that causes you to strain.  Anal sex. What are the signs or symptoms? Symptoms of this condition include:  Pain.  Anal itching or irritation.  Rectal bleeding.  Leakage of stool (feces).  Anal swelling.  One or more lumps around the anus. How is this diagnosed? This condition can often be diagnosed through a visual exam. Other exams or tests may also be done, such as:  Examination of the rectal area with a gloved hand (digital rectal exam).  Examination of the anal canal using a small tube (anoscope).  A blood test, if you have lost a significant amount of blood.  A test to look inside the colon (sigmoidoscopy or colonoscopy). How is this treated? This  condition can usually be treated at home. However, various procedures may be done if dietary changes, lifestyle changes, and other home treatments do not help your symptoms. These procedures can help make the hemorrhoids smaller or remove them completely. Some of these procedures involve surgery, and others do not. Common procedures include:  Rubber band ligation. Rubber bands are placed at the base of the hemorrhoids to cut off the blood supply to them.  Sclerotherapy. Medicine is injected into the hemorrhoids to shrink them.  Infrared coagulation. A type of light energy is used to get rid of the hemorrhoids.  Hemorrhoidectomy surgery. The hemorrhoids are surgically removed, and the veins that supply them are tied off.  Stapled hemorrhoidopexy surgery. A circular stapling device is used to remove the hemorrhoids and use staples to cut off the blood supply to them. Follow these instructions at home: Eating and drinking   Eat foods that have a lot of fiber in them, such as whole grains, beans, nuts, fruits, and vegetables. Ask your health care provider about taking products that have added fiber (fiber supplements).  Drink enough fluid to keep your urine clear or pale yellow. Managing pain and swelling   Take warm sitz baths for 20 minutes, 3-4 times a day to ease pain and discomfort.  If directed, apply ice to the affected area. Using ice packs between sitz baths may be helpful.  Put ice in a plastic bag.  Place a towel between your skin and the bag.  Leave the ice on for 20 minutes,  2-3 times a day. General instructions   Take over-the-counter and prescription medicines only as told by your health care provider.  Use medicated creams or suppositories as told.  Exercise regularly.  Go to the bathroom when you have the urge to have a bowel movement. Do not wait.  Avoid straining to have bowel movements.  Keep the anal area dry and clean. Use wet toilet paper or moist towelettes  after a bowel movement.  Do not sit on the toilet for long periods of time. This increases blood pooling and pain. Contact a health care provider if:  You have increasing pain and swelling that are not controlled by treatment or medicine.  You have uncontrolled bleeding.  You have difficulty having a bowel movement, or you are unable to have a bowel movement.  You have pain or inflammation outside the area of the hemorrhoids. This information is not intended to replace advice given to you by your health care provider. Make sure you discuss any questions you have with your health care provider. Document Released: 12/23/1999 Document Revised: 05/25/2015 Document Reviewed: 09/08/2014 Elsevier Interactive Patient Education  2017 [employer].

## 2016-04-04 NOTE — Progress Notes (Signed)
Pre visit review using our clinic review tool, if applicable. No additional management support is needed unless otherwise documented below in the visit note. 

## 2016-05-10 ENCOUNTER — Ambulatory Visit (HOSPITAL_COMMUNITY)
Admission: RE | Admit: 2016-05-10 | Discharge: 2016-05-10 | Disposition: A | Discharge status: Home / Self Care | Payer: PPO | Source: Ambulatory Visit | Attending: Urology | Admitting: Urology

## 2016-05-10 DIAGNOSIS — K5730 Diverticulosis of large intestine without perforation or abscess without bleeding: Secondary | ICD-10-CM | POA: Insufficient documentation

## 2016-05-10 DIAGNOSIS — C61 Malignant neoplasm of prostate: Secondary | ICD-10-CM

## 2016-05-10 DIAGNOSIS — N4289 Other specified disorders of prostate: Secondary | ICD-10-CM | POA: Diagnosis not present

## 2016-05-10 LAB — POCT I-STAT CREATININE: Creatinine, Ser: 1 mg/dL (ref 0.61–1.24)

## 2016-05-10 MED ORDER — GADOBENATE DIMEGLUMINE 529 MG/ML IV SOLN
20.0000 mL | Freq: Once | INTRAVENOUS | Status: AC | PRN
  Administered 2016-05-10: 20 mL via INTRAVENOUS

## 2016-05-15 ENCOUNTER — Ambulatory Visit: Payer: PPO | Admitting: Urology

## 2016-05-15 ENCOUNTER — Encounter: Payer: Self-pay | Admitting: Urology

## 2016-05-15 VITALS — BP 129/74 | HR 66 | Ht 70.0 in | Wt 202.0 lb

## 2016-05-15 DIAGNOSIS — C61 Malignant neoplasm of prostate: Secondary | ICD-10-CM | POA: Diagnosis not present
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
## 2016-07-20 ENCOUNTER — Ambulatory Visit (INDEPENDENT_AMBULATORY_CARE_PROVIDER_SITE_OTHER): Payer: PPO | Admitting: Family Medicine

## 2016-07-20 ENCOUNTER — Encounter: Payer: Self-pay | Admitting: Family Medicine

## 2016-07-20 VITALS — BP 140/70 | HR 63 | Ht 70.0 in | Wt 220.0 lb

## 2016-07-20 DIAGNOSIS — N529 Male erectile dysfunction, unspecified: Secondary | ICD-10-CM | POA: Diagnosis not present

## 2016-07-20 MED ORDER — SILDENAFIL CITRATE 20 MG PO TABS: 40.0000 mg | ORAL_TABLET | Freq: Every day | ORAL | 3 refills | Status: DC | PRN

## 2016-07-20 NOTE — Patient Instructions (Signed)
Stop cialis. Trial generic viagra (sildenafil) 2-5 tablets as needed.  Schedule medicare wellness visit and physical at your convenience in 1 month or later.

## 2016-07-20 NOTE — Progress Notes (Signed)
Pre visit review using our clinic review tool, if applicable. No additional management support is needed unless otherwise documented below in the visit note. 

## 2016-07-20 NOTE — Progress Notes (Signed)
   BP 140/70   Pulse 63   Ht 5\' 10"  (1.778 m)   Wt 220 lb (99.8 kg)   SpO2 98%   BMI 31.57 kg/m    CC: discuss ED/cialis Subjective:    Patient ID: Juan Hunt, male    DOB: 28-Dec-1950, 66 y.o.   MRN: [id number]  HPI: Juan Hunt is a 66 y.o. male presenting on 07/20/2016 for Medication Management   ED - cialis not as effective as he would like - still trouble maintaining erection. Currently on 20mg  dose, takes approx q2ks.   GERD - has self weaned off nexium as symptoms have improved.   Relevant past medical, surgical, family and social history reviewed and updated as indicated. Interim medical history since our last visit reviewed. Allergies and medications reviewed and updated. Outpatient Medications Prior to Visit  Medication Sig Dispense Refill  . aspirin EC 81 MG tablet Take 1 tablet (81 mg total) by mouth daily.    Marland Kitchen atorvastatin (LIPITOR) 40 MG tablet Take 1 tablet (40 mg total) by mouth daily. 30 tablet 11  . Multiple Vitamin (MULTIVITAMIN) tablet Take 1 tablet by mouth daily.    . Omega-3 Fatty Acids (OMEGA 3 PO) Take 1 capsule by mouth daily.    . Wheat Dextrin (BENEFIBER) POWD Take 1 scoop by mouth daily.    . tadalafil (CIALIS) 20 MG tablet Take 1 tablet (20 mg total) by mouth daily as needed for erectile dysfunction. 10 tablet 3   No facility-administered medications prior to visit.      Per HPI unless specifically indicated in ROS section below Review of Systems     Objective:    BP 140/70   Pulse 63   Ht 5\' 10"  (1.778 m)   Wt 220 lb (99.8 kg)   SpO2 98%   BMI 31.57 kg/m   Wt Readings from Last 3 Encounters:  07/20/16 220 lb (99.8 kg)  05/15/16 202 lb (91.6 kg)  04/04/16 222 lb 8 oz (100.9 kg)    Physical Exam  Constitutional: He appears well-developed and well-nourished. No distress.  Psychiatric: He has a normal mood and affect.  Nursing note and vitals reviewed.  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  Viagra previously caused headache, he is willing to try generic sildenafil. Discussed use of medication.  He will update me at CPE effect. Considering return to urology to discuss other options.           Follow up plan: Return for annual exam, prior fasting for blood work, medicare wellness visit.  Ria Bush, MD

## 2016-07-20 NOTE — Assessment & Plan Note (Signed)
Cilais not as effective as he would like.  Viagra previously caused headache, he is willing to try generic sildenafil. Discussed use of medication.  He will update me at CPE effect. Considering return to urology to discuss other options.

## 2016-08-01 ENCOUNTER — Emergency Department (HOSPITAL_COMMUNITY): Payer: PPO

## 2016-08-01 ENCOUNTER — Encounter (HOSPITAL_COMMUNITY): Payer: Self-pay | Admitting: Vascular Surgery

## 2016-08-01 ENCOUNTER — Inpatient Hospital Stay (HOSPITAL_COMMUNITY)
Admission: EM | Admit: 2016-08-01 | Discharge: 2016-08-06 | DRG: 505 | Disposition: A | Discharge status: Home / Self Care | Payer: PPO | Attending: Orthopedic Surgery | Admitting: Orthopedic Surgery

## 2016-08-01 DIAGNOSIS — M19042 Primary osteoarthritis, left hand: Secondary | ICD-10-CM | POA: Diagnosis present

## 2016-08-01 DIAGNOSIS — Z87891 Personal history of nicotine dependence: Secondary | ICD-10-CM

## 2016-08-01 DIAGNOSIS — Z7982 Long term (current) use of aspirin: Secondary | ICD-10-CM | POA: Diagnosis not present

## 2016-08-01 DIAGNOSIS — E875 Hyperkalemia: Secondary | ICD-10-CM | POA: Diagnosis not present

## 2016-08-01 DIAGNOSIS — S93402A Sprain of unspecified ligament of left ankle, initial encounter: Secondary | ICD-10-CM | POA: Diagnosis not present

## 2016-08-01 DIAGNOSIS — M79662 Pain in left lower leg: Secondary | ICD-10-CM | POA: Diagnosis not present

## 2016-08-01 DIAGNOSIS — S92102A Unspecified fracture of left talus, initial encounter for closed fracture: Secondary | ICD-10-CM | POA: Diagnosis not present

## 2016-08-01 DIAGNOSIS — S6992XA Unspecified injury of left wrist, hand and finger(s), initial encounter: Secondary | ICD-10-CM | POA: Diagnosis not present

## 2016-08-01 DIAGNOSIS — M79661 Pain in right lower leg: Secondary | ICD-10-CM | POA: Diagnosis not present

## 2016-08-01 DIAGNOSIS — S63502A Unspecified sprain of left wrist, initial encounter: Secondary | ICD-10-CM | POA: Diagnosis present

## 2016-08-01 DIAGNOSIS — S92109A Unspecified fracture of unspecified talus, initial encounter for closed fracture: Secondary | ICD-10-CM

## 2016-08-01 DIAGNOSIS — K219 Gastro-esophageal reflux disease without esophagitis: Secondary | ICD-10-CM | POA: Diagnosis not present

## 2016-08-01 DIAGNOSIS — S99912A Unspecified injury of left ankle, initial encounter: Secondary | ICD-10-CM | POA: Diagnosis not present

## 2016-08-01 DIAGNOSIS — S8991XA Unspecified injury of right lower leg, initial encounter: Secondary | ICD-10-CM | POA: Diagnosis not present

## 2016-08-01 DIAGNOSIS — M79672 Pain in left foot: Secondary | ICD-10-CM | POA: Diagnosis not present

## 2016-08-01 DIAGNOSIS — S92152A Displaced avulsion fracture (chip fracture) of left talus, initial encounter for closed fracture: Secondary | ICD-10-CM | POA: Diagnosis not present

## 2016-08-01 DIAGNOSIS — M25572 Pain in left ankle and joints of left foot: Secondary | ICD-10-CM | POA: Diagnosis not present

## 2016-08-01 DIAGNOSIS — S069X0A Unspecified intracranial injury without loss of consciousness, initial encounter: Secondary | ICD-10-CM | POA: Diagnosis not present

## 2016-08-01 DIAGNOSIS — E785 Hyperlipidemia, unspecified: Secondary | ICD-10-CM | POA: Diagnosis present

## 2016-08-01 DIAGNOSIS — S299XXA Unspecified injury of thorax, initial encounter: Secondary | ICD-10-CM | POA: Diagnosis not present

## 2016-08-01 DIAGNOSIS — S59902A Unspecified injury of left elbow, initial encounter: Secondary | ICD-10-CM | POA: Diagnosis not present

## 2016-08-01 DIAGNOSIS — Z8546 Personal history of malignant neoplasm of prostate: Secondary | ICD-10-CM | POA: Diagnosis not present

## 2016-08-01 DIAGNOSIS — S92101A Unspecified fracture of right talus, initial encounter for closed fracture: Secondary | ICD-10-CM

## 2016-08-01 DIAGNOSIS — M25562 Pain in left knee: Secondary | ICD-10-CM | POA: Diagnosis not present

## 2016-08-01 DIAGNOSIS — S199XXA Unspecified injury of neck, initial encounter: Secondary | ICD-10-CM | POA: Diagnosis not present

## 2016-08-01 DIAGNOSIS — S92111A Displaced fracture of neck of right talus, initial encounter for closed fracture: Secondary | ICD-10-CM | POA: Diagnosis not present

## 2016-08-01 DIAGNOSIS — C61 Malignant neoplasm of prostate: Secondary | ICD-10-CM | POA: Diagnosis not present

## 2016-08-01 DIAGNOSIS — M25522 Pain in left elbow: Secondary | ICD-10-CM | POA: Diagnosis not present

## 2016-08-01 DIAGNOSIS — S91012A Laceration without foreign body, left ankle, initial encounter: Secondary | ICD-10-CM | POA: Diagnosis not present

## 2016-08-01 DIAGNOSIS — Z79899 Other long term (current) drug therapy: Secondary | ICD-10-CM

## 2016-08-01 DIAGNOSIS — M19041 Primary osteoarthritis, right hand: Secondary | ICD-10-CM | POA: Diagnosis present

## 2016-08-01 DIAGNOSIS — M79671 Pain in right foot: Secondary | ICD-10-CM | POA: Diagnosis not present

## 2016-08-01 DIAGNOSIS — S92111D Displaced fracture of neck of right talus, subsequent encounter for fracture with routine healing: Secondary | ICD-10-CM | POA: Diagnosis present

## 2016-08-01 DIAGNOSIS — S3991XA Unspecified injury of abdomen, initial encounter: Secondary | ICD-10-CM | POA: Diagnosis not present

## 2016-08-01 DIAGNOSIS — T148XXA Other injury of unspecified body region, initial encounter: Secondary | ICD-10-CM | POA: Diagnosis not present

## 2016-08-01 DIAGNOSIS — S8992XA Unspecified injury of left lower leg, initial encounter: Secondary | ICD-10-CM | POA: Diagnosis not present

## 2016-08-01 DIAGNOSIS — M79642 Pain in left hand: Secondary | ICD-10-CM | POA: Diagnosis not present

## 2016-08-01 DIAGNOSIS — S99922A Unspecified injury of left foot, initial encounter: Secondary | ICD-10-CM | POA: Diagnosis not present

## 2016-08-01 HISTORY — DX: Prediabetes: R73.03

## 2016-08-01 LAB — CBC WITH DIFFERENTIAL/PLATELET
Basophils Absolute: 0 10*3/uL (ref 0.0–0.1)
Basophils Relative: 0 %
Eosinophils Absolute: 0 10*3/uL (ref 0.0–0.7)
Eosinophils Relative: 0 %
HCT: 41.9 % (ref 39.0–52.0)
Hemoglobin: 14 g/dL (ref 13.0–17.0)
Lymphocytes Relative: 24 %
Lymphs Abs: 1.6 10*3/uL (ref 0.7–4.0)
MCH: 26.9 pg (ref 26.0–34.0)
MCHC: 33.4 g/dL (ref 30.0–36.0)
MCV: 80.4 fL (ref 78.0–100.0)
Monocytes Absolute: 0.5 10*3/uL (ref 0.1–1.0)
Monocytes Relative: 7 %
Neutro Abs: 4.5 10*3/uL (ref 1.7–7.7)
Neutrophils Relative %: 69 %
Platelets: 194 10*3/uL (ref 150–400)
RBC: 5.21 MIL/uL (ref 4.22–5.81)
RDW: 14.7 % (ref 11.5–15.5)
WBC: 6.7 10*3/uL (ref 4.0–10.5)

## 2016-08-01 LAB — COMPREHENSIVE METABOLIC PANEL
ALT: 54 U/L (ref 17–63)
AST: 79 U/L — ABNORMAL HIGH (ref 15–41)
Albumin: 4.1 g/dL (ref 3.5–5.0)
Alkaline Phosphatase: 50 U/L (ref 38–126)
Anion gap: 10 (ref 5–15)
BUN: 22 mg/dL — ABNORMAL HIGH (ref 6–20)
CO2: 25 mmol/L (ref 22–32)
Calcium: 9.2 mg/dL (ref 8.9–10.3)
Chloride: 105 mmol/L (ref 101–111)
Creatinine, Ser: 1.19 mg/dL (ref 0.61–1.24)
GFR calc Af Amer: >60 mL/min (ref >60)
GFR calc non Af Amer: >60 mL/min (ref >60)
Glucose, Bld: 127 mg/dL — ABNORMAL HIGH (ref 65–99)
Potassium: 4 mmol/L (ref 3.5–5.1)
Sodium: 140 mmol/L (ref 135–145)
Total Bilirubin: 1.1 mg/dL (ref 0.3–1.2)
Total Protein: 7.3 g/dL (ref 6.5–8.1)

## 2016-08-01 MED ORDER — ONDANSETRON HCL 4 MG/2ML IJ SOLN
4.0000 mg | Freq: Three times a day (TID) | INTRAMUSCULAR | Status: AC | PRN
  Administered 2016-08-01: 4 mg via INTRAVENOUS
  Filled 2016-08-01: qty 2

## 2016-08-01 MED ORDER — OXYCODONE HCL 5 MG PO TABS
5.0000 mg | ORAL_TABLET | ORAL | Status: DC | PRN
  Administered 2016-08-02 – 2016-08-03 (×3): 5 mg via ORAL
  Filled 2016-08-01 (×3): qty 1

## 2016-08-01 MED ORDER — MORPHINE SULFATE (PF) 4 MG/ML IV SOLN
4.0000 mg | Freq: Once | INTRAVENOUS | Status: AC
  Administered 2016-08-01: 4 mg via INTRAVENOUS
  Filled 2016-08-01: qty 1

## 2016-08-01 MED ORDER — FENTANYL CITRATE (PF) 100 MCG/2ML IJ SOLN
50.0000 ug | Freq: Once | INTRAMUSCULAR | Status: AC
  Administered 2016-08-01: 50 ug via INTRAVENOUS
  Filled 2016-08-01: qty 2

## 2016-08-01 MED ORDER — HYDROMORPHONE HCL 1 MG/ML IJ SOLN
1.0000 mg | INTRAMUSCULAR | Status: DC | PRN
  Administered 2016-08-01 – 2016-08-02 (×3): 1 mg via INTRAVENOUS
  Filled 2016-08-01 (×5): qty 1

## 2016-08-01 MED ORDER — LIDOCAINE-EPINEPHRINE (PF) 2 %-1:200000 IJ SOLN
10.0000 mL | Freq: Once | INTRAMUSCULAR | Status: AC
  Administered 2016-08-01: 10 mL
  Filled 2016-08-01: qty 20

## 2016-08-01 MED ORDER — IOPAMIDOL (ISOVUE-300) INJECTION 61%
INTRAVENOUS | Status: AC
  Administered 2016-08-01: 100 mL
  Filled 2016-08-01: qty 100
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
## 2016-08-01 NOTE — ED Notes (Signed)
Pt given ice packs for both ankles

## 2016-08-01 NOTE — ED Notes (Signed)
Pt requesting water, per Cristie Hem, PA, pt not able to drink until after consult with trauma and ortho surgery.
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
## 2016-08-01 NOTE — ED Notes (Signed)
Ortho tech paged by this nurse

## 2016-08-01 NOTE — ED Notes (Addendum)
Attempted to ambulate pt with crutches. Pt unable to bear weight or stand, in severe pain. Pt became SOB, dizzy, and nauseous when attempting to stand. Cristie Hem, PA aware. Pt to be admitted, pt and family aware of plan.

## 2016-08-01 NOTE — ED Notes (Signed)
Lidocaine at the bedside. Cristie Hem, PA aware.

## 2016-08-01 NOTE — Progress Notes (Signed)
Orthopedic Tech Progress Note Patient Details:  Juan Hunt March 07, 1950 [id number]  Ortho Devices Type of Ortho Device: Ace wrap, Post (short leg) splint Ortho Device/Splint Location: RLE Ortho Device/Splint Interventions: Ordered, Application   [name] 08/01/2016, 6:30 PM

## 2016-08-01 NOTE — ED Notes (Signed)
Pt given gingerale and ice water per [place], [place].

## 2016-08-01 NOTE — Progress Notes (Signed)
Orthopedic Tech Progress Note Patient Details:  Juan Hunt 1950/06/03 [id number]  Ortho Devices Type of Ortho Device: ASO Ortho Device/Splint Location: LLE Ortho Device/Splint Interventions: Ordered, Application   [name] 08/01/2016, 5:04 PM

## 2016-08-01 NOTE — ED Notes (Signed)
ED Provider at bedside. 

## 2016-08-01 NOTE — ED Notes (Signed)
Ortho tec at bedside. Pt wound dressed wit sterile gauze and gauze wrapping.

## 2016-08-02 ENCOUNTER — Other Ambulatory Visit (INDEPENDENT_AMBULATORY_CARE_PROVIDER_SITE_OTHER): Payer: Self-pay | Admitting: Family

## 2016-08-02 ENCOUNTER — Encounter (HOSPITAL_COMMUNITY): Payer: Self-pay | Admitting: General Practice

## 2016-08-02 LAB — COMPREHENSIVE METABOLIC PANEL
ALT: 53 U/L (ref 17–63)
ANION GAP: 8 (ref 5–15)
AST: 74 U/L — AB (ref 15–41)
Albumin: 3.7 g/dL (ref 3.5–5.0)
Alkaline Phosphatase: 50 U/L (ref 38–126)
BILIRUBIN TOTAL: 0.8 mg/dL (ref 0.3–1.2)
BUN: 20 mg/dL (ref 6–20)
CALCIUM: 8.7 mg/dL — AB (ref 8.9–10.3)
CO2: 25 mmol/L (ref 22–32)
CREATININE: 1.04 mg/dL (ref 0.61–1.24)
Chloride: 103 mmol/L (ref 101–111)
GFR calc non Af Amer: >60 mL/min (ref >60)
Glucose, Bld: 117 mg/dL — ABNORMAL HIGH (ref 65–99)
POTASSIUM: 4.3 mmol/L (ref 3.5–5.1)
Sodium: 136 mmol/L (ref 135–145)
TOTAL PROTEIN: 6.7 g/dL (ref 6.5–8.1)

## 2016-08-02 LAB — CBC
HEMATOCRIT: 39.5 % (ref 39.0–52.0)
Hemoglobin: 12.8 g/dL — ABNORMAL LOW (ref 13.0–17.0)
MCH: 26.9 pg (ref 26.0–34.0)
MCHC: 32.4 g/dL (ref 30.0–36.0)
MCV: 83 fL (ref 78.0–100.0)
PLATELETS: 179 10*3/uL (ref 150–400)
RBC: 4.76 MIL/uL (ref 4.22–5.81)
RDW: 15.5 % (ref 11.5–15.5)
WBC: 5.2 10*3/uL (ref 4.0–10.5)

## 2016-08-02 LAB — SURGICAL PCR SCREEN
MRSA, PCR: NEGATIVE
STAPHYLOCOCCUS AUREUS: POSITIVE — AB

## 2016-08-02 MED ORDER — HYDROMORPHONE HCL 1 MG/ML IJ SOLN
1.0000 mg | INTRAMUSCULAR | Status: DC | PRN
  Administered 2016-08-02 – 2016-08-04 (×10): 1 mg via INTRAVENOUS
  Filled 2016-08-02 (×8): qty 1

## 2016-08-02 MED ORDER — SODIUM CHLORIDE 0.9 % IV SOLN: INTRAVENOUS | Status: DC

## 2016-08-02 NOTE — Progress Notes (Signed)
Primary physician gave orders to elevate the feet above the level of the heart and apply ice packs on ankles every 4 hrs
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
## 2016-08-03 ENCOUNTER — Inpatient Hospital Stay (HOSPITAL_COMMUNITY): Payer: PPO | Admitting: Certified Registered Nurse Anesthetist

## 2016-08-03 ENCOUNTER — Encounter (HOSPITAL_COMMUNITY)
Admission: EM | Disposition: A | Discharge status: Home / Self Care | Payer: Self-pay | Source: Home / Self Care | Attending: Orthopedic Surgery

## 2016-08-03 ENCOUNTER — Encounter (HOSPITAL_COMMUNITY): Payer: Self-pay | Admitting: Surgery

## 2016-08-03 DIAGNOSIS — S92111A Displaced fracture of neck of right talus, initial encounter for closed fracture: Principal | ICD-10-CM

## 2016-08-03 HISTORY — PX: ORIF ANKLE FRACTURE: SHX5408

## 2016-08-03 LAB — GLUCOSE, CAPILLARY: GLUCOSE-CAPILLARY: 101 mg/dL — AB (ref 65–99)

## 2016-08-03 SURGERY — OPEN REDUCTION INTERNAL FIXATION (ORIF) ANKLE FRACTURE
Anesthesia: General | Laterality: Right

## 2016-08-03 MED ORDER — METHOCARBAMOL 500 MG PO TABS
500.0000 mg | ORAL_TABLET | Freq: Four times a day (QID) | ORAL | Status: DC | PRN
Start: 2016-08-03 — End: 2016-08-06
  Administered 2016-08-03 – 2016-08-04 (×2): 500 mg via ORAL
  Filled 2016-08-03 (×2): qty 1

## 2016-08-03 MED ORDER — ONDANSETRON HCL 4 MG/2ML IJ SOLN: 4.0000 mg | Freq: Four times a day (QID) | INTRAMUSCULAR | Status: DC | PRN

## 2016-08-03 MED ORDER — CHLORHEXIDINE GLUCONATE CLOTH 2 % EX PADS
6.0000 | MEDICATED_PAD | Freq: Every day | CUTANEOUS | Status: DC
  Administered 2016-08-03 – 2016-08-06 (×3): 6 via TOPICAL

## 2016-08-03 MED ORDER — LIDOCAINE HCL (CARDIAC) 20 MG/ML IV SOLN
INTRAVENOUS | Status: DC | PRN
  Administered 2016-08-03: 70 mg via INTRAVENOUS

## 2016-08-03 MED ORDER — METOCLOPRAMIDE HCL 5 MG PO TABS: 5.0000 mg | ORAL_TABLET | Freq: Three times a day (TID) | ORAL | Status: DC | PRN

## 2016-08-03 MED ORDER — MIDAZOLAM HCL 2 MG/2ML IJ SOLN
INTRAMUSCULAR | Status: AC
  Filled 2016-08-03: qty 2

## 2016-08-03 MED ORDER — FENTANYL CITRATE (PF) 250 MCG/5ML IJ SOLN
INTRAMUSCULAR | Status: AC
  Filled 2016-08-03: qty 5

## 2016-08-03 MED ORDER — HYDROMORPHONE HCL 1 MG/ML IJ SOLN
INTRAMUSCULAR | Status: AC
  Administered 2016-08-03: 0.5 mg via INTRAVENOUS
  Filled 2016-08-03: qty 1

## 2016-08-03 MED ORDER — CEFAZOLIN SODIUM-DEXTROSE 2-4 GM/100ML-% IV SOLN
2.0000 g | INTRAVENOUS | Status: AC
  Administered 2016-08-03: 2 g via INTRAVENOUS

## 2016-08-03 MED ORDER — OXYCODONE HCL 5 MG PO TABS
5.0000 mg | ORAL_TABLET | ORAL | Status: DC | PRN
  Administered 2016-08-04 (×2): 10 mg via ORAL
  Administered 2016-08-06 (×4): 5 mg via ORAL
  Filled 2016-08-03: qty 2
  Filled 2016-08-03: qty 1
  Filled 2016-08-03 (×3): qty 2
  Filled 2016-08-03 (×2): qty 1
  Filled 2016-08-03 (×2): qty 2

## 2016-08-03 MED ORDER — ONDANSETRON HCL 4 MG PO TABS: 4.0000 mg | ORAL_TABLET | Freq: Four times a day (QID) | ORAL | Status: DC | PRN

## 2016-08-03 MED ORDER — ACETAMINOPHEN 650 MG RE SUPP: 650.0000 mg | Freq: Four times a day (QID) | RECTAL | Status: DC | PRN

## 2016-08-03 MED ORDER — ONDANSETRON HCL 4 MG/2ML IJ SOLN
INTRAMUSCULAR | Status: DC | PRN
  Administered 2016-08-03: 4 mg via INTRAVENOUS

## 2016-08-03 MED ORDER — ONDANSETRON HCL 4 MG/2ML IJ SOLN
4.0000 mg | Freq: Four times a day (QID) | INTRAMUSCULAR | Status: DC | PRN
  Filled 2016-08-03: qty 2

## 2016-08-03 MED ORDER — POLYETHYLENE GLYCOL 3350 17 G PO PACK: 17.0000 g | PACK | Freq: Every day | ORAL | Status: DC | PRN

## 2016-08-03 MED ORDER — OXYCODONE HCL 5 MG/5ML PO SOLN: 5.0000 mg | Freq: Once | ORAL | Status: DC | PRN

## 2016-08-03 MED ORDER — METOCLOPRAMIDE HCL 5 MG/ML IJ SOLN: 5.0000 mg | Freq: Three times a day (TID) | INTRAMUSCULAR | Status: DC | PRN

## 2016-08-03 MED ORDER — DEXTROSE 5 % IV SOLN: 500.0000 mg | Freq: Four times a day (QID) | INTRAVENOUS | Status: DC | PRN

## 2016-08-03 MED ORDER — DOCUSATE SODIUM 100 MG PO CAPS
100.0000 mg | ORAL_CAPSULE | Freq: Two times a day (BID) | ORAL | Status: DC
  Administered 2016-08-03 – 2016-08-06 (×5): 100 mg via ORAL
  Filled 2016-08-03 (×6): qty 1

## 2016-08-03 MED ORDER — HYDROMORPHONE HCL 1 MG/ML IJ SOLN
0.2500 mg | INTRAMUSCULAR | Status: DC | PRN
  Administered 2016-08-03 (×4): 0.5 mg via INTRAVENOUS

## 2016-08-03 MED ORDER — ACETAMINOPHEN 325 MG PO TABS: 650.0000 mg | ORAL_TABLET | Freq: Four times a day (QID) | ORAL | Status: DC | PRN

## 2016-08-03 MED ORDER — MUPIROCIN 2 % EX OINT
1.0000 "application " | TOPICAL_OINTMENT | Freq: Two times a day (BID) | CUTANEOUS | Status: DC
  Administered 2016-08-03 – 2016-08-06 (×7): 1 via NASAL
  Filled 2016-08-03 (×4): qty 22

## 2016-08-03 MED ORDER — MIDAZOLAM HCL 5 MG/5ML IJ SOLN
INTRAMUSCULAR | Status: DC | PRN
  Administered 2016-08-03: 2 mg via INTRAVENOUS

## 2016-08-03 MED ORDER — HYDROMORPHONE HCL 1 MG/ML IJ SOLN
INTRAMUSCULAR | Status: AC
  Filled 2016-08-03: qty 1

## 2016-08-03 MED ORDER — 0.9 % SODIUM CHLORIDE (POUR BTL) OPTIME
TOPICAL | Status: DC | PRN
  Administered 2016-08-03: 1000 mL

## 2016-08-03 MED ORDER — LACTATED RINGERS IV SOLN
INTRAVENOUS | Status: DC
  Administered 2016-08-03 (×2): via INTRAVENOUS

## 2016-08-03 MED ORDER — MAGNESIUM CITRATE PO SOLN: 1.0000 | Freq: Once | ORAL | Status: DC | PRN

## 2016-08-03 MED ORDER — ASPIRIN EC 325 MG PO TBEC
325.0000 mg | DELAYED_RELEASE_TABLET | Freq: Every day | ORAL | Status: DC
  Administered 2016-08-04 – 2016-08-06 (×3): 325 mg via ORAL
  Filled 2016-08-03 (×3): qty 1

## 2016-08-03 MED ORDER — CEFAZOLIN SODIUM-DEXTROSE 1-4 GM/50ML-% IV SOLN
1.0000 g | Freq: Four times a day (QID) | INTRAVENOUS | Status: AC
  Administered 2016-08-04 (×3): 1 g via INTRAVENOUS
  Filled 2016-08-03 (×3): qty 50

## 2016-08-03 MED ORDER — PROPOFOL 10 MG/ML IV BOLUS
INTRAVENOUS | Status: DC | PRN
  Administered 2016-08-03: 200 mg via INTRAVENOUS

## 2016-08-03 MED ORDER — KETOROLAC TROMETHAMINE 15 MG/ML IJ SOLN
7.5000 mg | Freq: Four times a day (QID) | INTRAMUSCULAR | Status: AC
  Administered 2016-08-04 (×4): 7.5 mg via INTRAVENOUS
  Filled 2016-08-03 (×4): qty 1

## 2016-08-03 MED ORDER — BISACODYL 10 MG RE SUPP
10.0000 mg | Freq: Every day | RECTAL | Status: DC | PRN
  Administered 2016-08-05: 10 mg via RECTAL
  Filled 2016-08-03: qty 1

## 2016-08-03 MED ORDER — HYDROMORPHONE HCL 1 MG/ML IJ SOLN
1.0000 mg | INTRAMUSCULAR | Status: DC | PRN
  Administered 2016-08-04 – 2016-08-05 (×4): 1 mg via INTRAVENOUS
  Filled 2016-08-03 (×6): qty 1

## 2016-08-03 MED ORDER — FENTANYL CITRATE (PF) 100 MCG/2ML IJ SOLN
INTRAMUSCULAR | Status: DC | PRN
  Administered 2016-08-03 (×3): 50 ug via INTRAVENOUS

## 2016-08-03 MED ORDER — CHLORHEXIDINE GLUCONATE 4 % EX LIQD: 60.0000 mL | Freq: Once | CUTANEOUS | Status: DC

## 2016-08-03 MED ORDER — OXYCODONE HCL 5 MG PO TABS: 5.0000 mg | ORAL_TABLET | Freq: Once | ORAL | Status: DC | PRN

## 2016-08-03 SURGICAL SUPPLY — 45 items
BANDAGE ESMARK 6X9 LF (GAUZE/BANDAGES/DRESSINGS) IMPLANT
BIT DRILL LCP QC 2X140 (BIT) ×3 IMPLANT
BNDG COHESIVE 4X5 TAN STRL (GAUZE/BANDAGES/DRESSINGS) ×3 IMPLANT
BNDG ESMARK 6X9 LF (GAUZE/BANDAGES/DRESSINGS)
BNDG GAUZE ELAST 4 BULKY (GAUZE/BANDAGES/DRESSINGS) ×3 IMPLANT
BONE CHIP PRESERV 40CC PCAN1/2 (Bone Implant) ×3 IMPLANT
CANISTER PREVENA PLUS 150 (CANNISTER) ×3 IMPLANT
COVER SURGICAL LIGHT HANDLE (MISCELLANEOUS) ×3 IMPLANT
DRAPE INCISE IOBAN 66X45 STRL (DRAPES) ×3 IMPLANT
DRAPE OEC MINIVIEW 54X84 (DRAPES) ×3 IMPLANT
DRAPE U-SHAPE 47X51 STRL (DRAPES) ×3 IMPLANT
DRESSING PREVENA PLUS CUSTOM (GAUZE/BANDAGES/DRESSINGS) ×1 IMPLANT
DRSG ADAPTIC 3X8 NADH LF (GAUZE/BANDAGES/DRESSINGS) ×3 IMPLANT
DRSG MEPILEX BORDER 4X4 (GAUZE/BANDAGES/DRESSINGS) ×3 IMPLANT
DRSG PAD ABDOMINAL 8X10 ST (GAUZE/BANDAGES/DRESSINGS) ×3 IMPLANT
DRSG PREVENA PLUS CUSTOM (GAUZE/BANDAGES/DRESSINGS) ×3
DURAPREP 26ML APPLICATOR (WOUND CARE) ×3 IMPLANT
ELECT REM PT RETURN 9FT ADLT (ELECTROSURGICAL) ×3
ELECTRODE REM PT RTRN 9FT ADLT (ELECTROSURGICAL) ×1 IMPLANT
GAUZE SPONGE 4X4 12PLY STRL (GAUZE/BANDAGES/DRESSINGS) IMPLANT
GLOVE BIOGEL PI IND STRL 9 (GLOVE) ×1 IMPLANT
GLOVE BIOGEL PI INDICATOR 9 (GLOVE) ×2
GLOVE SURG ORTHO 9.0 STRL STRW (GLOVE) ×3 IMPLANT
GOWN STRL REUS W/ TWL XL LVL3 (GOWN DISPOSABLE) ×3 IMPLANT
GOWN STRL REUS W/TWL XL LVL3 (GOWN DISPOSABLE) ×6
KIT BASIN OR (CUSTOM PROCEDURE TRAY) ×3 IMPLANT
KIT PREVENA INCISION MGT 13 (CANNISTER) ×6 IMPLANT
KIT ROOM TURNOVER OR (KITS) ×3 IMPLANT
MANIFOLD NEPTUNE II (INSTRUMENTS) IMPLANT
NS IRRIG 1000ML POUR BTL (IV SOLUTION) ×3 IMPLANT
PACK ORTHO EXTREMITY (CUSTOM PROCEDURE TRAY) ×3 IMPLANT
PAD ARMBOARD 7.5X6 YLW CONV (MISCELLANEOUS) ×6 IMPLANT
PLATE TALUS 2.4/2.7 (Plate) ×3 IMPLANT
SCREW LOCK VA ST 2.7X20 (Screw) ×6 IMPLANT
SCREW LOCK VA ST 2.7X26 (Screw) ×6 IMPLANT
STAPLER VISISTAT 35W (STAPLE) IMPLANT
SUCTION FRAZIER HANDLE 10FR (MISCELLANEOUS) ×2
SUCTION TUBE FRAZIER 10FR DISP (MISCELLANEOUS) ×1 IMPLANT
SUT ETHILON 2 0 PSLX (SUTURE) IMPLANT
SUT VIC AB 2-0 CT1 27 (SUTURE) ×2
SUT VIC AB 2-0 CT1 TAPERPNT 27 (SUTURE) ×1 IMPLANT
TOWEL OR 17X24 6PK STRL BLUE (TOWEL DISPOSABLE) ×3 IMPLANT
TOWEL OR 17X26 10 PK STRL BLUE (TOWEL DISPOSABLE) ×3 IMPLANT
TUBE CONNECTING 12'X1/4 (SUCTIONS) ×1
TUBE CONNECTING 12X1/4 (SUCTIONS) ×2 IMPLANT
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
## 2016-08-03 NOTE — Anesthesia Procedure Notes (Signed)
Procedure Name: LMA Insertion Date/Time: 08/03/2016 6:11 PM Performed by: Shirlyn Goltz Pre-anesthesia Checklist: Patient identified, Emergency Drugs available, Suction available and Patient being monitored Patient Re-evaluated:Patient Re-evaluated prior to induction Oxygen Delivery Method: Circle system utilized Preoxygenation: Pre-oxygenation with 100% oxygen Induction Type: IV induction LMA: LMA inserted LMA Size: 5.0 Number of attempts: 1 Placement Confirmation: positive ETCO2 and breath sounds checked- equal and bilateral Tube secured with: Tape Dental Injury: Teeth and Oropharynx as per pre-operative assessment
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
## 2016-08-03 NOTE — Progress Notes (Signed)
Plan for discharge to home on Monday nonweightbearing on the right weightbearing as tolerated on the left with a ASO brace.

## 2016-08-03 NOTE — Transfer of Care (Signed)
Immediate Anesthesia Transfer of Care Note  Patient: Juan Hunt  Procedure(s) Performed: Procedure(s): OPEN REDUCTION INTERNAL FIXATION (ORIF) RIGHT TALAR FRACTURE (Right)  Patient Location: PACU  Anesthesia Type:General  Level of Consciousness: awake and alert   Airway & Oxygen Therapy: Patient Spontanous Breathing and Patient connected to face mask oxygen  Post-op Assessment: Report given to RN, Post -op Vital signs reviewed and stable and Patient moving all extremities  Post vital signs: Reviewed and stable  Last Vitals:  Vitals:   08/03/16 0352 08/03/16 1613  BP: (!) 145/64 131/64  Pulse: 78 66  Resp: 18 18  Temp: 37.2 C 36.9 C    Last Pain:  Vitals:   08/03/16 1613  TempSrc: Oral  PainSc:      HR 82, RR 19, sats 99%, BP 154/84  Patients Stated Pain Goal: 2 (88/89/16 9450)  Complications: No apparent anesthesia complications
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
## 2016-08-03 NOTE — Anesthesia Preprocedure Evaluation (Signed)
Anesthesia Evaluation  Patient identified by MRN, date of birth, ID band Patient awake    Reviewed: Allergy & Precautions, H&P , NPO status , Patient's Chart, lab work & pertinent test results  Airway Mallampati: II   Neck ROM: full    Dental   Pulmonary former smoker,    breath sounds clear to auscultation       Cardiovascular negative cardio ROS   Rhythm:regular Rate:Normal     Neuro/Psych    GI/Hepatic GERD  ,  Endo/Other    Renal/GU      Musculoskeletal  (+) Arthritis ,   Abdominal   Peds  Hematology   Anesthesia Other Findings   Reproductive/Obstetrics                             Anesthesia Physical Anesthesia Plan  ASA: II  Anesthesia Plan: General   Post-op Pain Management:    Induction: Intravenous  PONV Risk Score and Plan: 3 and Ondansetron, Dexamethasone, Propofol, Midazolam and Treatment may vary due to age or medical condition  Airway Management Planned: LMA  Additional Equipment:   Intra-op Plan:   Post-operative Plan:   Informed Consent: I have reviewed the patients History and Physical, chart, labs and discussed the procedure including the risks, benefits and alternatives for the proposed anesthesia with the patient or authorized representative who has indicated his/her understanding and acceptance.     Plan Discussed with: CRNA, Anesthesiologist and Surgeon  Anesthesia Plan Comments:         Anesthesia Quick Evaluation
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
## 2016-08-04 NOTE — Progress Notes (Signed)
Subjective: Patient stable.  Pain control.  Wound VAC functional   Objective: Vital signs in last 24 hours: Temp:  [97.5 F (36.4 C)-99 F (37.2 C)] 99 F (37.2 C) (07/28 0500) Pulse Rate:  [66-83] 72 (07/28 0500) Resp:  [14-18] 16 (07/28 0500) BP: (131-173)/(64-89) 153/68 (07/28 0500) SpO2:  [90 %-98 %] 98 % (07/28 0500)  Intake/Output from previous day: 07/27 0701 - 07/28 0700 In: 1320 [P.O.:120; I.V.:1000; IV Piggyback:50] Out: 925 [Urine:850; Blood:75] Intake/Output this shift: No intake/output data recorded.  Exam:  Compartment soft  Labs:  Recent Labs  08/01/16 1245 08/02/16 0954  HGB 14.0 12.8*    Recent Labs  08/01/16 1245 08/02/16 0954  WBC 6.7 5.2  RBC 5.21 4.76  HCT 41.9 39.5  PLT 194 179    Recent Labs  08/01/16 1245 08/02/16 0954  NA 140 136  K 4.0 4.3  CL 105 103  CO2 25 25  BUN 22* 20  CREATININE 1.19 1.04  GLUCOSE 127* 117*  CALCIUM 9.2 8.7*   No results for input(s): LABPT, INR in the last 72 hours.  Assessment/Plan: Plan wound VAC this weekend with discharge on Monday.   G Scott Vonnie Spagnolo 08/04/2016, 8:05 AM

## 2016-08-04 NOTE — Progress Notes (Signed)
Orthopedic Tech Progress Note Patient Details:  Juan Hunt 06/12/1950 [id number]  Ortho Devices Type of Ortho Device: Postop shoe/boot Ortho Device/Splint Location: RLE Ortho Device/Splint Interventions: Application   [name] 08/04/2016, 1:10 PM

## 2016-08-05 NOTE — Progress Notes (Signed)
Patient requested RN to remove IV access stating the site has been irritating him all day. Patient is refusing IV replacement stating that he is going home on tomorrow. Nursing will continue to monitor.
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
## 2016-08-05 NOTE — Progress Notes (Signed)
Wound VAC is functional and pain is improved compared to yesterday.  Plan discharge next week per Dr. Sharol Given

## 2016-08-06 ENCOUNTER — Encounter (HOSPITAL_COMMUNITY): Payer: Self-pay | Admitting: Orthopedic Surgery

## 2016-08-06 MED ORDER — OXYCODONE-ACETAMINOPHEN 5-325 MG PO TABS: 1.0000 | ORAL_TABLET | ORAL | 0 refills | Status: DC | PRN
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
## 2016-08-06 NOTE — Care Management Important Message (Signed)
Important Message  Patient Details  Name: Juan Hunt MRN: [id number] Date of Birth: 02-11-1950   Medicare Important Message Given:  Yes    [name] [address] 08/06/2016, 3:34 PM

## 2016-08-06 NOTE — Care Management Important Message (Signed)
Important Message  Patient Details  Name: Juan Hunt MRN: [id number] Date of Birth: 19-May-1950   Medicare Important Message Given:  Yes    Erenest Rasher, RN 08/06/2016, 11:03 AM
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
## 2016-08-06 NOTE — Progress Notes (Addendum)
.      Durable Medical Equipment        Start     Ordered   08/06/16 [phone]  For home use only DME lightweight manual wheelchair with seat cushion  Once    Comments:  Patient suffers from right talus fracture which impairs their ability to perform daily activities like walking, standing, bathing, grooming in the home.  A rolling walker will not resolve  issue with performing activities of daily living. A wheelchair will allow patient to safely perform daily activities. Patient is not able to propel themselves in the home using a standard weight wheelchair due to pain, NWB. Patient can self propel in the lightweight wheelchair.  Accessories: elevating leg rests (ELRs), wheel locks, extensions and anti-tippers.   08/06/16 0954   08/06/16 0953  For home use only DME 3 n 1  Once     08/06/16 0953   08/06/16 0949  For home use only DME Walker rolling  Once    Question:  Patient needs a walker to treat with the following condition  Answer:  Talus fracture   08/06/16 3329

## 2016-08-06 NOTE — Progress Notes (Signed)
.      Durable Medical Equipment        Start     Ordered   08/06/16 [phone]  For home use only DME lightweight manual wheelchair with seat cushion  Once    Comments:  Patient suffers from right talus fracture which impairs their ability to perform daily activities like walking, standing, bathing, grooming in the home.  A rolling walker will not resolve  issue with performing activities of daily living. A wheelchair will allow patient to safely perform daily activities. Patient is not able to propel themselves in the home using a standard weight wheelchair due to pain, NWB. Patient can self propel in the lightweight wheelchair.  Accessories: elevating leg rests (ELRs), wheel locks, extensions and anti-tippers.   08/06/16 0954   08/06/16 0953  For home use only DME 3 n 1  Once     08/06/16 0953   08/06/16 0949  For home use only DME Walker rolling  Once    Question:  Patient needs a walker to treat with the following condition  Answer:  Talus fracture   08/06/16 5638

## 2016-08-06 NOTE — Progress Notes (Signed)
Pt ready for d/c home today per MD. Pt met PT goals, all equipment was delivered to hospital room. Discharge instructions and prescriptions reviewed with pt and wife, all questions answered. Belongings sent with pt.   Juan Hunt, Juan Hunt

## 2016-08-06 NOTE — Anesthesia Postprocedure Evaluation (Signed)
Anesthesia Post Note  Patient: Juan Hunt  Procedure(s) Performed: Procedure(s) (LRB): OPEN REDUCTION INTERNAL FIXATION (ORIF) RIGHT TALAR FRACTURE (Right)     Patient location during evaluation: PACU Anesthesia Type: General Level of consciousness: awake and alert and patient cooperative Pain management: pain level controlled Vital Signs Assessment: post-procedure vital signs reviewed and stable Respiratory status: spontaneous breathing and respiratory function stable Cardiovascular status: stable Anesthetic complications: no    Last Vitals:  Vitals:   08/05/16 2045 08/06/16 0532  BP: (!) 162/66 129/67  Pulse: 80 70  Resp: 18 18  Temp: 37.3 C 37.2 C    Last Pain:  Vitals:   08/06/16 0532  TempSrc: Oral  PainSc:                  [place]

## 2016-08-07 ENCOUNTER — Encounter (HOSPITAL_COMMUNITY): Payer: Self-pay | Admitting: Orthopedic Surgery

## 2016-08-10 ENCOUNTER — Ambulatory Visit (INDEPENDENT_AMBULATORY_CARE_PROVIDER_SITE_OTHER): Payer: PPO | Admitting: Family

## 2016-08-10 ENCOUNTER — Encounter (INDEPENDENT_AMBULATORY_CARE_PROVIDER_SITE_OTHER): Payer: Self-pay | Admitting: Family

## 2016-08-10 VITALS — Ht 70.0 in | Wt 220.0 lb

## 2016-08-10 DIAGNOSIS — S93402D Sprain of unspecified ligament of left ankle, subsequent encounter: Secondary | ICD-10-CM

## 2016-08-10 DIAGNOSIS — S91012D Laceration without foreign body, left ankle, subsequent encounter: Secondary | ICD-10-CM

## 2016-08-10 DIAGNOSIS — S92111A Displaced fracture of neck of right talus, initial encounter for closed fracture: Secondary | ICD-10-CM
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
## 2016-08-14 ENCOUNTER — Other Ambulatory Visit (INDEPENDENT_AMBULATORY_CARE_PROVIDER_SITE_OTHER): Payer: Self-pay

## 2016-08-14 ENCOUNTER — Ambulatory Visit (HOSPITAL_COMMUNITY)
Admission: RE | Admit: 2016-08-14 | Discharge: 2016-08-14 | Disposition: A | Discharge status: Home / Self Care | Payer: PPO | Source: Ambulatory Visit | Attending: Cardiovascular Disease | Admitting: Cardiovascular Disease

## 2016-08-14 ENCOUNTER — Telehealth (INDEPENDENT_AMBULATORY_CARE_PROVIDER_SITE_OTHER): Payer: Self-pay | Admitting: Orthopedic Surgery

## 2016-08-14 ENCOUNTER — Other Ambulatory Visit (INDEPENDENT_AMBULATORY_CARE_PROVIDER_SITE_OTHER): Payer: Self-pay | Admitting: Orthopedic Surgery

## 2016-08-14 DIAGNOSIS — M79661 Pain in right lower leg: Secondary | ICD-10-CM | POA: Diagnosis present

## 2016-08-14 DIAGNOSIS — I824Z3 Acute embolism and thrombosis of unspecified deep veins of distal lower extremity, bilateral: Secondary | ICD-10-CM | POA: Insufficient documentation

## 2016-08-14 DIAGNOSIS — M79662 Pain in left lower leg: Secondary | ICD-10-CM

## 2016-08-14 DIAGNOSIS — K219 Gastro-esophageal reflux disease without esophagitis: Secondary | ICD-10-CM | POA: Diagnosis not present

## 2016-08-14 DIAGNOSIS — M79609 Pain in unspecified limb: Secondary | ICD-10-CM | POA: Diagnosis not present

## 2016-08-14 DIAGNOSIS — Z8546 Personal history of malignant neoplasm of prostate: Secondary | ICD-10-CM | POA: Diagnosis not present

## 2016-08-14 DIAGNOSIS — S63501D Unspecified sprain of right wrist, subsequent encounter: Secondary | ICD-10-CM | POA: Diagnosis not present

## 2016-08-14 DIAGNOSIS — I82409 Acute embolism and thrombosis of unspecified deep veins of unspecified lower extremity: Secondary | ICD-10-CM | POA: Diagnosis not present

## 2016-08-14 DIAGNOSIS — Z87891 Personal history of nicotine dependence: Secondary | ICD-10-CM | POA: Diagnosis not present

## 2016-08-14 DIAGNOSIS — S92111D Displaced fracture of neck of right talus, subsequent encounter for fracture with routine healing: Secondary | ICD-10-CM | POA: Diagnosis not present

## 2016-08-14 MED ORDER — RIVAROXABAN (XARELTO) VTE STARTER PACK (15 & 20 MG)
ORAL_TABLET | ORAL | 0 refills | Status: DC
Start: 2016-08-14 — End: 2016-10-24

## 2016-08-14 NOTE — Telephone Encounter (Signed)
Pt has appt scheduled today 08/14/16 at 4pm at [facility], IC and SW both pt and wife and aware of appt

## 2016-08-14 NOTE — Telephone Encounter (Signed)
Called and lmon vm for pt to advise that procedure sch will be calling with an appt  To get him sch today for u/s and it will be a call report so we can instruct him what to do prior to him leaving facility.

## 2016-08-14 NOTE — Telephone Encounter (Signed)
Dr. Sharol Given aware that pt is positive for BLE DVT and he has started the pt on Xarelto. This was called into pharm 15 mg bid with food x 21 days on day 22 20mg  with food daily. Qty #51 called into pharm. Pt was advised before leaving facility.
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
## 2016-08-14 NOTE — Telephone Encounter (Signed)
Juan Hunt (PT) with [facility] called advised she saw patient this morning and patient is complaining of Bil calf pain. She advised patient could not tolerate her touching his calves. Marzetta Board advised she is concerned. The number to contact Marzetta Board is [phone]

## 2016-08-16 DIAGNOSIS — K219 Gastro-esophageal reflux disease without esophagitis: Secondary | ICD-10-CM | POA: Diagnosis not present

## 2016-08-16 DIAGNOSIS — S63501D Unspecified sprain of right wrist, subsequent encounter: Secondary | ICD-10-CM | POA: Diagnosis not present

## 2016-08-16 DIAGNOSIS — Z8546 Personal history of malignant neoplasm of prostate: Secondary | ICD-10-CM | POA: Diagnosis not present

## 2016-08-16 DIAGNOSIS — S92111D Displaced fracture of neck of right talus, subsequent encounter for fracture with routine healing: Secondary | ICD-10-CM | POA: Diagnosis not present

## 2016-08-16 DIAGNOSIS — Z87891 Personal history of nicotine dependence: Secondary | ICD-10-CM | POA: Diagnosis not present

## 2016-08-17 ENCOUNTER — Ambulatory Visit (INDEPENDENT_AMBULATORY_CARE_PROVIDER_SITE_OTHER): Payer: PPO

## 2016-08-17 ENCOUNTER — Ambulatory Visit (INDEPENDENT_AMBULATORY_CARE_PROVIDER_SITE_OTHER): Payer: PPO | Admitting: Orthopedic Surgery

## 2016-08-17 ENCOUNTER — Telehealth (INDEPENDENT_AMBULATORY_CARE_PROVIDER_SITE_OTHER): Payer: Self-pay

## 2016-08-17 ENCOUNTER — Encounter (INDEPENDENT_AMBULATORY_CARE_PROVIDER_SITE_OTHER): Payer: Self-pay | Admitting: Orthopedic Surgery

## 2016-08-17 VITALS — Ht 70.0 in | Wt 220.0 lb

## 2016-08-17 DIAGNOSIS — M25571 Pain in right ankle and joints of right foot: Secondary | ICD-10-CM

## 2016-08-17 DIAGNOSIS — S92111A Displaced fracture of neck of right talus, initial encounter for closed fracture: Secondary | ICD-10-CM

## 2016-08-17 NOTE — Telephone Encounter (Signed)
Talked with patient and advised him of message concerning appointment with Dr. Sharol Given.  Patient was rescheduled for an earlier time.

## 2016-08-17 NOTE — Telephone Encounter (Signed)
-----   Message from Suzan Slick, NP sent at 08/16/2016  4:48 PM EDT ----- Please call and reschedule tomorrows appointment if he wants to see duda, duda will be in surgery
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
## 2016-08-22 ENCOUNTER — Encounter: Payer: Self-pay | Admitting: Family Medicine

## 2016-08-22 ENCOUNTER — Ambulatory Visit (INDEPENDENT_AMBULATORY_CARE_PROVIDER_SITE_OTHER): Payer: PPO | Admitting: Family Medicine

## 2016-08-22 VITALS — BP 126/62 | HR 69 | Temp 98.0°F

## 2016-08-22 DIAGNOSIS — I824Z3 Acute embolism and thrombosis of unspecified deep veins of distal lower extremity, bilateral: Secondary | ICD-10-CM

## 2016-08-22 DIAGNOSIS — S025XXA Fracture of tooth (traumatic), initial encounter for closed fracture: Secondary | ICD-10-CM | POA: Insufficient documentation

## 2016-08-22 DIAGNOSIS — M25562 Pain in left knee: Secondary | ICD-10-CM | POA: Insufficient documentation

## 2016-08-22 DIAGNOSIS — M25522 Pain in left elbow: Secondary | ICD-10-CM | POA: Insufficient documentation

## 2016-08-22 DIAGNOSIS — S92111D Displaced fracture of neck of right talus, subsequent encounter for fracture with routine healing: Secondary | ICD-10-CM | POA: Diagnosis not present

## 2016-08-22 DIAGNOSIS — Z86718 Personal history of other venous thrombosis and embolism: Secondary | ICD-10-CM | POA: Insufficient documentation
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
## 2016-08-22 NOTE — Assessment & Plan Note (Signed)
Anticipate knee strain - supportive care reviewed.
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
## 2016-08-22 NOTE — Assessment & Plan Note (Signed)
F/u planned with dentist

## 2016-08-22 NOTE — Patient Instructions (Addendum)
I'm glad you're recovering well! Take one day at a time.  Return in 3 months for follow up visit.  Continue therapy at home.

## 2016-08-22 NOTE — Assessment & Plan Note (Signed)
Anticipate biceps strain, supportive care reviewed.

## 2016-08-22 NOTE — Assessment & Plan Note (Signed)
Bilateral provoked symptomatic distal DVT. He is already on xarelto loading dose for next 3 wks. Reviewed with patient. I did suggest f/u 3 mo to review possible discontinuation but will defer to Dr Sharol Given re f/u.

## 2016-08-23 ENCOUNTER — Telehealth (INDEPENDENT_AMBULATORY_CARE_PROVIDER_SITE_OTHER): Payer: Self-pay

## 2016-08-23 DIAGNOSIS — S92111D Displaced fracture of neck of right talus, subsequent encounter for fracture with routine healing: Secondary | ICD-10-CM | POA: Diagnosis not present

## 2016-08-23 DIAGNOSIS — K219 Gastro-esophageal reflux disease without esophagitis: Secondary | ICD-10-CM | POA: Diagnosis not present

## 2016-08-23 DIAGNOSIS — Z8546 Personal history of malignant neoplasm of prostate: Secondary | ICD-10-CM | POA: Diagnosis not present

## 2016-08-23 DIAGNOSIS — S63501D Unspecified sprain of right wrist, subsequent encounter: Secondary | ICD-10-CM | POA: Diagnosis not present

## 2016-08-23 DIAGNOSIS — Z87891 Personal history of nicotine dependence: Secondary | ICD-10-CM | POA: Diagnosis not present

## 2016-08-23 NOTE — Telephone Encounter (Signed)
Patient appointment is rescheduled for 08/24/16 @ 8:30am with Theodosia Paling.

## 2016-08-23 NOTE — Telephone Encounter (Signed)
-----   Message from Suzan Slick, NP sent at 08/23/2016 10:25 AM EDT ----- Will you reschedule this patient with duda

## 2016-08-24 ENCOUNTER — Encounter (INDEPENDENT_AMBULATORY_CARE_PROVIDER_SITE_OTHER): Payer: Self-pay | Admitting: Family

## 2016-08-24 ENCOUNTER — Ambulatory Visit (INDEPENDENT_AMBULATORY_CARE_PROVIDER_SITE_OTHER): Payer: PPO | Admitting: Orthopedic Surgery

## 2016-08-24 ENCOUNTER — Ambulatory Visit (INDEPENDENT_AMBULATORY_CARE_PROVIDER_SITE_OTHER): Payer: PPO | Admitting: Family

## 2016-08-24 VITALS — Ht 70.0 in | Wt 220.0 lb

## 2016-08-24 DIAGNOSIS — S92111D Displaced fracture of neck of right talus, subsequent encounter for fracture with routine healing: Secondary | ICD-10-CM

## 2016-08-24 MED ORDER — OXYCODONE-ACETAMINOPHEN 5-325 MG PO TABS: 1.0000 | ORAL_TABLET | Freq: Four times a day (QID) | ORAL | 0 refills | Status: DC | PRN
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
## 2016-08-28 ENCOUNTER — Other Ambulatory Visit: Payer: PPO

## 2016-08-28 DIAGNOSIS — C61 Malignant neoplasm of prostate: Secondary | ICD-10-CM

## 2016-08-29 LAB — BASIC METABOLIC PANEL
BUN/Creatinine Ratio: 15 (ref 10–24)
BUN: 16 mg/dL (ref 8–27)
CO2: 24 mmol/L (ref 20–29)
CREATININE: 1.04 mg/dL (ref 0.76–1.27)
Calcium: 9.5 mg/dL (ref 8.6–10.2)
Chloride: 99 mmol/L (ref 96–106)
GFR, EST AFRICAN AMERICAN: 86 mL/min/{1.73_m2} (ref >59)
GFR, EST NON AFRICAN AMERICAN: 74 mL/min/{1.73_m2} (ref >59)
Glucose: 86 mg/dL (ref 65–99)
Potassium: 4.4 mmol/L (ref 3.5–5.2)
SODIUM: 139 mmol/L (ref 134–144)

## 2016-08-29 LAB — PSA: Prostate Specific Ag, Serum: 6.6 ng/mL — ABNORMAL HIGH (ref 0.0–4.0)

## 2016-08-31 DIAGNOSIS — S63501D Unspecified sprain of right wrist, subsequent encounter: Secondary | ICD-10-CM | POA: Diagnosis not present

## 2016-08-31 DIAGNOSIS — S92111D Displaced fracture of neck of right talus, subsequent encounter for fracture with routine healing: Secondary | ICD-10-CM | POA: Diagnosis not present

## 2016-08-31 DIAGNOSIS — Z87891 Personal history of nicotine dependence: Secondary | ICD-10-CM | POA: Diagnosis not present

## 2016-08-31 DIAGNOSIS — Z8546 Personal history of malignant neoplasm of prostate: Secondary | ICD-10-CM | POA: Diagnosis not present

## 2016-08-31 DIAGNOSIS — K219 Gastro-esophageal reflux disease without esophagitis: Secondary | ICD-10-CM | POA: Diagnosis not present

## 2016-09-04 ENCOUNTER — Telehealth: Payer: Self-pay

## 2016-09-04 ENCOUNTER — Ambulatory Visit (INDEPENDENT_AMBULATORY_CARE_PROVIDER_SITE_OTHER): Payer: PPO | Admitting: Urology

## 2016-09-04 ENCOUNTER — Encounter: Payer: Self-pay | Admitting: Urology

## 2016-09-04 VITALS — BP 129/67 | HR 78 | Ht 70.0 in | Wt 220.0 lb

## 2016-09-04 DIAGNOSIS — C61 Malignant neoplasm of prostate: Secondary | ICD-10-CM | POA: Diagnosis not present

## 2016-09-04 NOTE — Telephone Encounter (Signed)
[name], MD  Toniann Fail C, LPN        Please let the patient know that his PSA is elevated compared to last PSA. We can discuss at his follow-up visit.    Pt is at appt now.
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
## 2016-09-06 ENCOUNTER — Ambulatory Visit (INDEPENDENT_AMBULATORY_CARE_PROVIDER_SITE_OTHER): Payer: PPO

## 2016-09-06 ENCOUNTER — Ambulatory Visit (INDEPENDENT_AMBULATORY_CARE_PROVIDER_SITE_OTHER): Payer: PPO | Admitting: Orthopedic Surgery

## 2016-09-06 ENCOUNTER — Encounter (INDEPENDENT_AMBULATORY_CARE_PROVIDER_SITE_OTHER): Payer: Self-pay | Admitting: Orthopedic Surgery

## 2016-09-06 DIAGNOSIS — S92111A Displaced fracture of neck of right talus, initial encounter for closed fracture: Secondary | ICD-10-CM | POA: Diagnosis not present

## 2016-09-06 DIAGNOSIS — M25571 Pain in right ankle and joints of right foot: Secondary | ICD-10-CM | POA: Diagnosis not present

## 2016-09-06 DIAGNOSIS — S92111D Displaced fracture of neck of right talus, subsequent encounter for fracture with routine healing: Secondary | ICD-10-CM

## 2016-09-06 DIAGNOSIS — I87321 Chronic venous hypertension (idiopathic) with inflammation of right lower extremity: Secondary | ICD-10-CM | POA: Insufficient documentation

## 2016-09-06 DIAGNOSIS — M79672 Pain in left foot: Secondary | ICD-10-CM | POA: Insufficient documentation

## 2016-09-06 DIAGNOSIS — M79674 Pain in right toe(s): Secondary | ICD-10-CM | POA: Diagnosis not present
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
## 2016-09-13 ENCOUNTER — Ambulatory Visit (INDEPENDENT_AMBULATORY_CARE_PROVIDER_SITE_OTHER): Payer: PPO | Admitting: Orthopedic Surgery

## 2016-09-18 DIAGNOSIS — H5203 Hypermetropia, bilateral: Secondary | ICD-10-CM | POA: Diagnosis not present

## 2016-09-18 DIAGNOSIS — H52223 Regular astigmatism, bilateral: Secondary | ICD-10-CM | POA: Diagnosis not present

## 2016-09-18 DIAGNOSIS — H524 Presbyopia: Secondary | ICD-10-CM | POA: Diagnosis not present

## 2016-09-18 DIAGNOSIS — H25013 Cortical age-related cataract, bilateral: Secondary | ICD-10-CM | POA: Diagnosis not present

## 2016-09-20 ENCOUNTER — Ambulatory Visit (INDEPENDENT_AMBULATORY_CARE_PROVIDER_SITE_OTHER): Payer: PPO | Admitting: Orthopedic Surgery

## 2016-09-20 ENCOUNTER — Encounter (INDEPENDENT_AMBULATORY_CARE_PROVIDER_SITE_OTHER): Payer: Self-pay | Admitting: Orthopedic Surgery

## 2016-09-20 DIAGNOSIS — S92111D Displaced fracture of neck of right talus, subsequent encounter for fracture with routine healing: Secondary | ICD-10-CM
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
## 2016-09-28 ENCOUNTER — Telehealth (INDEPENDENT_AMBULATORY_CARE_PROVIDER_SITE_OTHER): Payer: Self-pay | Admitting: Orthopedic Surgery

## 2016-09-28 NOTE — Telephone Encounter (Signed)
Pt will run a little behind due to physical therapy on 10/04/2016

## 2016-10-04 ENCOUNTER — Ambulatory Visit (INDEPENDENT_AMBULATORY_CARE_PROVIDER_SITE_OTHER): Payer: PPO | Admitting: Family

## 2016-10-04 DIAGNOSIS — S92111D Displaced fracture of neck of right talus, subsequent encounter for fracture with routine healing: Secondary | ICD-10-CM
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
## 2016-10-07 DIAGNOSIS — S92111A Displaced fracture of neck of right talus, initial encounter for closed fracture: Secondary | ICD-10-CM | POA: Diagnosis not present

## 2016-10-17 ENCOUNTER — Ambulatory Visit (INDEPENDENT_AMBULATORY_CARE_PROVIDER_SITE_OTHER): Payer: PPO | Admitting: Orthopedic Surgery

## 2016-10-23 ENCOUNTER — Ambulatory Visit (INDEPENDENT_AMBULATORY_CARE_PROVIDER_SITE_OTHER): Payer: PPO | Admitting: Orthopedic Surgery

## 2016-10-23 ENCOUNTER — Encounter (INDEPENDENT_AMBULATORY_CARE_PROVIDER_SITE_OTHER): Payer: Self-pay | Admitting: Orthopedic Surgery

## 2016-10-23 VITALS — Ht 70.0 in | Wt 220.0 lb

## 2016-10-23 DIAGNOSIS — S92111D Displaced fracture of neck of right talus, subsequent encounter for fracture with routine healing: Secondary | ICD-10-CM
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
## 2016-10-24 ENCOUNTER — Encounter: Payer: Self-pay | Admitting: Family Medicine

## 2016-10-24 ENCOUNTER — Ambulatory Visit (INDEPENDENT_AMBULATORY_CARE_PROVIDER_SITE_OTHER): Payer: PPO | Admitting: Family Medicine

## 2016-10-24 VITALS — BP 132/70 | HR 65 | Temp 97.9°F | Wt 219.5 lb

## 2016-10-24 DIAGNOSIS — R109 Unspecified abdominal pain: Secondary | ICD-10-CM | POA: Diagnosis not present

## 2016-10-24 DIAGNOSIS — M25512 Pain in left shoulder: Secondary | ICD-10-CM | POA: Diagnosis not present

## 2016-10-24 DIAGNOSIS — Z23 Encounter for immunization: Secondary | ICD-10-CM

## 2016-10-24 DIAGNOSIS — S025XXD Fracture of tooth (traumatic), subsequent encounter for fracture with routine healing: Secondary | ICD-10-CM

## 2016-10-24 NOTE — Assessment & Plan Note (Signed)
Upcoming dental surgery.

## 2016-10-24 NOTE — Patient Instructions (Addendum)
Flu shot today Pneumovax today. We will refer you to orthopedist.  We will check CT scan for ongoing left sided pain. See Rosaria Ferries on your way out. Schedule medicare wellness visit with Katha Cabal and physical with me (see ladies up front)
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
## 2016-10-24 NOTE — Assessment & Plan Note (Addendum)
Exam consistent with AC joint injury, possible biceps tendonitis. As not improving, pt requests referral to ortho for further evaluation. Xray machines down today so no imaging done. Encouraged ice/heating pad to shoulder in interim.
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
## 2016-10-26 ENCOUNTER — Ambulatory Visit: Payer: PPO

## 2016-10-26 ENCOUNTER — Ambulatory Visit (INDEPENDENT_AMBULATORY_CARE_PROVIDER_SITE_OTHER)
Admission: RE | Admit: 2016-10-26 | Discharge: 2016-10-26 | Disposition: A | Discharge status: Home / Self Care | Payer: PPO | Source: Ambulatory Visit | Attending: Family Medicine | Admitting: Family Medicine

## 2016-10-26 DIAGNOSIS — R109 Unspecified abdominal pain: Secondary | ICD-10-CM | POA: Diagnosis not present

## 2016-10-30 ENCOUNTER — Ambulatory Visit (INDEPENDENT_AMBULATORY_CARE_PROVIDER_SITE_OTHER): Payer: PPO | Admitting: Orthopedic Surgery

## 2016-10-31 ENCOUNTER — Ambulatory Visit (INDEPENDENT_AMBULATORY_CARE_PROVIDER_SITE_OTHER): Payer: PPO

## 2016-10-31 ENCOUNTER — Ambulatory Visit (INDEPENDENT_AMBULATORY_CARE_PROVIDER_SITE_OTHER): Payer: PPO | Admitting: Orthopedic Surgery

## 2016-10-31 DIAGNOSIS — G8929 Other chronic pain: Secondary | ICD-10-CM

## 2016-10-31 DIAGNOSIS — M25512 Pain in left shoulder: Secondary | ICD-10-CM

## 2016-10-31 DIAGNOSIS — M25552 Pain in left hip: Secondary | ICD-10-CM

## 2016-10-31 DIAGNOSIS — S92111D Displaced fracture of neck of right talus, subsequent encounter for fracture with routine healing: Secondary | ICD-10-CM | POA: Diagnosis not present
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
## 2016-11-06 DIAGNOSIS — S92111A Displaced fracture of neck of right talus, initial encounter for closed fracture: Secondary | ICD-10-CM | POA: Diagnosis not present

## 2016-11-21 ENCOUNTER — Ambulatory Visit (INDEPENDENT_AMBULATORY_CARE_PROVIDER_SITE_OTHER): Payer: PPO | Admitting: Orthopedic Surgery

## 2016-11-21 ENCOUNTER — Encounter (INDEPENDENT_AMBULATORY_CARE_PROVIDER_SITE_OTHER): Payer: Self-pay

## 2016-11-21 ENCOUNTER — Ambulatory Visit: Payer: PPO | Admitting: Family Medicine

## 2016-11-28 ENCOUNTER — Ambulatory Visit (INDEPENDENT_AMBULATORY_CARE_PROVIDER_SITE_OTHER): Payer: PPO | Admitting: Orthopedic Surgery

## 2016-12-06 ENCOUNTER — Ambulatory Visit (INDEPENDENT_AMBULATORY_CARE_PROVIDER_SITE_OTHER): Payer: PPO | Admitting: Orthopedic Surgery

## 2016-12-06 ENCOUNTER — Encounter (INDEPENDENT_AMBULATORY_CARE_PROVIDER_SITE_OTHER): Payer: Self-pay | Admitting: Orthopedic Surgery

## 2016-12-06 VITALS — Ht 70.0 in | Wt 219.0 lb

## 2016-12-06 DIAGNOSIS — S92111D Displaced fracture of neck of right talus, subsequent encounter for fracture with routine healing: Secondary | ICD-10-CM | POA: Diagnosis not present

## 2016-12-06 DIAGNOSIS — M7542 Impingement syndrome of left shoulder: Secondary | ICD-10-CM | POA: Diagnosis not present

## 2016-12-06 MED ORDER — LIDOCAINE HCL 1 % IJ SOLN
5.0000 mL | INTRAMUSCULAR | Status: AC | PRN
  Administered 2016-12-06: 5 mL

## 2016-12-06 MED ORDER — METHYLPREDNISOLONE ACETATE 40 MG/ML IJ SUSP
40.0000 mg | INTRAMUSCULAR | Status: AC | PRN
  Administered 2016-12-06: 40 mg via INTRA_ARTICULAR
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
## 2016-12-07 DIAGNOSIS — S92111A Displaced fracture of neck of right talus, initial encounter for closed fracture: Secondary | ICD-10-CM | POA: Diagnosis not present

## 2017-01-06 DIAGNOSIS — S92111A Displaced fracture of neck of right talus, initial encounter for closed fracture: Secondary | ICD-10-CM | POA: Diagnosis not present

## 2017-01-07 ENCOUNTER — Ambulatory Visit (INDEPENDENT_AMBULATORY_CARE_PROVIDER_SITE_OTHER): Payer: PPO | Admitting: Orthopedic Surgery

## 2017-01-07 ENCOUNTER — Encounter (INDEPENDENT_AMBULATORY_CARE_PROVIDER_SITE_OTHER): Payer: Self-pay | Admitting: Orthopedic Surgery

## 2017-01-07 DIAGNOSIS — M25512 Pain in left shoulder: Secondary | ICD-10-CM

## 2017-01-07 DIAGNOSIS — S92111D Displaced fracture of neck of right talus, subsequent encounter for fracture with routine healing: Secondary | ICD-10-CM
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
## 2017-01-25 ENCOUNTER — Ambulatory Visit
Admission: RE | Admit: 2017-01-25 | Discharge: 2017-01-25 | Disposition: A | Discharge status: Home / Self Care | Payer: PPO | Source: Ambulatory Visit | Attending: Orthopedic Surgery | Admitting: Orthopedic Surgery

## 2017-01-25 DIAGNOSIS — R600 Localized edema: Secondary | ICD-10-CM | POA: Diagnosis not present

## 2017-01-25 DIAGNOSIS — M25512 Pain in left shoulder: Secondary | ICD-10-CM

## 2017-02-06 DIAGNOSIS — S92111A Displaced fracture of neck of right talus, initial encounter for closed fracture: Secondary | ICD-10-CM | POA: Diagnosis not present

## 2017-02-12 ENCOUNTER — Encounter (INDEPENDENT_AMBULATORY_CARE_PROVIDER_SITE_OTHER): Payer: Self-pay | Admitting: Orthopedic Surgery

## 2017-02-12 ENCOUNTER — Ambulatory Visit (INDEPENDENT_AMBULATORY_CARE_PROVIDER_SITE_OTHER): Payer: PPO | Admitting: Orthopedic Surgery

## 2017-02-12 DIAGNOSIS — S92111D Displaced fracture of neck of right talus, subsequent encounter for fracture with routine healing: Secondary | ICD-10-CM | POA: Diagnosis not present

## 2017-02-12 DIAGNOSIS — M7542 Impingement syndrome of left shoulder: Secondary | ICD-10-CM | POA: Diagnosis not present
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
## 2017-02-25 ENCOUNTER — Telehealth: Payer: Self-pay | Admitting: Family Medicine

## 2017-02-25 NOTE — Telephone Encounter (Signed)
Filled out and in Lisa's box.  

## 2017-02-25 NOTE — Telephone Encounter (Signed)
Placed form in Dr. G's box.  

## 2017-02-25 NOTE — Telephone Encounter (Signed)
Pt came in requesting extension for his handicapped placecard that expires tomorrow. Form placed in Rx tower.

## 2017-02-26 NOTE — Telephone Encounter (Signed)
Left message on vm per dpr notifying pt disability parking form is ready to pick up.   [Placed form at front desk. Made copy to be scanned.]

## 2017-03-04 DIAGNOSIS — C61 Malignant neoplasm of prostate: Secondary | ICD-10-CM | POA: Diagnosis not present

## 2017-03-06 ENCOUNTER — Ambulatory Visit: Payer: Self-pay | Admitting: Family Medicine

## 2017-03-07 DIAGNOSIS — S92111A Displaced fracture of neck of right talus, initial encounter for closed fracture: Secondary | ICD-10-CM | POA: Diagnosis not present

## 2017-03-21 ENCOUNTER — Telehealth (INDEPENDENT_AMBULATORY_CARE_PROVIDER_SITE_OTHER): Payer: Self-pay | Admitting: Orthopedic Surgery

## 2017-03-21 NOTE — Telephone Encounter (Signed)
Patient called to request extension on PT.  Please call patient to advise.  PT is @ [facility] in [place]

## 2017-03-25 ENCOUNTER — Other Ambulatory Visit (INDEPENDENT_AMBULATORY_CARE_PROVIDER_SITE_OTHER): Payer: Self-pay

## 2017-03-28 ENCOUNTER — Other Ambulatory Visit (INDEPENDENT_AMBULATORY_CARE_PROVIDER_SITE_OTHER): Payer: Self-pay

## 2017-03-28 NOTE — Telephone Encounter (Signed)
Order faxed to [phone] ok to continue with physical therapy.

## 2017-04-01 ENCOUNTER — Other Ambulatory Visit: Payer: Self-pay | Admitting: Family Medicine

## 2017-04-01 DIAGNOSIS — C61 Malignant neoplasm of prostate: Secondary | ICD-10-CM | POA: Diagnosis not present

## 2017-04-01 DIAGNOSIS — N401 Enlarged prostate with lower urinary tract symptoms: Secondary | ICD-10-CM

## 2017-04-01 DIAGNOSIS — N138 Other obstructive and reflux uropathy: Secondary | ICD-10-CM

## 2017-04-01 DIAGNOSIS — E785 Hyperlipidemia, unspecified: Secondary | ICD-10-CM

## 2017-04-01 DIAGNOSIS — R7303 Prediabetes: Secondary | ICD-10-CM

## 2017-04-02 ENCOUNTER — Ambulatory Visit: Payer: PPO

## 2017-04-02 ENCOUNTER — Ambulatory Visit (INDEPENDENT_AMBULATORY_CARE_PROVIDER_SITE_OTHER): Payer: PPO

## 2017-04-02 VITALS — BP 118/74 | HR 63 | Temp 98.4°F | Ht 69.5 in | Wt 225.0 lb

## 2017-04-02 DIAGNOSIS — Z0000 Encounter for general adult medical examination without abnormal findings: Secondary | ICD-10-CM

## 2017-04-02 DIAGNOSIS — N401 Enlarged prostate with lower urinary tract symptoms: Secondary | ICD-10-CM

## 2017-04-02 DIAGNOSIS — R7303 Prediabetes: Secondary | ICD-10-CM

## 2017-04-02 DIAGNOSIS — N138 Other obstructive and reflux uropathy: Secondary | ICD-10-CM

## 2017-04-02 DIAGNOSIS — E785 Hyperlipidemia, unspecified: Secondary | ICD-10-CM | POA: Diagnosis not present

## 2017-04-02 LAB — LIPID PANEL
CHOLESTEROL: 209 mg/dL — AB (ref 0–200)
HDL: 35.8 mg/dL — ABNORMAL LOW (ref >39.00)
LDL CALC: 154 mg/dL — AB (ref 0–99)
NonHDL: 173.02
TRIGLYCERIDES: 97 mg/dL (ref 0.0–149.0)
Total CHOL/HDL Ratio: 6
VLDL: 19.4 mg/dL (ref 0.0–40.0)

## 2017-04-02 LAB — COMPREHENSIVE METABOLIC PANEL
ALBUMIN: 4.3 g/dL (ref 3.5–5.2)
ALK PHOS: 59 U/L (ref 39–117)
ALT: 27 U/L (ref 0–53)
AST: 26 U/L (ref 0–37)
BUN: 24 mg/dL — ABNORMAL HIGH (ref 6–23)
CALCIUM: 9.3 mg/dL (ref 8.4–10.5)
CHLORIDE: 104 meq/L (ref 96–112)
CO2: 29 mEq/L (ref 19–32)
CREATININE: 0.96 mg/dL (ref 0.40–1.50)
GFR: 100.48 mL/min (ref >60.00)
Glucose, Bld: 108 mg/dL — ABNORMAL HIGH (ref 70–99)
Potassium: 4.3 mEq/L (ref 3.5–5.1)
Sodium: 139 mEq/L (ref 135–145)
TOTAL PROTEIN: 7.7 g/dL (ref 6.0–8.3)
Total Bilirubin: 0.4 mg/dL (ref 0.2–1.2)

## 2017-04-02 LAB — HEMOGLOBIN A1C: Hgb A1c MFr Bld: 6 % (ref 4.6–6.5)

## 2017-04-02 NOTE — Patient Instructions (Signed)
Juan Hunt , Thank you for taking time to come for your Medicare Wellness Visit. I appreciate your ongoing commitment to your health goals. Please review the following plan we discussed and let me know if I can assist you in the future.   These are the goals we discussed: Goals    . Increase physical activity     Starting 04/02/2017, I will continue to participate in physical therapy for 60 minutes once weekly and as directed by orthopedic surgeon.        This is a list of the screening recommended for you and due dates:  Health Maintenance  Topic Date Due  . Pneumonia vaccines (2 of 2 - PCV13) 10/24/2017  . Colon Cancer Screening  04/24/2018  . DTaP/Tdap/Td vaccine (2 - Td) 02/20/2024  . Tetanus Vaccine  02/20/2024  . Flu Shot  Completed  .  Hepatitis C: One time screening is recommended by Center for Disease Control  (CDC) for  adults born from [birth year] through 1965.   Completed   Preventive Care for Adults  A healthy lifestyle and preventive care can promote health and wellness. Preventive health guidelines for adults include the following key practices.  . A routine yearly physical is a good way to check with your health care provider about your health and preventive screening. It is a chance to share any concerns and updates on your health and to receive a thorough exam.  . Visit your dentist for a routine exam and preventive care every 6 months. Brush your teeth twice a day and floss once a day. Good oral hygiene prevents tooth decay and gum disease.  . The frequency of eye exams is based on your age, health, family medical history, use  of contact lenses, and other factors. Follow your health care provider's recommendations for frequency of eye exams.  . Eat a healthy diet. Foods like vegetables, fruits, whole grains, low-fat dairy products, and lean protein foods contain the nutrients you need without too many calories. Decrease your intake of foods high in solid fats, added  sugars, and salt. Eat the right amount of calories for you. Get information about a proper diet from your health care provider, if necessary.  . Regular physical exercise is one of the most important things you can do for your health. Most adults should get at least 150 minutes of moderate-intensity exercise (any activity that increases your heart rate and causes you to sweat) each week. In addition, most adults need muscle-strengthening exercises on 2 or more days a week.  Silver Sneakers may be a benefit available to you. To determine eligibility, you may visit the website: www.silversneakers.com or contact program at 6197965833 Mon-Fri between 8AM-8PM.   . Maintain a healthy weight. The body mass index (BMI) is a screening tool to identify possible weight problems. It provides an estimate of body fat based on height and weight. Your health care provider can find your BMI and can help you achieve or maintain a healthy weight.   For adults 20 years and older: ? A BMI below 18.5 is considered underweight. ? A BMI of 18.5 to 24.9 is normal. ? A BMI of 25 to 29.9 is considered overweight. ? A BMI of 30 and above is considered obese.   . Maintain normal blood lipids and cholesterol levels by exercising and minimizing your intake of saturated fat. Eat a balanced diet with plenty of fruit and vegetables. Blood tests for lipids and cholesterol should begin at age 43 and  be repeated every 5 years. If your lipid or cholesterol levels are high, you are over 50, or you are at high risk for heart disease, you may need your cholesterol levels checked more frequently. Ongoing high lipid and cholesterol levels should be treated with medicines if diet and exercise are not working.  . If you smoke, find out from your health care provider how to quit. If you do not use tobacco, please do not start.  . If you choose to drink alcohol, please do not consume more than 2 drinks per day. One drink is considered to  be 12 ounces (355 mL) of beer, 5 ounces (148 mL) of wine, or 1.5 ounces (44 mL) of liquor.  . If you are 67-2 years old, ask your health care provider if you should take aspirin to prevent strokes.  . Use sunscreen. Apply sunscreen liberally and repeatedly throughout the day. You should seek shade when your shadow is shorter than you. Protect yourself by wearing long sleeves, pants, a wide-brimmed hat, and sunglasses year round, whenever you are outdoors.  . Once a month, do a whole body skin exam, using a mirror to look at the skin on your back. Tell your health care provider of new moles, moles that have irregular borders, moles that are larger than a pencil eraser, or moles that have changed in shape or color.
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
## 2017-04-06 DIAGNOSIS — S92111A Displaced fracture of neck of right talus, initial encounter for closed fracture: Secondary | ICD-10-CM | POA: Diagnosis not present

## 2017-04-08 DIAGNOSIS — C61 Malignant neoplasm of prostate: Secondary | ICD-10-CM | POA: Diagnosis not present

## 2017-04-09 ENCOUNTER — Encounter: Payer: Self-pay | Admitting: Family Medicine

## 2017-04-09 ENCOUNTER — Ambulatory Visit (INDEPENDENT_AMBULATORY_CARE_PROVIDER_SITE_OTHER): Payer: PPO | Admitting: Family Medicine

## 2017-04-09 VITALS — BP 134/70 | HR 65 | Temp 98.2°F | Ht 70.0 in | Wt 224.0 lb

## 2017-04-09 DIAGNOSIS — C61 Malignant neoplasm of prostate: Secondary | ICD-10-CM

## 2017-04-09 DIAGNOSIS — Z0000 Encounter for general adult medical examination without abnormal findings: Secondary | ICD-10-CM | POA: Diagnosis not present

## 2017-04-09 DIAGNOSIS — E669 Obesity, unspecified: Secondary | ICD-10-CM | POA: Diagnosis not present

## 2017-04-09 DIAGNOSIS — Z7189 Other specified counseling: Secondary | ICD-10-CM | POA: Diagnosis not present

## 2017-04-09 DIAGNOSIS — E785 Hyperlipidemia, unspecified: Secondary | ICD-10-CM | POA: Diagnosis not present

## 2017-04-09 DIAGNOSIS — R7303 Prediabetes: Secondary | ICD-10-CM | POA: Diagnosis not present

## 2017-04-09 NOTE — Assessment & Plan Note (Addendum)
Chronic, stable. Encouraged avoiding added sugars in diet and working towards weight loss.

## 2017-04-09 NOTE — Patient Instructions (Addendum)
If interested, check with pharmacy about new 2 shot shingles series (shingrix).  Work on Scientist, physiological.  You are doing well today.  Return as needed or in 1 year for next physical.   Health Maintenance, Male A healthy lifestyle and preventive care is important for your health and wellness. Ask your health care provider about what schedule of regular examinations is right for you. What should I know about weight and diet? Eat a Healthy Diet  Eat plenty of vegetables, fruits, whole grains, low-fat dairy products, and lean protein.  Do not eat a lot of foods high in solid fats, added sugars, or salt.  Maintain a Healthy Weight Regular exercise can help you achieve or maintain a healthy weight. You should:  Do at least 150 minutes of exercise each week. The exercise should increase your heart rate and make you sweat (moderate-intensity exercise).  Do strength-training exercises at least twice a week.  Watch Your Levels of Cholesterol and Blood Lipids  Have your blood tested for lipids and cholesterol every 5 years starting at 67 years of age. If you are at high risk for heart disease, you should start having your blood tested when you are 67 years old. You may need to have your cholesterol levels checked more often if: ? Your lipid or cholesterol levels are high. ? You are older than 67 years of age. ? You are at high risk for heart disease.  What should I know about cancer screening? Many types of cancers can be detected early and may often be prevented. Lung Cancer  You should be screened every year for lung cancer if: ? You are a current smoker who has smoked for at least 30 years. ? You are a former smoker who has quit within the past 15 years.  Talk to your health care provider about your screening options, when you should start screening, and how often you should be screened.  Colorectal Cancer  Routine colorectal cancer screening usually begins at 67 years of age and  should be repeated every 5-10 years until you are 67 years old. You may need to be screened more often if early forms of precancerous polyps or small growths are found. Your health care provider may recommend screening at an earlier age if you have risk factors for colon cancer.  Your health care provider may recommend using home test kits to check for hidden blood in the stool.  A small camera at the end of a tube can be used to examine your colon (sigmoidoscopy or colonoscopy). This checks for the earliest forms of colorectal cancer.  Prostate and Testicular Cancer  Depending on your age and overall health, your health care provider may do certain tests to screen for prostate and testicular cancer.  Talk to your health care provider about any symptoms or concerns you have about testicular or prostate cancer.  Skin Cancer  Check your skin from head to toe regularly.  Tell your health care provider about any new moles or changes in moles, especially if: ? There is a change in a mole's size, shape, or color. ? You have a mole that is larger than a pencil eraser.  Always use sunscreen. Apply sunscreen liberally and repeat throughout the day.  Protect yourself by wearing long sleeves, pants, a wide-brimmed hat, and sunglasses when outside.  What should I know about heart disease, diabetes, and high blood pressure?  If you are 5-62 years of age, have your blood pressure checked every 3-5  years. If you are 92 years of age or older, have your blood pressure checked every year. You should have your blood pressure measured twice-once when you are at a hospital or clinic, and once when you are not at a hospital or clinic. Record the average of the two measurements. To check your blood pressure when you are not at a hospital or clinic, you can use: ? An automated blood pressure machine at a pharmacy. ? A home blood pressure monitor.  Talk to your health care provider about your target blood  pressure.  If you are between 67-68 years old, ask your health care provider if you should take aspirin to prevent heart disease.  Have regular diabetes screenings by checking your fasting blood sugar level. ? If you are at a normal weight and have a low risk for diabetes, have this test once every three years after the age of 66. ? If you are overweight and have a high risk for diabetes, consider being tested at a younger age or more often.  A one-time screening for abdominal aortic aneurysm (AAA) by ultrasound is recommended for men aged 69-75 years who are current or former smokers. What should I know about preventing infection? Hepatitis B If you have a higher risk for hepatitis B, you should be screened for this virus. Talk with your health care provider to find out if you are at risk for hepatitis B infection. Hepatitis C Blood testing is recommended for:  Everyone born from [birth year] through 1965.  Anyone with known risk factors for hepatitis C.  Sexually Transmitted Diseases (STDs)  You should be screened each year for STDs including gonorrhea and chlamydia if: ? You are sexually active and are younger than 67 years of age. ? You are older than 67 years of age and your health care provider tells you that you are at risk for this type of infection. ? Your sexual activity has changed since you were last screened and you are at an increased risk for chlamydia or gonorrhea. Ask your health care provider if you are at risk.  Talk with your health care provider about whether you are at high risk of being infected with HIV. Your health care provider may recommend a prescription medicine to help prevent HIV infection.  What else can I do?  Schedule regular health, dental, and eye exams.  Stay current with your vaccines (immunizations).  Do not use any tobacco products, such as cigarettes, chewing tobacco, and e-cigarettes. If you need help quitting, ask your health care  provider.  Limit alcohol intake to no more than 2 drinks per day. One drink equals 12 ounces of beer, 5 ounces of wine, or 1 ounces of hard liquor.  Do not use street drugs.  Do not share needles.  Ask your health care provider for help if you need support or information about quitting drugs.  Tell your health care provider if you often feel depressed.  Tell your health care provider if you have ever been abused or do not feel safe at home. This information is not intended to replace advice given to you by your health care provider. Make sure you discuss any questions you have with your health care provider. Document Released: 06/23/2007 Document Revised: 08/24/2015 Document Reviewed: 09/28/2014 Elsevier Interactive Patient Education  [employer].
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
## 2017-04-09 NOTE — Progress Notes (Signed)
I reviewed health advisor's note, was available for consultation, and agree with documentation and plan.  

## 2017-04-09 NOTE — Assessment & Plan Note (Addendum)
Chronic, stable. Continue lipitor 40mg  and fish oil daily. The 10-year ASCVD risk score Mikey Bussing DC Brooke Bonito., et al., 2013) is: 12.4%   Values used to calculate the score:     Age: 67 years     Sex: Male     Is Non-Hispanic African American: Yes     Diabetic: No     Tobacco smoker: No     Systolic Blood Pressure: 557 mmHg     Is BP treated: No     HDL Cholesterol: 35.8 mg/dL     Total Cholesterol: 209 mg/dL

## 2017-04-09 NOTE — Assessment & Plan Note (Signed)
Preventative protocols reviewed and updated unless pt declined. Discussed healthy diet and lifestyle.  

## 2017-04-09 NOTE — Assessment & Plan Note (Signed)
Low risk cancer. Followed by uro. Appreciate their care.

## 2017-04-09 NOTE — Assessment & Plan Note (Signed)
Discussed healthy diet and lifestyle changes to affect sustainable weight loss. Encouraged continued participation in silver sneakers, discussed stationary bicycle use.

## 2017-04-09 NOTE — Assessment & Plan Note (Signed)
Advanced directive discussion - has not set up. Has packet at home. Would want daughter [name] to be HCPOA.

## 2017-04-15 ENCOUNTER — Encounter (INDEPENDENT_AMBULATORY_CARE_PROVIDER_SITE_OTHER): Payer: Self-pay | Admitting: Orthopedic Surgery

## 2017-04-15 ENCOUNTER — Ambulatory Visit (INDEPENDENT_AMBULATORY_CARE_PROVIDER_SITE_OTHER): Payer: PPO | Admitting: Orthopedic Surgery

## 2017-04-15 DIAGNOSIS — M7542 Impingement syndrome of left shoulder: Secondary | ICD-10-CM | POA: Diagnosis not present

## 2017-04-15 DIAGNOSIS — M6701 Short Achilles tendon (acquired), right ankle: Secondary | ICD-10-CM | POA: Diagnosis not present

## 2017-04-15 DIAGNOSIS — S92111D Displaced fracture of neck of right talus, subsequent encounter for fracture with routine healing: Secondary | ICD-10-CM | POA: Diagnosis not present

## 2017-04-15 MED ORDER — METHYLPREDNISOLONE ACETATE 40 MG/ML IJ SUSP
40.0000 mg | INTRAMUSCULAR | Status: AC | PRN
  Administered 2017-04-15: 40 mg via INTRA_ARTICULAR

## 2017-04-15 MED ORDER — LIDOCAINE HCL 1 % IJ SOLN
5.0000 mL | INTRAMUSCULAR | Status: AC | PRN
  Administered 2017-04-15: 5 mL
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
## 2017-05-07 DIAGNOSIS — S92111A Displaced fracture of neck of right talus, initial encounter for closed fracture: Secondary | ICD-10-CM | POA: Diagnosis not present
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
## 2017-06-06 DIAGNOSIS — S92111A Displaced fracture of neck of right talus, initial encounter for closed fracture: Secondary | ICD-10-CM | POA: Diagnosis not present

## 2017-06-17 ENCOUNTER — Ambulatory Visit (INDEPENDENT_AMBULATORY_CARE_PROVIDER_SITE_OTHER): Payer: PPO | Admitting: Orthopedic Surgery

## 2017-06-17 ENCOUNTER — Encounter (INDEPENDENT_AMBULATORY_CARE_PROVIDER_SITE_OTHER): Payer: Self-pay | Admitting: Orthopedic Surgery

## 2017-06-17 DIAGNOSIS — S92111D Displaced fracture of neck of right talus, subsequent encounter for fracture with routine healing: Secondary | ICD-10-CM | POA: Diagnosis not present

## 2017-06-17 DIAGNOSIS — M7542 Impingement syndrome of left shoulder: Secondary | ICD-10-CM | POA: Diagnosis not present

## 2017-06-17 MED ORDER — LIDOCAINE HCL 1 % IJ SOLN
5.0000 mL | INTRAMUSCULAR | Status: AC | PRN
  Administered 2017-06-17: 5 mL

## 2017-06-17 MED ORDER — METHYLPREDNISOLONE ACETATE 40 MG/ML IJ SUSP
40.0000 mg | INTRAMUSCULAR | Status: AC | PRN
  Administered 2017-06-17: 40 mg via INTRA_ARTICULAR
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
## 2017-07-07 DIAGNOSIS — S92111A Displaced fracture of neck of right talus, initial encounter for closed fracture: Secondary | ICD-10-CM | POA: Diagnosis not present

## 2017-07-24 ENCOUNTER — Telehealth: Payer: Self-pay | Admitting: Family Medicine

## 2017-07-24 NOTE — Telephone Encounter (Signed)
Sildenafil Last rx:  07/20/16 Last OV (CPE):  04/09/17 Next OV (CPE):  04/11/18  Is it ok to refill med?

## 2017-07-24 NOTE — Telephone Encounter (Signed)
Pt is requesting a refill on sildenafil

## 2017-07-25 MED ORDER — SILDENAFIL CITRATE 20 MG PO TABS: 40.0000 mg | ORAL_TABLET | Freq: Every day | ORAL | 3 refills | Status: DC | PRN

## 2017-08-06 DIAGNOSIS — S92111A Displaced fracture of neck of right talus, initial encounter for closed fracture: Secondary | ICD-10-CM | POA: Diagnosis not present

## 2017-08-19 ENCOUNTER — Ambulatory Visit (INDEPENDENT_AMBULATORY_CARE_PROVIDER_SITE_OTHER): Payer: PPO | Admitting: Orthopedic Surgery

## 2017-08-19 ENCOUNTER — Encounter (INDEPENDENT_AMBULATORY_CARE_PROVIDER_SITE_OTHER): Payer: Self-pay | Admitting: Orthopedic Surgery

## 2017-08-19 VITALS — Ht 70.0 in | Wt 220.0 lb

## 2017-08-19 DIAGNOSIS — M7542 Impingement syndrome of left shoulder: Secondary | ICD-10-CM

## 2017-08-19 DIAGNOSIS — M6701 Short Achilles tendon (acquired), right ankle: Secondary | ICD-10-CM | POA: Diagnosis not present

## 2017-08-19 DIAGNOSIS — S92111D Displaced fracture of neck of right talus, subsequent encounter for fracture with routine healing: Secondary | ICD-10-CM | POA: Diagnosis not present

## 2017-08-26 ENCOUNTER — Encounter (INDEPENDENT_AMBULATORY_CARE_PROVIDER_SITE_OTHER): Payer: Self-pay | Admitting: Orthopedic Surgery
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
## 2017-08-30 ENCOUNTER — Telehealth (INDEPENDENT_AMBULATORY_CARE_PROVIDER_SITE_OTHER): Payer: Self-pay | Admitting: Orthopedic Surgery

## 2017-08-30 NOTE — Telephone Encounter (Signed)
[facility] called checking status request for records. IC,lmvm advising that the records and bills were mailed 8/19

## 2017-10-15 ENCOUNTER — Ambulatory Visit (INDEPENDENT_AMBULATORY_CARE_PROVIDER_SITE_OTHER): Payer: PPO | Admitting: Orthopedic Surgery

## 2017-10-15 ENCOUNTER — Encounter (INDEPENDENT_AMBULATORY_CARE_PROVIDER_SITE_OTHER): Payer: Self-pay | Admitting: Orthopedic Surgery

## 2017-10-15 ENCOUNTER — Ambulatory Visit (INDEPENDENT_AMBULATORY_CARE_PROVIDER_SITE_OTHER): Payer: PPO

## 2017-10-15 VITALS — Ht 70.0 in | Wt 220.0 lb

## 2017-10-15 DIAGNOSIS — C61 Malignant neoplasm of prostate: Secondary | ICD-10-CM | POA: Diagnosis not present

## 2017-10-15 DIAGNOSIS — M25571 Pain in right ankle and joints of right foot: Secondary | ICD-10-CM

## 2017-10-15 MED ORDER — LIDOCAINE HCL 1 % IJ SOLN
1.0000 mL | INTRAMUSCULAR | Status: AC | PRN
  Administered 2017-10-15: 1 mL

## 2017-10-15 MED ORDER — METHYLPREDNISOLONE ACETATE 80 MG/ML IJ SUSP
80.0000 mg | INTRAMUSCULAR | Status: AC | PRN
  Administered 2017-10-15: 80 mg via INTRA_ARTICULAR
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
## 2017-10-22 DIAGNOSIS — N5201 Erectile dysfunction due to arterial insufficiency: Secondary | ICD-10-CM | POA: Diagnosis not present

## 2017-10-22 DIAGNOSIS — C61 Malignant neoplasm of prostate: Secondary | ICD-10-CM | POA: Diagnosis not present

## 2017-10-24 ENCOUNTER — Ambulatory Visit (INDEPENDENT_AMBULATORY_CARE_PROVIDER_SITE_OTHER): Payer: PPO

## 2017-10-24 DIAGNOSIS — Z23 Encounter for immunization: Secondary | ICD-10-CM | POA: Diagnosis not present

## 2017-11-05 ENCOUNTER — Ambulatory Visit (INDEPENDENT_AMBULATORY_CARE_PROVIDER_SITE_OTHER): Payer: PPO | Admitting: Orthopedic Surgery

## 2017-11-07 ENCOUNTER — Ambulatory Visit (INDEPENDENT_AMBULATORY_CARE_PROVIDER_SITE_OTHER): Payer: PPO | Admitting: Orthopedic Surgery

## 2017-11-11 ENCOUNTER — Ambulatory Visit (INDEPENDENT_AMBULATORY_CARE_PROVIDER_SITE_OTHER): Payer: PPO | Admitting: Orthopedic Surgery

## 2017-11-11 ENCOUNTER — Encounter (INDEPENDENT_AMBULATORY_CARE_PROVIDER_SITE_OTHER): Payer: Self-pay | Admitting: Orthopedic Surgery

## 2017-11-11 VITALS — Ht 70.0 in | Wt 220.0 lb

## 2017-11-11 DIAGNOSIS — M6701 Short Achilles tendon (acquired), right ankle: Secondary | ICD-10-CM

## 2017-11-11 DIAGNOSIS — M25571 Pain in right ankle and joints of right foot: Secondary | ICD-10-CM
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
## 2017-12-17 ENCOUNTER — Encounter: Payer: Self-pay | Admitting: Family Medicine

## 2017-12-17 ENCOUNTER — Ambulatory Visit (INDEPENDENT_AMBULATORY_CARE_PROVIDER_SITE_OTHER): Payer: PPO | Admitting: Family Medicine

## 2017-12-17 VITALS — BP 136/78 | HR 61 | Temp 98.2°F | Ht 70.0 in | Wt 224.5 lb

## 2017-12-17 DIAGNOSIS — M25571 Pain in right ankle and joints of right foot: Secondary | ICD-10-CM

## 2017-12-17 DIAGNOSIS — G8929 Other chronic pain: Secondary | ICD-10-CM

## 2017-12-17 DIAGNOSIS — S92111D Displaced fracture of neck of right talus, subsequent encounter for fracture with routine healing: Secondary | ICD-10-CM

## 2017-12-17 MED ORDER — MELOXICAM 15 MG PO TABS: 15.0000 mg | ORAL_TABLET | Freq: Every day | ORAL | 1 refills | Status: DC

## 2017-12-17 NOTE — Assessment & Plan Note (Signed)
Ongoing, after trauma and ankle ORIF with hardware in place (plate and screws). Discussed pros/cons of meloxicam including but not limited to effect on kidney, GI, and CV risk - ok to continue taking PRN (3-4 times a week). Space out from aspirin use. Pt agrees with plan.

## 2017-12-17 NOTE — Patient Instructions (Signed)
Look into ankle sleeve brace or compression stockings We will refill meloxicam 15mg  daily - take 1/2- 1 once daily as needed.  Don't mix with other anti inflammatories, tylenol is ok.

## 2018-02-17 ENCOUNTER — Telehealth (INDEPENDENT_AMBULATORY_CARE_PROVIDER_SITE_OTHER): Payer: Self-pay | Admitting: Orthopedic Surgery

## 2018-02-17 NOTE — Telephone Encounter (Signed)
Received and will hold for Dr. Sharol Given to address upon return to the office.

## 2018-02-17 NOTE — Telephone Encounter (Signed)
[place]  [phone]  Office phone number on Fax   Juan Hunt called wanted to check and see if we received a Fax from her on the 27th of January. [name] faxed over the Medical release form and life care form needed for future care

## 2018-02-19 ENCOUNTER — Ambulatory Visit (INDEPENDENT_AMBULATORY_CARE_PROVIDER_SITE_OTHER): Payer: PPO | Admitting: Orthopedic Surgery

## 2018-02-19 ENCOUNTER — Ambulatory Visit (INDEPENDENT_AMBULATORY_CARE_PROVIDER_SITE_OTHER): Payer: PPO | Admitting: Family

## 2018-02-20 ENCOUNTER — Encounter: Payer: Self-pay | Admitting: *Deleted

## 2018-02-25 ENCOUNTER — Ambulatory Visit (INDEPENDENT_AMBULATORY_CARE_PROVIDER_SITE_OTHER): Payer: PPO | Admitting: Orthopedic Surgery

## 2018-02-25 ENCOUNTER — Encounter (INDEPENDENT_AMBULATORY_CARE_PROVIDER_SITE_OTHER): Payer: Self-pay | Admitting: Orthopedic Surgery

## 2018-02-25 VITALS — Ht 70.0 in | Wt 224.0 lb

## 2018-02-25 DIAGNOSIS — M25571 Pain in right ankle and joints of right foot: Secondary | ICD-10-CM

## 2018-03-03 ENCOUNTER — Encounter (INDEPENDENT_AMBULATORY_CARE_PROVIDER_SITE_OTHER): Payer: Self-pay | Admitting: Orthopedic Surgery

## 2018-03-03 DIAGNOSIS — M25571 Pain in right ankle and joints of right foot: Secondary | ICD-10-CM | POA: Diagnosis not present

## 2018-03-03 MED ORDER — METHYLPREDNISOLONE ACETATE 80 MG/ML IJ SUSP
80.0000 mg | INTRAMUSCULAR | Status: AC | PRN
  Administered 2018-03-03: 80 mg via INTRA_ARTICULAR

## 2018-03-03 MED ORDER — LIDOCAINE HCL 1 % IJ SOLN
1.0000 mL | INTRAMUSCULAR | Status: AC | PRN
  Administered 2018-03-03: 1 mL
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
## 2018-03-05 NOTE — Telephone Encounter (Signed)
done

## 2018-03-13 ENCOUNTER — Telehealth: Payer: Self-pay | Admitting: Family Medicine

## 2018-03-13 NOTE — Telephone Encounter (Signed)
Left message asking pt to call office please r/s 4/3 appointment with dr g

## 2018-03-25 ENCOUNTER — Encounter: Payer: Self-pay | Admitting: Internal Medicine

## 2018-04-04 ENCOUNTER — Ambulatory Visit: Payer: PPO

## 2018-04-11 ENCOUNTER — Encounter: Payer: PPO | Admitting: Family Medicine

## 2018-04-14 DIAGNOSIS — C61 Malignant neoplasm of prostate: Secondary | ICD-10-CM | POA: Diagnosis not present

## 2018-04-21 DIAGNOSIS — T82898A Other specified complication of vascular prosthetic devices, implants and grafts, initial encounter: Secondary | ICD-10-CM | POA: Diagnosis not present

## 2018-04-21 DIAGNOSIS — R972 Elevated prostate specific antigen [PSA]: Secondary | ICD-10-CM | POA: Diagnosis not present

## 2018-04-21 DIAGNOSIS — Z6832 Body mass index (BMI) 32.0-32.9, adult: Secondary | ICD-10-CM | POA: Diagnosis not present

## 2018-04-21 DIAGNOSIS — E1140 Type 2 diabetes mellitus with diabetic neuropathy, unspecified: Secondary | ICD-10-CM | POA: Diagnosis not present

## 2018-04-21 DIAGNOSIS — G5601 Carpal tunnel syndrome, right upper limb: Secondary | ICD-10-CM | POA: Diagnosis not present

## 2018-04-21 DIAGNOSIS — K219 Gastro-esophageal reflux disease without esophagitis: Secondary | ICD-10-CM | POA: Diagnosis not present

## 2018-04-21 DIAGNOSIS — R569 Unspecified convulsions: Secondary | ICD-10-CM | POA: Diagnosis not present

## 2018-04-21 DIAGNOSIS — I272 Pulmonary hypertension, unspecified: Secondary | ICD-10-CM | POA: Diagnosis not present

## 2018-04-21 DIAGNOSIS — M1990 Unspecified osteoarthritis, unspecified site: Secondary | ICD-10-CM | POA: Diagnosis not present

## 2018-04-21 DIAGNOSIS — E1122 Type 2 diabetes mellitus with diabetic chronic kidney disease: Secondary | ICD-10-CM | POA: Diagnosis not present

## 2018-04-21 DIAGNOSIS — I132 Hypertensive heart and chronic kidney disease with heart failure and with stage 5 chronic kidney disease, or end stage renal disease: Secondary | ICD-10-CM | POA: Diagnosis not present

## 2018-04-21 DIAGNOSIS — E669 Obesity, unspecified: Secondary | ICD-10-CM | POA: Diagnosis not present

## 2018-04-21 DIAGNOSIS — N186 End stage renal disease: Secondary | ICD-10-CM | POA: Diagnosis not present

## 2018-04-21 DIAGNOSIS — I509 Heart failure, unspecified: Secondary | ICD-10-CM | POA: Diagnosis not present

## 2018-04-22 ENCOUNTER — Encounter: Payer: PPO | Admitting: Family Medicine

## 2018-04-28 DIAGNOSIS — C61 Malignant neoplasm of prostate: Secondary | ICD-10-CM | POA: Diagnosis not present

## 2018-05-01 ENCOUNTER — Telehealth: Payer: Self-pay | Admitting: Medical Oncology

## 2018-05-01 NOTE — Telephone Encounter (Signed)
I called pt to introduce myself as the Prostate Nurse Navigator and the Coordinator of the Prostate [place].  1. I confirmed with the patient he is aware of his referral to the clinic.  He was scheduled for 4/28 clinic but appointment has been rescheduled to 5/8 due to the availability of his pathology slides. I discussed the importance of having all 3 biopsies to compare.   2. I discussed the format of the clinic and the physicians he will be seeing that day.  3. I discussed the clinic is being held by WebEx due to COVID-19 to limit exposure. He is familiar with this type visit and had his follow up with Dr.Herrick earlier this week.  His e-mail address- [email or web address]  4. I confirmed his address and informed him I would be mailing a packet of information and couple of forms he will need to complete. These will be discussed during his consult.   He voiced understanding of the above. I informed him that I am working remotely and asked him to call me if he has any questions or concerns regarding his appointments or the forms he needs to complete.

## 2018-05-06 ENCOUNTER — Ambulatory Visit: Payer: PPO | Admitting: Radiation Oncology

## 2018-05-09 ENCOUNTER — Encounter: Payer: Self-pay | Admitting: Medical Oncology

## 2018-05-15 ENCOUNTER — Telehealth: Payer: Self-pay | Admitting: Oncology

## 2018-05-15 ENCOUNTER — Telehealth: Payer: Self-pay | Admitting: Medical Oncology

## 2018-05-15 NOTE — Telephone Encounter (Signed)
Spoke with patient to confirm [facility] WebEx for 5/8 at 8:30 am. We did a test WebEx and we were able to connect without problems. He is aware I will call him when conference is completed before starting WebEx.

## 2018-05-15 NOTE — Telephone Encounter (Signed)
Left vm with pt to call back and confirm Telehealth mtg. For 05/16/18

## 2018-05-16 ENCOUNTER — Encounter: Payer: Self-pay | Admitting: Radiation Oncology

## 2018-05-16 ENCOUNTER — Inpatient Hospital Stay: Payer: PPO | Attending: Oncology | Admitting: Oncology

## 2018-05-16 ENCOUNTER — Encounter: Payer: Self-pay | Admitting: Medical Oncology

## 2018-05-16 ENCOUNTER — Ambulatory Visit
Admission: RE | Admit: 2018-05-16 | Discharge: 2018-05-16 | Disposition: A | Discharge status: Home / Self Care | Payer: PPO | Source: Ambulatory Visit | Attending: Radiation Oncology | Admitting: Radiation Oncology

## 2018-05-16 DIAGNOSIS — Z79899 Other long term (current) drug therapy: Secondary | ICD-10-CM

## 2018-05-16 DIAGNOSIS — Z87891 Personal history of nicotine dependence: Secondary | ICD-10-CM

## 2018-05-16 DIAGNOSIS — Z7982 Long term (current) use of aspirin: Secondary | ICD-10-CM

## 2018-05-16 DIAGNOSIS — C61 Malignant neoplasm of prostate: Secondary | ICD-10-CM | POA: Diagnosis not present

## 2018-05-16 DIAGNOSIS — Z8042 Family history of malignant neoplasm of prostate: Secondary | ICD-10-CM | POA: Diagnosis not present

## 2018-05-16 DIAGNOSIS — R972 Elevated prostate specific antigen [PSA]: Secondary | ICD-10-CM | POA: Diagnosis not present

## 2018-05-16 NOTE — Consult Note (Signed)
Telehealth Visit     05/16/2018   --------------------------------------------------------------------------------   Juan Hunt  MRN: [id number]  PRIMARY CARE:  Ria Bush, MD  DOB: [birth year], 68 year old Male  REFERRING:  [name], MD  SSN: -[id number] [id number]  PROVIDER:  Louis Meckel, M.D.    TREATING:  Raynelle Bring, M.D.    LOCATION:  [facility] [phone]   --------------------------------------------------------------------------------    CC/HPI: CC: Prostate Cancer   Physician requesting consult: Dr. Burman Nieves  PCP: Dr. Ria Bush  Telehealth visit with the Prostate Cancer Multidisciplinary Group   [name] is a 68 year old gentleman with a strong family history of prostate cancer including a father who underwent a radical prostatectomy in the past and is still alive at age 74 and two brothers that reportedly have been diagnosed with prostate cancer. He was initially diagnosed with prostate cancer in April 2017 when he was noted to have an elevated PSA of 5.87. He underwent a TRUS biopsy of the prostate on 05/03/15 that confirmed Gleason 3+3=6 adenocarcinoma of the prostate with 5 out of 12 biopsy cores positive for malignancy. After discussion options, he elected to proceed with active surveillance. He underwent a confirmatory biopsy in August 2017 that confirmed Gleason 3+3=6 adenocarcinoma again in a similar percentage of biopsies but now with bilateral instead of unilateral disease. He continued with surveillance. In May 2018, he underwent an MRI of the prostate that did demonstrate a PI-RADS 3 lesion on the left side of the prostate without other concerning findings and without EPE, SVI, or LAD. His PSA was most recently 6.77 in April 2020 and he underwent a surveillance protocol biopsy on 04/21/18 that demonstrated upgraded Gleason 3+4=7 adenocarcinoma with 7 out of 12 biopsy cores positive for malignancy.   Family history: Father s/p RP  (still alive at 22), two brothers with prostate cancer   Imaging studies: No recent staging studies   PMH: He has a history of GERD and hyperlipidemia.  PSH: No abdominal surgeries.   TNM stage: cT1c Nx Mx  PSA: 6.67  Gleason score: 4+3=7  Biopsy (04/21/18): 7/12 cores positive  Left: L lateral apex (10%, 3+3=6)  Right: R apex (50%, 3+3=6), R lateral apex (50%, 3+4=7), R mid (60%, 3+4=7), R lateral mid (50%, 3+4=7), R base (50%), R lateral base (40%, 4+3=7, PNI)  Prostate volume: 28.7 cc   Nomogram  OC disease: 29%  EPE: 68%  SVI: 12%  LNI: 11%  PFS (5 year, 10 year): 61%, 46%   Urinary function: He states that he has fairly minimal baseline lower urinary tract symptoms.  Erectile function: He has fairly good erectile function utilizing sildenafil 100 mg prn.     ALLERGIES: No Allergies    MEDICATIONS: Sildenafil Citrate 20 mg tablet 2-5 tablets daily as needed  Meloxicam  Sildenafil     GU PSH: Prostate Needle Biopsy - 04/21/2018    NON-GU PSH: Surgical Pathology, Gross And Microscopic Examination For Prostate Needle - 04/21/2018    GU PMH: ED due to arterial insufficiency - 10/22/2017 Prostate Cancer - 10/22/2017, - 04/08/2017, - 03/04/2017    NON-GU PMH: No Non-GU PMH    FAMILY HISTORY: No Family History    SOCIAL HISTORY: Marital Status: Single Preferred Language: English; Race: Black or African American Current Smoking Status: Patient does not smoke anymore.   Tobacco Use Assessment Completed: Used Tobacco in last 30 days? Has never drank.  Drinks 3 caffeinated drinks per day. Has not had a blood transfusion.  REVIEW OF SYSTEMS:    GU Review Male:   Patient denies frequent urination, hard to postpone urination, burning/ pain with urination, get up at night to urinate, leakage of urine, stream starts and stops, trouble starting your streams, and have to strain to urinate .  Gastrointestinal (Upper):   Patient denies nausea and vomiting.  Gastrointestinal  (Lower):   Patient denies diarrhea and constipation.  Constitutional:   Patient denies fever, night sweats, weight loss, and fatigue.  Skin:   Patient denies skin rash/ lesion and itching.  Eyes:   Patient denies blurred vision and double vision.  Ears/ Nose/ Throat:   Patient denies sore throat and sinus problems.  Hematologic/Lymphatic:   Patient denies swollen glands and easy bruising.  Cardiovascular:   Patient denies leg swelling and chest pains.  Respiratory:   Patient denies cough and shortness of breath.  Endocrine:   Patient denies excessive thirst.  Musculoskeletal:   Patient denies back pain and joint pain.  Neurological:   Patient denies headaches and dizziness.  Psychologic:   Patient denies depression and anxiety.   PAST DATA REVIEWED:  Source Of History:  Patient  Lab Test Review:   PSA  Records Review:   Pathology Reports, Previous Patient Records   04/14/18 10/15/17 04/01/17 02/25/17  PSA  Total PSA 6.77 ng/mL 7.00 ng/mL 6.13 ng/mL 9.13 ng/mL  Free PSA  0.68 ng/mL    % Free PSA  10 % PSA      PROCEDURES:          Teleheatlh This patient encounter is appropriate and reasonable under the circumstances given the patient's particular presentation at this time. The patient has been advised of the potential risks and limitations of this mode of treatment (including, but not limited to, the absence of in-person examination) and has agreed to be treated in a remote fashion in spite of them.   Any and all of the patient's/patient's family's questions on this issue have been answered, and I have made no promises or guarantees to the patient. The patient has also been advised to contact this office for worsening conditions or problems, and seek emergency medical treatment and/or call 911 if the patient deems either necessary.    ASSESSMENT:      ICD-10 Details  1 GU:   Prostate Cancer - C61      PLAN:           Document Letter(s):  Created for Patient: Clinical Summary          Notes:   1. Unfavorable intermediate risk prostate cancer: I had a long discussion with Mr. Astarita today. He is quite well informed and has met with Dr. Alen Blew and Dr. Tammi Klippel earlier this morning. The patient was counseled about the natural history of prostate cancer and the standard treatment options that are available for prostate cancer. It was explained to him how his age and life expectancy, clinical stage, Gleason score, and PSA affect his prognosis, the decision to proceed with additional staging studies, as well as how that information influences recommended treatment strategies. We discussed the roles for active surveillance, radiation therapy, surgical therapy, androgen deprivation, as well as ablative therapy options for the treatment of prostate cancer as appropriate to his individual cancer situation. We discussed the risks and benefits of these options with regard to their impact on cancer control and also in terms of potential adverse events, complications, and impact on quality of life particularly related to urinary and sexual function. The patient  was encouraged to ask questions throughout the discussion today and all questions were answered to his stated satisfaction. In addition, the patient was provided with and/or directed to appropriate resources and literature for further education about prostate cancer and treatment options.   I answered numerous questions for him today regarding his prostate cancer situation and specifically about surgical treatment for prostate cancer. We discussed surgical therapy for prostate cancer including the different available surgical approaches. We discussed, in detail, the risks and expectations of surgery with regard to cancer control, urinary control, and erectile function as well as the expected postoperative recovery process. Additional risks of surgery including but not limited to bleeding, infection, hernia formation, nerve damage,  lymphocele formation, bowel/rectal injury potentially necessitating colostomy, damage to the urinary tract resulting in urine leakage, urethral stricture, and the cardiopulmonary risks such as myocardial infarction, stroke, death, venothromboembolism, etc. were explained. The risk of open surgical conversion for robotic/laparoscopic prostatectomy was also discussed.   He is very well informed and does wish to proceed with therapy of curative intent. He plans to make a final decision over the next month or so and will let Dr. Louis Meckel know. All questions were answered to his stated satisfaction today.   Cc: Dr. Burman Nieves  Dr. Ria Bush     E & M CODE: I spent at least 42 minutes face to face with the patient, more than 50% of that time was spent on counseling and/or coordinating care.
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
## 2018-05-19 NOTE — Progress Notes (Signed)
                               Care Plan Summary  Name:  DOB:    Your Medical Team:   Urologist -  Dr. Raynelle Bring, [facility]  Radiation Oncologist - Dr. Tyler Pita, [facility]   Medical Oncologist - Dr. Zola Button, [place]  Recommendations: 1) Robotic prostatectomy or 2) Short term hormone therapy with 5 1/2 weeks of radiation 3)  * These recommendations are based on information available as of today's consult.      Recommendations may change depending on the results of further tests or exams.    Next Steps: 1) Consider your options and call Cira Rue, RN with your decision    When appointments need to be scheduled, you will be contacted by [facility] and/or [facility].  Questions?  Please do not hesitate to call Cira Rue, RN, BSN, OCN at [phone] any questions or concerns.  Shirlean Mylar is your Oncology Nurse Navigator and is available to assist you while you're receiving your medical care at [facility].

## 2018-05-22 ENCOUNTER — Encounter: Payer: Self-pay | Admitting: General Practice

## 2018-05-22 NOTE — Progress Notes (Signed)
[facility] Spiritual Care Note  LVM as Patient and [place] follow-up after Mr Besecker attended [facility]. Encouraged callback.   [place], [place], [facility] Pager [phone] Voicemail [phone]

## 2018-05-26 ENCOUNTER — Telehealth: Payer: Self-pay | Admitting: *Deleted

## 2018-05-26 NOTE — Telephone Encounter (Signed)
-----   Message from Jerene Bears, MD sent at 05/26/2018  1:28 PM EDT ----- Proceeding with colonoscopy should not interfere with prostate cancer treatment and plans for surgery in August. I would hold off/delay colonoscopy if he is receiving systemic chemotherapy or radiation therapy. He is not receiving chemotherapy or radiation, it is okay to proceed with surveillance colonoscopy. ----- Message ----- From: Larina Bras, CMA Sent: 05/24/2018 To: Larina Bras, CMA, Jerene Bears, MD  Called patient regarding scheduling for recall colon due to his personal hx colon polyps and family hx colon polyps. Patient indicates that he is possibly having prostate resection in August for Prostate ca. Wants to know if this would be a problem for him to have procedure prior to august or if he should wait... please see epic and advise.Marland KitchenMarland KitchenMarland Kitchen

## 2018-05-26 NOTE — Telephone Encounter (Signed)
Left voicemail for patient to call back. 

## 2018-05-27 NOTE — Telephone Encounter (Signed)
I have spoken to patient to give Dr Vena Rua recommendation regarding colonoscopy during prostate cancer treatment. He verbalizes understanding and indicates that he is not currently undergoing chemotherapy or radiation at this time. He has scheduled colonoscopy for 06/24/18 at 130 pm. He is also scheduled for previsit on 06/12/18 at 330 pm.

## 2018-05-28 ENCOUNTER — Telehealth: Payer: Self-pay | Admitting: Family Medicine

## 2018-05-28 DIAGNOSIS — R7303 Prediabetes: Secondary | ICD-10-CM

## 2018-05-28 DIAGNOSIS — N138 Other obstructive and reflux uropathy: Secondary | ICD-10-CM

## 2018-05-28 DIAGNOSIS — E785 Hyperlipidemia, unspecified: Secondary | ICD-10-CM

## 2018-05-28 DIAGNOSIS — C61 Malignant neoplasm of prostate: Secondary | ICD-10-CM

## 2018-05-28 NOTE — Telephone Encounter (Signed)
Pt is going out of town this Sunday and he has a myChart visit scheduled with you on 06/09/18. I scheduled him for labs on 05/30/18 but he needs orders placed. He has a virtual visit with Lesia on 06/04/18. Will you please put in the orders so he can get his blood work done ahead of time?  Thanks

## 2018-05-28 NOTE — Telephone Encounter (Signed)
Labs ordered.

## 2018-05-29 ENCOUNTER — Encounter: Payer: Self-pay | Admitting: Internal Medicine

## 2018-05-29 ENCOUNTER — Other Ambulatory Visit: Payer: Self-pay | Admitting: Family Medicine

## 2018-05-29 DIAGNOSIS — E785 Hyperlipidemia, unspecified: Secondary | ICD-10-CM

## 2018-05-29 DIAGNOSIS — R7303 Prediabetes: Secondary | ICD-10-CM

## 2018-05-29 DIAGNOSIS — C61 Malignant neoplasm of prostate: Secondary | ICD-10-CM

## 2018-05-30 ENCOUNTER — Other Ambulatory Visit (INDEPENDENT_AMBULATORY_CARE_PROVIDER_SITE_OTHER): Payer: PPO

## 2018-05-30 DIAGNOSIS — R7303 Prediabetes: Secondary | ICD-10-CM | POA: Diagnosis not present

## 2018-05-30 DIAGNOSIS — N138 Other obstructive and reflux uropathy: Secondary | ICD-10-CM

## 2018-05-30 DIAGNOSIS — C61 Malignant neoplasm of prostate: Secondary | ICD-10-CM

## 2018-05-30 DIAGNOSIS — N401 Enlarged prostate with lower urinary tract symptoms: Secondary | ICD-10-CM

## 2018-05-30 DIAGNOSIS — E785 Hyperlipidemia, unspecified: Secondary | ICD-10-CM | POA: Diagnosis not present

## 2018-05-30 LAB — PSA: PSA: 7.19 ng/mL — ABNORMAL HIGH (ref 0.10–4.00)

## 2018-05-30 LAB — CBC WITH DIFFERENTIAL/PLATELET
Basophils Absolute: 0 10*3/uL (ref 0.0–0.1)
Basophils Relative: 0.4 % (ref 0.0–3.0)
Eosinophils Absolute: 0 10*3/uL (ref 0.0–0.7)
Eosinophils Relative: 0.6 % (ref 0.0–5.0)
HCT: 43 % (ref 39.0–52.0)
Hemoglobin: 14.3 g/dL (ref 13.0–17.0)
Lymphocytes Relative: 53.8 % — ABNORMAL HIGH (ref 12.0–46.0)
Lymphs Abs: 2.1 10*3/uL (ref 0.7–4.0)
MCHC: 33.2 g/dL (ref 30.0–36.0)
MCV: 82.4 fl (ref 78.0–100.0)
Monocytes Absolute: 0.4 10*3/uL (ref 0.1–1.0)
Monocytes Relative: 10.8 % (ref 3.0–12.0)
Neutro Abs: 1.3 10*3/uL — ABNORMAL LOW (ref 1.4–7.7)
Neutrophils Relative %: 34.4 % — ABNORMAL LOW (ref 43.0–77.0)
Platelets: 187 10*3/uL (ref 150.0–400.0)
RBC: 5.22 Mil/uL (ref 4.22–5.81)
RDW: 14.5 % (ref 11.5–15.5)
WBC: 3.9 10*3/uL — ABNORMAL LOW (ref 4.0–10.5)

## 2018-05-30 LAB — LIPID PANEL
Cholesterol: 207 mg/dL — ABNORMAL HIGH (ref 0–200)
HDL: 37.3 mg/dL — ABNORMAL LOW (ref >39.00)
LDL Cholesterol: 150 mg/dL — ABNORMAL HIGH (ref 0–99)
NonHDL: 169.33
Total CHOL/HDL Ratio: 6
Triglycerides: 98 mg/dL (ref 0.0–149.0)
VLDL: 19.6 mg/dL (ref 0.0–40.0)

## 2018-05-30 LAB — COMPREHENSIVE METABOLIC PANEL
ALT: 26 U/L (ref 0–53)
AST: 20 U/L (ref 0–37)
Albumin: 4.1 g/dL (ref 3.5–5.2)
Alkaline Phosphatase: 73 U/L (ref 39–117)
BUN: 15 mg/dL (ref 6–23)
CO2: 29 mEq/L (ref 19–32)
Calcium: 9.4 mg/dL (ref 8.4–10.5)
Chloride: 103 mEq/L (ref 96–112)
Creatinine, Ser: 0.98 mg/dL (ref 0.40–1.50)
GFR: 92 mL/min (ref >60.00)
Glucose, Bld: 100 mg/dL — ABNORMAL HIGH (ref 70–99)
Potassium: 4.7 mEq/L (ref 3.5–5.1)
Sodium: 138 mEq/L (ref 135–145)
Total Bilirubin: 0.3 mg/dL (ref 0.2–1.2)
Total Protein: 7.1 g/dL (ref 6.0–8.3)

## 2018-05-30 LAB — TSH: TSH: 1.95 u[IU]/mL (ref 0.35–4.50)

## 2018-05-30 LAB — HEMOGLOBIN A1C: Hgb A1c MFr Bld: 6.2 % (ref 4.6–6.5)

## 2018-06-03 ENCOUNTER — Encounter: Payer: Self-pay | Admitting: Medical Oncology

## 2018-06-03 ENCOUNTER — Telehealth: Payer: Self-pay | Admitting: Medical Oncology

## 2018-06-03 NOTE — Telephone Encounter (Signed)
Left message to follow up post PMDC. I asked him to return my call to discuss treatment decision and address any questions or concerns.I informed him the June prostate support group will be held via Zoom. He will receive an e-mail with invite and directions.

## 2018-06-04 ENCOUNTER — Ambulatory Visit: Payer: PPO

## 2018-06-06 ENCOUNTER — Ambulatory Visit (INDEPENDENT_AMBULATORY_CARE_PROVIDER_SITE_OTHER): Payer: PPO

## 2018-06-06 DIAGNOSIS — Z0000 Encounter for general adult medical examination without abnormal findings: Secondary | ICD-10-CM | POA: Diagnosis not present
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
## 2018-06-06 NOTE — Progress Notes (Signed)
PCP notes:   Health maintenance:  Colonoscopy - per pt, future appt scheduled in Jun 20 PNA vaccine - PCP will determine if PCV13 will be given at next appt  Abnormal screenings:   None  Patient concerns:   None  Nurse concerns:  None  Next PCP appt:   06/09/18 @ 1415

## 2018-06-06 NOTE — Patient Instructions (Signed)
Juan Hunt , Thank you for taking time to come for your Medicare Wellness Visit. I appreciate your ongoing commitment to your health goals. Please review the following plan we discussed and let me know if I can assist you in the future.   These are the goals we discussed: Goals    . Patient Stated     Starting 06/06/18, I will continue to take medications as prescribed.        This is a list of the screening recommended for you and due dates:  Health Maintenance  Topic Date Due  . Pneumonia vaccines (2 of 2 - PCV13) 10/24/2017  . Colon Cancer Screening  04/24/2018  . Flu Shot  08/09/2018  . DTaP/Tdap/Td vaccine (2 - Td) 02/20/2024  . Tetanus Vaccine  02/20/2024  .  Hepatitis C: One time screening is recommended by Center for Disease Control  (CDC) for  adults born from [birth year] through 1965.   Completed   Preventive Care for Adults  A healthy lifestyle and preventive care can promote health and wellness. Preventive health guidelines for adults include the following key practices.  . A routine yearly physical is a good way to check with your health care provider about your health and preventive screening. It is a chance to share any concerns and updates on your health and to receive a thorough exam.  . Visit your dentist for a routine exam and preventive care every 6 months. Brush your teeth twice a day and floss once a day. Good oral hygiene prevents tooth decay and gum disease.  . The frequency of eye exams is based on your age, health, family medical history, use  of contact lenses, and other factors. Follow your health care provider's recommendations for frequency of eye exams.  . Eat a healthy diet. Foods like vegetables, fruits, whole grains, low-fat dairy products, and lean protein foods contain the nutrients you need without too many calories. Decrease your intake of foods high in solid fats, added sugars, and salt. Eat the right amount of calories for you. Get information about a  proper diet from your health care provider, if necessary.  . Regular physical exercise is one of the most important things you can do for your health. Most adults should get at least 150 minutes of moderate-intensity exercise (any activity that increases your heart rate and causes you to sweat) each week. In addition, most adults need muscle-strengthening exercises on 2 or more days a week.  Silver Sneakers may be a benefit available to you. To determine eligibility, you may visit the website: www.silversneakers.com or contact program at [phone] Mon-Fri between 8AM-8PM.   . Maintain a healthy weight. The body mass index (BMI) is a screening tool to identify possible weight problems. It provides an estimate of body fat based on height and weight. Your health care provider can find your BMI and can help you achieve or maintain a healthy weight.   For adults 20 years and older: ? A BMI below 18.5 is considered underweight. ? A BMI of 18.5 to 24.9 is normal. ? A BMI of 25 to 29.9 is considered overweight. ? A BMI of 30 and above is considered obese.   . Maintain normal blood lipids and cholesterol levels by exercising and minimizing your intake of saturated fat. Eat a balanced diet with plenty of fruit and vegetables. Blood tests for lipids and cholesterol should begin at age 22 and be repeated every 5 years. If your lipid or cholesterol levels are  high, you are over 50, or you are at high risk for heart disease, you may need your cholesterol levels checked more frequently. Ongoing high lipid and cholesterol levels should be treated with medicines if diet and exercise are not working.  . If you smoke, find out from your health care provider how to quit. If you do not use tobacco, please do not start.  . If you choose to drink alcohol, please do not consume more than 2 drinks per day. One drink is considered to be 12 ounces (355 mL) of beer, 5 ounces (148 mL) of wine, or 1.5 ounces (44 mL) of  liquor.  . If you are 70-72 years old, ask your health care provider if you should take aspirin to prevent strokes.  . Use sunscreen. Apply sunscreen liberally and repeatedly throughout the day. You should seek shade when your shadow is shorter than you. Protect yourself by wearing long sleeves, pants, a wide-brimmed hat, and sunglasses year round, whenever you are outdoors.  . Once a month, do a whole body skin exam, using a mirror to look at the skin on your back. Tell your health care provider of new moles, moles that have irregular borders, moles that are larger than a pencil eraser, or moles that have changed in shape or color.

## 2018-06-09 ENCOUNTER — Telehealth: Payer: PPO | Admitting: Family Medicine

## 2018-06-09 ENCOUNTER — Encounter: Payer: Self-pay | Admitting: Family Medicine

## 2018-06-09 HISTORY — PX: COLONOSCOPY: SHX174

## 2018-06-09 MED ORDER — MELOXICAM 15 MG PO TABS: 15.0000 mg | ORAL_TABLET | Freq: Every day | ORAL | 1 refills | Status: DC

## 2018-06-09 NOTE — Progress Notes (Signed)
I was late to appointment. Patient had to reschedule. Rescheduled to Wednesday 4:30pm.   Virtual visit completed through [place]. Due to national recommendations of social distancing due to COVID-19, a virtual visit is felt to be most appropriate for this patient at this time. Reviewed limitations of a virtual visit.   Patient location: home Provider location: [place] at [facility], office If any vitals were documented, they were collected by patient at home unless specified below.    Ht 5\' 10"  (1.778 m)   BMI 32.14 kg/m    CC: AMW f/u visit Subjective:    Patient ID: Juan Hunt, male    DOB: 07-06-1950, 68 y.o.   MRN: [id number]  HPI: Juan Hunt is a 67 y.o. male presenting on 06/09/2018 for Annual Exam (Pt 2. )   Saw Katha Cabal last week for medicare wellness visit. Note reviewed.   Preventative: COLONOSCOPY Date: 04/2013 2 TA, mod diverticulosis, rpt 5 yrs (Pyrtle) Intermediate risk prostate cancer- followed by urology Dr Louis Meckel, Alen Blew and Tammi Klippel - leaning towards prostatectomy.  Lung cancer screening -Not eligible.Ex smoker quit 1999 - smoked 1/2 ppd for about 12 yrs.  Flu shot - yearly Pneumovax 10/2016. Prevnar -  Tdap2/2016 zostavax - 2014 Shingrix - discussed Advanced directive discussion -has not set up. Has packet at home. Would want daughter Juan Hunt to be HCPOA. Seat belt use discussed Sunscreen use discussed,no changing moles on skin Ex smoker - quit 1999 Alcohol - none     Relevant past medical, surgical, family and social history reviewed and updated as indicated. Interim medical history since our last visit reviewed. Allergies and medications reviewed and updated. Outpatient Medications Prior to Visit  Medication Sig Dispense Refill  . acetaminophen (TYLENOL) 500 MG tablet Take 500 mg by mouth every 6 (six) hours as needed for moderate pain.    Marland Kitchen aspirin EC 81 MG tablet Take 1 tablet (81 mg total) by mouth daily.    Marland Kitchen atorvastatin (LIPITOR) 40 MG  tablet Take 1 tablet (40 mg total) by mouth daily. 30 tablet 11  . Garlic Oil 6270 MG CAPS     . Multiple Vitamin (MULTIVITAMIN) tablet Take 1 tablet by mouth daily.    . Omega-3 Fatty Acids (OMEGA 3 PO) Take 1 capsule by mouth daily.    . sildenafil (REVATIO) 20 MG tablet Take 2-5 tablets (40-100 mg total) by mouth daily as needed (relations). 30 tablet 3  . Specialty Vitamins Products (PROSTATE PO)     . Wheat Dextrin (BENEFIBER) POWD Take 1 scoop by mouth daily. [place]    . meloxicam (MOBIC) 15 MG tablet Take 1 tablet (15 mg total) by mouth daily. 90 tablet 1   No facility-administered medications prior to visit.      Per HPI unless specifically indicated in ROS section below Review of Systems Objective:    Ht 5\' 10"  (1.778 m)   BMI 32.14 kg/m   Wt Readings from Last 3 Encounters:  02/25/18 224 lb (101.6 kg)  12/17/17 224 lb 8 oz (101.8 kg)  11/11/17 220 lb (99.8 kg)     Physical exam: Gen: alert, NAD, not ill appearing Pulm: speaks in complete sentences without increased work of breathing Psych: normal mood, normal thought content      Results for orders placed or performed in visit on 05/30/18  CBC with Differential/Platelet  Result Value Ref Range   WBC 3.9 (L) 4.0 - 10.5 K/uL   RBC 5.22 4.22 - 5.81 Mil/uL   Hemoglobin  14.3 13.0 - 17.0 g/dL   HCT 43.0 39.0 - 52.0 %   MCV 82.4 78.0 - 100.0 fl   MCHC 33.2 30.0 - 36.0 g/dL   RDW 14.5 11.5 - 15.5 %   Platelets 187.0 150.0 - 400.0 K/uL   Neutrophils Relative % 34.4 (L) 43.0 - 77.0 %   Lymphocytes Relative 53.8 (H) 12.0 - 46.0 %   Monocytes Relative 10.8 3.0 - 12.0 %   Eosinophils Relative 0.6 0.0 - 5.0 %   Basophils Relative 0.4 0.0 - 3.0 %   Neutro Abs 1.3 (L) 1.4 - 7.7 K/uL   Lymphs Abs 2.1 0.7 - 4.0 K/uL   Monocytes Absolute 0.4 0.1 - 1.0 K/uL   Eosinophils Absolute 0.0 0.0 - 0.7 K/uL   Basophils Absolute 0.0 0.0 - 0.1 K/uL  PSA  Result Value Ref Range   PSA 7.19 (H) 0.10 - 4.00 ng/mL  Hemoglobin A1c   Result Value Ref Range   Hgb A1c MFr Bld 6.2 4.6 - 6.5 %  TSH  Result Value Ref Range   TSH 1.95 0.35 - 4.50 uIU/mL  Comprehensive metabolic panel  Result Value Ref Range   Sodium 138 135 - 145 mEq/L   Potassium 4.7 3.5 - 5.1 mEq/L   Chloride 103 96 - 112 mEq/L   CO2 29 19 - 32 mEq/L   Glucose, Bld 100 (H) 70 - 99 mg/dL   BUN 15 6 - 23 mg/dL   Creatinine, Ser 0.98 0.40 - 1.50 mg/dL   Total Bilirubin 0.3 0.2 - 1.2 mg/dL   Alkaline Phosphatase 73 39 - 117 U/L   AST 20 0 - 37 U/L   ALT 26 0 - 53 U/L   Total Protein 7.1 6.0 - 8.3 g/dL   Albumin 4.1 3.5 - 5.2 g/dL   Calcium 9.4 8.4 - 10.5 mg/dL   GFR 92.00 >60.00 mL/min  Lipid panel  Result Value Ref Range   Cholesterol 207 (H) 0 - 200 mg/dL   Triglycerides 98.0 0.0 - 149.0 mg/dL   HDL 37.30 (L) >39.00 mg/dL   VLDL 19.6 0.0 - 40.0 mg/dL   LDL Cholesterol 150 (H) 0 - 99 mg/dL   Total CHOL/HDL Ratio 6    NonHDL 169.33    Assessment & Plan:   Problem List Items Addressed This Visit    Prostate cancer ([place]) - Primary       Meds ordered this encounter  Medications  . meloxicam (MOBIC) 15 MG tablet    Sig: Take 1 tablet (15 mg total) by mouth daily.    Dispense:  90 tablet    Refill:  1   No orders of the defined types were placed in this encounter.   I discussed the assessment and treatment plan with the patient. The patient was provided an opportunity to ask questions and all were answered. The patient agreed with the plan and demonstrated an understanding of the instructions. The patient was advised to call back or seek an in-person evaluation if the symptoms worsen or if the condition fails to improve as anticipated.  Follow up plan: No follow-ups on file.  Ria Bush, MD

## 2018-06-11 ENCOUNTER — Encounter: Payer: Self-pay | Admitting: Family Medicine

## 2018-06-11 ENCOUNTER — Telehealth (INDEPENDENT_AMBULATORY_CARE_PROVIDER_SITE_OTHER): Payer: PPO | Admitting: Family Medicine

## 2018-06-11 VITALS — Ht 70.0 in | Wt 225.0 lb

## 2018-06-11 DIAGNOSIS — C61 Malignant neoplasm of prostate: Secondary | ICD-10-CM

## 2018-06-11 DIAGNOSIS — R6889 Other general symptoms and signs: Secondary | ICD-10-CM

## 2018-06-11 DIAGNOSIS — R7303 Prediabetes: Secondary | ICD-10-CM

## 2018-06-11 DIAGNOSIS — M25571 Pain in right ankle and joints of right foot: Secondary | ICD-10-CM

## 2018-06-11 DIAGNOSIS — E785 Hyperlipidemia, unspecified: Secondary | ICD-10-CM

## 2018-06-11 DIAGNOSIS — E669 Obesity, unspecified: Secondary | ICD-10-CM

## 2018-06-11 DIAGNOSIS — K760 Fatty (change of) liver, not elsewhere classified: Secondary | ICD-10-CM | POA: Diagnosis not present

## 2018-06-11 DIAGNOSIS — G8929 Other chronic pain: Secondary | ICD-10-CM | POA: Diagnosis not present

## 2018-06-11 DIAGNOSIS — R0989 Other specified symptoms and signs involving the circulatory and respiratory systems: Secondary | ICD-10-CM

## 2018-06-11 DIAGNOSIS — N281 Cyst of kidney, acquired: Secondary | ICD-10-CM | POA: Diagnosis not present

## 2018-06-11 MED ORDER — ATORVASTATIN CALCIUM 40 MG PO TABS: 40.0000 mg | ORAL_TABLET | Freq: Every day | ORAL | 3 refills | Status: DC

## 2018-06-11 NOTE — Assessment & Plan Note (Signed)
Encouraged avoiding added sugars in diet.  

## 2018-06-11 NOTE — Assessment & Plan Note (Addendum)
Reviewing chart, last MRI for Bosniak 3F R renal cyst was 09/2015, I don't see where this was followed up - possibly done by urology. I will call pt tomorrow to touch base about this.  ==>ADDENDUM spoke with patient today (6/4) unsure if this has been followed recently. Will order renal US for further eval.
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
## 2018-06-11 NOTE — Assessment & Plan Note (Signed)
Appreciate uro/onc care - pt has decided to pursue prostatectomy.

## 2018-06-11 NOTE — Assessment & Plan Note (Addendum)
Chronic, stable on lipitor 40mg  daily. Despite this LDL remains elevated - consider increased potency statin vs full dose atorva. The 10-year ASCVD risk score Juan Hunt., et al., 2013) is: 13.4%   Values used to calculate the score:     Age: 68 years     Sex: Male     Is Non-Hispanic African American: Yes     Diabetic: No     Tobacco smoker: No     Systolic Blood Pressure: 021 mmHg     Is BP treated: No     HDL Cholesterol: 37.3 mg/dL     Total Cholesterol: 207 mg/dL

## 2018-06-11 NOTE — Assessment & Plan Note (Signed)
Activity limited by chronic ankle pain.

## 2018-06-11 NOTE — Assessment & Plan Note (Signed)
Daily PPI did not help.  Anticipate allergic rhinitis related - rec trial of claritin or other OTC non-sedating antihistamine daily.

## 2018-06-12 ENCOUNTER — Other Ambulatory Visit: Payer: Self-pay

## 2018-06-12 ENCOUNTER — Ambulatory Visit (AMBULATORY_SURGERY_CENTER): Payer: Self-pay | Admitting: *Deleted

## 2018-06-12 VITALS — Ht 70.0 in | Wt 220.6 lb

## 2018-06-12 DIAGNOSIS — Z86010 Personal history of colonic polyps: Secondary | ICD-10-CM

## 2018-06-12 MED ORDER — PEG 3350-KCL-NA BICARB-NACL 420 G PO SOLR: ORAL | 0 refills | Status: DC

## 2018-06-12 NOTE — Progress Notes (Signed)
PV done in person  No egg or soy allergy  No home oxygen or hx of sleep apnea  No diet medications  Registered in emmi  No anesthesia or intubation problems per pt.

## 2018-06-17 ENCOUNTER — Encounter: Payer: Self-pay | Admitting: Internal Medicine

## 2018-06-17 ENCOUNTER — Ambulatory Visit
Admission: RE | Admit: 2018-06-17 | Discharge: 2018-06-17 | Disposition: A | Discharge status: Home / Self Care | Payer: PPO | Source: Ambulatory Visit | Attending: Family Medicine | Admitting: Family Medicine

## 2018-06-17 DIAGNOSIS — N281 Cyst of kidney, acquired: Secondary | ICD-10-CM | POA: Diagnosis not present

## 2018-06-20 NOTE — Progress Notes (Signed)
I reviewed health advisor's note, was available for consultation, and agree with documentation and plan.  

## 2018-06-23 ENCOUNTER — Telehealth: Payer: Self-pay | Admitting: *Deleted

## 2018-06-23 NOTE — Telephone Encounter (Signed)
Covid-19 screening questions   Do you now or have you had a fever in the last 14 days?no  Do you have any respiratory symptoms of shortness of breath or cough now or in the last 14 days?no  Do you have any family members or close contacts with diagnosed or suspected Covid-19 in the past 14 days?no  Have you been tested for Covid-19 and found to be positive?no  PT aware of care partner policy and will bring a mask with him. SM

## 2018-06-23 NOTE — Telephone Encounter (Signed)
No answer for first COVID 19 screening. Left message and will call back. SM

## 2018-06-24 ENCOUNTER — Encounter: Payer: Self-pay | Admitting: Internal Medicine

## 2018-06-24 ENCOUNTER — Ambulatory Visit (AMBULATORY_SURGERY_CENTER): Payer: PPO | Admitting: Internal Medicine

## 2018-06-24 ENCOUNTER — Other Ambulatory Visit: Payer: Self-pay

## 2018-06-24 ENCOUNTER — Encounter: Payer: PPO | Admitting: Internal Medicine

## 2018-06-24 VITALS — BP 139/69 | HR 53 | Temp 98.6°F | Resp 14 | Ht 70.0 in | Wt 224.0 lb

## 2018-06-24 DIAGNOSIS — Z86010 Personal history of colonic polyps: Secondary | ICD-10-CM

## 2018-06-24 DIAGNOSIS — E785 Hyperlipidemia, unspecified: Secondary | ICD-10-CM | POA: Diagnosis not present

## 2018-06-24 MED ORDER — SODIUM CHLORIDE 0.9 % IV SOLN: 500.0000 mL | Freq: Once | INTRAVENOUS | Status: DC

## 2018-06-24 NOTE — Op Note (Signed)
[facility] Patient Name: Juan Hunt Procedure Date: 06/24/2018 1:33 PM MRN: [id number] Endoscopist: Jerene Bears , MD Age: 68 Referring MD:  Date of Birth: 1950/11/18 Gender: Male Account #: [id number] Procedure:                Colonoscopy Indications:              High risk colon cancer surveillance: Personal                            history of non-advanced adenoma, Last colonoscopy 5                            years ago Medicines:                Monitored Anesthesia Care Procedure:                Pre-Anesthesia Assessment:                           - Prior to the procedure, a History and Physical                            was performed, and patient medications and                            allergies were reviewed. The patient's tolerance of                            previous anesthesia was also reviewed. The risks                            and benefits of the procedure and the sedation                            options and risks were discussed with the patient.                            All questions were answered, and informed consent                            was obtained. Prior Anticoagulants: The patient has                            taken no previous anticoagulant or antiplatelet                            agents. ASA Grade Assessment: II - A patient with                            mild systemic disease. After reviewing the risks                            and benefits, the patient was deemed in  satisfactory condition to undergo the procedure.                           After obtaining informed consent, the colonoscope                            was passed under direct vision. Throughout the                            procedure, the patient's blood pressure, pulse, and                            oxygen saturations were monitored continuously. The                            Colonoscope was introduced through the anus and                     advanced to the cecum, identified by appendiceal                            orifice and ileocecal valve. The colonoscopy was                            performed without difficulty. The patient tolerated                            the procedure well. The quality of the bowel                            preparation was excellent. The ileocecal valve,                            appendiceal orifice, and rectum were photographed. Scope In: 1:35:40 PM Scope Out: 1:47:43 PM Scope Withdrawal Time: 0 hours 9 minutes 51 seconds  Total Procedure Duration: 0 hours 12 minutes 3 seconds  Findings:                 The digital rectal exam was normal.                           Multiple small and large-mouthed diverticula were                            found in the sigmoid colon and descending colon.                           Internal hemorrhoids were found during                            retroflexion. The hemorrhoids were medium-sized.                           The exam was otherwise without abnormality. Complications:            No immediate complications. Estimated Blood Loss:  Estimated blood loss: none. Impression:               - Diverticulosis in the sigmoid colon and in the                            descending colon.                           - Internal hemorrhoids.                           - The examination was otherwise normal.                           - No specimens collected. Recommendation:           - Patient has a contact number available for                            emergencies. The signs and symptoms of potential                            delayed complications were discussed with the                            patient. Return to normal activities tomorrow.                            Written discharge instructions were provided to the                            patient.                           - Resume previous diet.                           - Continue  present medications.                           - Repeat colonoscopy in 10 years for surveillance. Jerene Bears, MD 06/24/2018 1:50:01 PM This report has been signed electronically.

## 2018-06-24 NOTE — Patient Instructions (Signed)
Please read handouts provided. Continue present medications.     YOU HAD AN ENDOSCOPIC PROCEDURE TODAY AT [facility]:   Refer to the procedure report that was given to you for any specific questions about what was found during the examination.  If the procedure report does not answer your questions, please call your gastroenterologist to clarify.  If you requested that your care partner not be given the details of your procedure findings, then the procedure report has been included in a sealed envelope for you to review at your convenience later.  YOU SHOULD EXPECT: Some feelings of bloating in the abdomen. Passage of more gas than usual.  Walking can help get rid of the air that was put into your GI tract during the procedure and reduce the bloating. If you had a lower endoscopy (such as a colonoscopy or flexible sigmoidoscopy) you may notice spotting of blood in your stool or on the toilet paper. If you underwent a bowel prep for your procedure, you may not have a normal bowel movement for a few days.  Please Note:  You might notice some irritation and congestion in your nose or some drainage.  This is from the oxygen used during your procedure.  There is no need for concern and it should clear up in a day or so.  SYMPTOMS TO REPORT IMMEDIATELY:   Following lower endoscopy (colonoscopy or flexible sigmoidoscopy):  Excessive amounts of blood in the stool  Significant tenderness or worsening of abdominal pains  Swelling of the abdomen that is new, acute  Fever of 100F or higher   For urgent or emergent issues, a gastroenterologist can be reached at any hour by calling [phone].   DIET:  We do recommend a small meal at first, but then you may proceed to your regular diet.  Drink plenty of fluids but you should avoid alcoholic beverages for 24 hours.  ACTIVITY:  You should plan to take it easy for the rest of today and you should NOT DRIVE or use heavy machinery  until tomorrow (because of the sedation medicines used during the test).    FOLLOW UP: Our staff will call the number listed on your records 48-72 hours following your procedure to check on you and address any questions or concerns that you may have regarding the information given to you following your procedure. If we do not reach you, we will leave a message.  We will attempt to reach you two times.  During this call, we will ask if you have developed any symptoms of COVID 19. If you develop any symptoms (ie: fever, flu-like symptoms, shortness of breath, cough etc.) before then, please call [phone].  If you test positive for Covid 19 in the 2 weeks post procedure, please call and report this information to [place].    If any biopsies were taken you will be contacted by phone or by letter within the next 1-3 weeks.  Please call us at [phone] if you have not heard about the biopsies in 3 weeks.    SIGNATURES/CONFIDENTIALITY: You and/or your care partner have signed paperwork which will be entered into your electronic medical record.  These signatures attest to the fact that that the information above on your After Visit Summary has been reviewed and is understood.  Full responsibility of the confidentiality of this discharge information lies with you and/or your care-partner.

## 2018-06-24 NOTE — Progress Notes (Signed)
Report to PACU, RN, vss, BBS= Clear.  

## 2018-06-24 NOTE — Progress Notes (Signed)
Pt's states no medical or surgical changes since previsit or office visit. 

## 2018-06-24 NOTE — Progress Notes (Signed)
Juan Sheer, LPN- [facility] [place], [place]

## 2018-06-26 ENCOUNTER — Telehealth: Payer: Self-pay

## 2018-06-26 NOTE — Telephone Encounter (Signed)
Covid-19 screening questions   Do you now or have you had a fever in the last 14 days? no  Do you have any respiratory symptoms of shortness of breath or cough now or in the last 14 days?no  Do you have any family members or close contacts with diagnosed or suspected Covid-19 in the past 14 days?no Have you been tested for Covid-19 and found to be positive?no      Follow up Call-  Call back number 06/24/2018  Post procedure Call Back phone  # [phone]  Permission to leave phone message Yes  Some recent data might be hidden     Patient questions:  Do you have a fever, pain , or abdominal swelling? No. Pain Score  0 *  Have you tolerated food without any problems? Yes.    Have you been able to return to your normal activities? Yes.    Do you have any questions about your discharge instructions: Diet   No. Medications  No. Follow up visit  No.  Do you have questions or concerns about your Care? No.  Actions: * If pain score is 4 or above: No action needed, pain <4.

## 2018-07-03 ENCOUNTER — Telehealth: Payer: Self-pay | Admitting: Medical Oncology

## 2018-07-03 NOTE — Telephone Encounter (Signed)
Juan Hunt called stating he has decided on robotic prostatectomy as treatment for his prostate cancer. He asked if I could let Dr. Louis Meckel know. I will be happy to share his decision with Dr. Louis Meckel.  He also asked if I could check to see if his name is on the prostate support group list. He did not receive an invite to our June ZOOM meeting. I will follow up and make sure his name is on the list. I encouraged him to call me with questions or concerns. He voiced understanding.

## 2018-07-08 DIAGNOSIS — Z20828 Contact with and (suspected) exposure to other viral communicable diseases: Secondary | ICD-10-CM | POA: Diagnosis not present

## 2018-07-10 ENCOUNTER — Other Ambulatory Visit: Payer: Self-pay | Admitting: Urology

## 2018-07-17 DIAGNOSIS — C61 Malignant neoplasm of prostate: Secondary | ICD-10-CM | POA: Diagnosis not present

## 2018-07-29 DIAGNOSIS — C61 Malignant neoplasm of prostate: Secondary | ICD-10-CM | POA: Diagnosis not present

## 2018-07-29 DIAGNOSIS — M6281 Muscle weakness (generalized): Secondary | ICD-10-CM | POA: Diagnosis not present

## 2018-08-03 ENCOUNTER — Encounter: Payer: Self-pay | Admitting: Family Medicine

## 2018-08-11 DIAGNOSIS — N393 Stress incontinence (female) (male): Secondary | ICD-10-CM | POA: Diagnosis not present

## 2018-08-11 DIAGNOSIS — M6281 Muscle weakness (generalized): Secondary | ICD-10-CM | POA: Diagnosis not present

## 2018-08-11 DIAGNOSIS — M62838 Other muscle spasm: Secondary | ICD-10-CM | POA: Diagnosis not present

## 2018-08-19 DIAGNOSIS — C61 Malignant neoplasm of prostate: Secondary | ICD-10-CM | POA: Diagnosis not present

## 2018-08-27 NOTE — Patient Instructions (Addendum)
YOU NEED TO HAVE A COVID 19 TEST ON_______ @_______ , THIS TEST MUST BE DONE BEFORE SURGERY, COME  [facility], [place] , [address]. ONCE YOUR COVID TEST IS COMPLETED, PLEASE BEGIN THE QUARANTINE INSTRUCTIONS AS OUTLINED IN YOUR HANDOUT.                Juan Hunt    Your procedure is scheduled on: 09-03-2018  Report to [facility] Main  Entrance   Report to [facility] at  530 AM   1 VISITOR IS ALLOWED TO WAIT IN WAITING ROOM  ONLY DAY OF YOUR SURGERY. NO VISITORS ARE ALLOWED IN SHORT STAY OR RECOVERY ROOM.   Call this number if you have problems the morning of surgery [phone]    Remember: Do not eat food or drink liquids :After Midnight. BRUSH YOUR TEETH MORNING OF SURGERY AND RINSE YOUR MOUTH OUT, NO CHEWING GUM CANDY OR MINTS.   FLEETS ENEMA MORNING OF  SURGERY BEFORE YOU LEAVE HOME   Take these medicines the morning of surgery with A SIP OF WATER: lipitor                                You may not have any metal on your body including hair pins and              piercings  Do not wear jewelry, make-up, lotions, powders or perfumes, deodorant                         Men may shave face and neck.   Do not bring valuables to the hospital. [place].  Contacts, dentures or bridgework may not be worn into surgery.             _____________________________________________________________________             [facility] - Preparing for Surgery Before surgery, you can play an important role.  Because skin is not sterile, your skin needs to be as free of germs as possible.  You can reduce the number of germs on your skin by washing with CHG (chlorahexidine gluconate) soap before surgery.  CHG is an antiseptic cleaner which kills germs and bonds with the skin to continue killing germs even after washing. Please DO NOT use if you have an allergy to CHG or antibacterial soaps.  If your skin becomes  reddened/irritated stop using the CHG and inform your nurse when you arrive at Short Stay. Do not shave (including legs and underarms) for at least 48 hours prior to the first CHG shower.  You may shave your face/neck. Please follow these instructions carefully:  1.  Shower with CHG Soap the night before surgery and the  morning of Surgery.  2.  If you choose to wash your hair, wash your hair first as usual with your  normal  shampoo.  3.  After you shampoo, rinse your hair and body thoroughly to remove the  shampoo.                           4.  Use CHG as you would any other liquid soap.  You can apply chg directly  to the skin and wash  Gently with a scrungie or clean washcloth.  5.  Apply the CHG Soap to your body ONLY FROM THE NECK DOWN.   Do not use on face/ open                           Wound or open sores. Avoid contact with eyes, ears mouth and genitals (private parts).                       Wash face,  Genitals (private parts) with your normal soap.             6.  Wash thoroughly, paying special attention to the area where your surgery  will be performed.  7.  Thoroughly rinse your body with warm water from the neck down.  8.  DO NOT shower/wash with your normal soap after using and rinsing off  the CHG Soap.                9.  Pat yourself dry with a clean towel.            10.  Wear clean pajamas.            11.  Place clean sheets on your bed the night of your first shower and do not  sleep with pets. Day of Surgery : Do not apply any lotions/deodorants the morning of surgery.  Please wear clean clothes to the hospital/surgery center.  FAILURE TO FOLLOW THESE INSTRUCTIONS MAY RESULT IN THE CANCELLATION OF YOUR SURGERY PATIENT SIGNATURE_________________________________  NURSE SIGNATURE__________________________________  ________________________________________________________________________  WHAT IS A BLOOD TRANSFUSION? Blood Transfusion Information  A  transfusion is the replacement of blood or some of its parts. Blood is made up of multiple cells which provide different functions.  Red blood cells carry oxygen and are used for blood loss replacement.  White blood cells fight against infection.  Platelets control bleeding.  Plasma helps clot blood.  Other blood products are available for specialized needs, such as hemophilia or other clotting disorders. BEFORE THE TRANSFUSION  Who gives blood for transfusions?   Healthy volunteers who are fully evaluated to make sure their blood is safe. This is blood bank blood. Transfusion therapy is the safest it has ever been in the practice of medicine. Before blood is taken from a donor, a complete history is taken to make sure that person has no history of diseases nor engages in risky social behavior (examples are intravenous drug use or sexual activity with multiple partners). The donor's travel history is screened to minimize risk of transmitting infections, such as malaria. The donated blood is tested for signs of infectious diseases, such as HIV and hepatitis. The blood is then tested to be sure it is compatible with you in order to minimize the chance of a transfusion reaction. If you or a relative donates blood, this is often done in anticipation of surgery and is not appropriate for emergency situations. It takes many days to process the donated blood. RISKS AND COMPLICATIONS Although transfusion therapy is very safe and saves many lives, the main dangers of transfusion include:   Getting an infectious disease.  Developing a transfusion reaction. This is an allergic reaction to something in the blood you were given. Every precaution is taken to prevent this. The decision to have a blood transfusion has been considered carefully by your caregiver before blood is given. Blood is not given unless the benefits outweigh  the risks. AFTER THE TRANSFUSION  Right after receiving a blood transfusion,  you will usually feel much better and more energetic. This is especially true if your red blood cells have gotten low (anemic). The transfusion raises the level of the red blood cells which carry oxygen, and this usually causes an energy increase.  The nurse administering the transfusion will monitor you carefully for complications. HOME CARE INSTRUCTIONS  No special instructions are needed after a transfusion. You may find your energy is better. Speak with your caregiver about any limitations on activity for underlying diseases you may have. SEEK MEDICAL CARE IF:   Your condition is not improving after your transfusion.  You develop redness or irritation at the intravenous (IV) site. SEEK IMMEDIATE MEDICAL CARE IF:  Any of the following symptoms occur over the next 12 hours:  Shaking chills.  You have a temperature by mouth above 102 F (38.9 C), not controlled by medicine.  Chest, back, or muscle pain.  People around you feel you are not acting correctly or are confused.  Shortness of breath or difficulty breathing.  Dizziness and fainting.  You get a rash or develop hives.  You have a decrease in urine output.  Your urine turns a dark color or changes to pink, red, or brown. Any of the following symptoms occur over the next 10 days:  You have a temperature by mouth above 102 F (38.9 C), not controlled by medicine.  Shortness of breath.  Weakness after normal activity.  The white part of the eye turns yellow (jaundice).  You have a decrease in the amount of urine or are urinating less often.  Your urine turns a dark color or changes to pink, red, or brown. Document Released: 12/23/1999 Document Revised: 03/19/2011 Document Reviewed: 08/11/2007 [facility] Patient Information 2014 [place], [place].  _______________________________________________________________________

## 2018-08-28 ENCOUNTER — Other Ambulatory Visit: Payer: Self-pay

## 2018-08-28 ENCOUNTER — Encounter (HOSPITAL_COMMUNITY)
Admission: RE | Admit: 2018-08-28 | Discharge: 2018-08-28 | Disposition: A | Discharge status: Home / Self Care | Payer: PPO | Source: Ambulatory Visit | Attending: Urology | Admitting: Urology

## 2018-08-28 ENCOUNTER — Encounter (HOSPITAL_COMMUNITY): Payer: Self-pay

## 2018-08-28 DIAGNOSIS — C61 Malignant neoplasm of prostate: Secondary | ICD-10-CM | POA: Diagnosis not present

## 2018-08-28 DIAGNOSIS — Z20828 Contact with and (suspected) exposure to other viral communicable diseases: Secondary | ICD-10-CM | POA: Diagnosis not present

## 2018-08-28 DIAGNOSIS — Z01812 Encounter for preprocedural laboratory examination: Secondary | ICD-10-CM | POA: Diagnosis not present

## 2018-08-28 LAB — COMPREHENSIVE METABOLIC PANEL
ALT: 27 U/L (ref 0–44)
AST: 24 U/L (ref 15–41)
Albumin: 4.2 g/dL (ref 3.5–5.0)
Alkaline Phosphatase: 64 U/L (ref 38–126)
Anion gap: 7 (ref 5–15)
BUN: 16 mg/dL (ref 8–23)
CO2: 27 mmol/L (ref 22–32)
Calcium: 9.3 mg/dL (ref 8.9–10.3)
Chloride: 105 mmol/L (ref 98–111)
Creatinine, Ser: 1.06 mg/dL (ref 0.61–1.24)
GFR calc Af Amer: >60 mL/min (ref >60)
GFR calc non Af Amer: >60 mL/min (ref >60)
Glucose, Bld: 98 mg/dL (ref 70–99)
Potassium: 4.5 mmol/L (ref 3.5–5.1)
Sodium: 139 mmol/L (ref 135–145)
Total Bilirubin: 0.7 mg/dL (ref 0.3–1.2)
Total Protein: 7.9 g/dL (ref 6.5–8.1)

## 2018-08-28 LAB — CBC
HCT: 47 % (ref 39.0–52.0)
Hemoglobin: 14.6 g/dL (ref 13.0–17.0)
MCH: 26.9 pg (ref 26.0–34.0)
MCHC: 31.1 g/dL (ref 30.0–36.0)
MCV: 86.6 fL (ref 80.0–100.0)
Platelets: 180 10*3/uL (ref 150–400)
RBC: 5.43 MIL/uL (ref 4.22–5.81)
RDW: 14.5 % (ref 11.5–15.5)
WBC: 4.4 10*3/uL (ref 4.0–10.5)
nRBC: 0 % (ref 0.0–0.2)

## 2018-08-28 LAB — HEMOGLOBIN A1C
Hgb A1c MFr Bld: 6 % — ABNORMAL HIGH (ref 4.8–5.6)
Mean Plasma Glucose: 125.5 mg/dL

## 2018-08-29 LAB — URINE CULTURE: Culture: <10000 — AB

## 2018-08-29 LAB — ABO/RH: ABO/RH(D): B POS

## 2018-08-30 ENCOUNTER — Other Ambulatory Visit (HOSPITAL_COMMUNITY)
Admission: RE | Admit: 2018-08-30 | Discharge: 2018-08-30 | Disposition: A | Discharge status: Home / Self Care | Payer: PPO | Source: Ambulatory Visit | Attending: Urology | Admitting: Urology

## 2018-08-30 DIAGNOSIS — Z01812 Encounter for preprocedural laboratory examination: Secondary | ICD-10-CM | POA: Diagnosis not present

## 2018-08-30 LAB — SARS CORONAVIRUS 2 (TAT 6-24 HRS): SARS Coronavirus 2: NEGATIVE

## 2018-09-02 NOTE — Anesthesia Preprocedure Evaluation (Signed)
Anesthesia Evaluation  Patient identified by MRN, date of birth, ID band Patient awake    Reviewed: Allergy & Precautions, H&P , NPO status , Patient's Chart, lab work & pertinent test results  Airway Mallampati: II   Neck ROM: full    Dental  (+) Dental Advisory Given   Pulmonary former smoker,    breath sounds clear to auscultation       Cardiovascular negative cardio ROS   Rhythm:regular Rate:Normal     Neuro/Psych    GI/Hepatic GERD  ,  Endo/Other    Renal/GU Renal disease     Musculoskeletal  (+) Arthritis ,   Abdominal   Peds  Hematology   Anesthesia Other Findings   Reproductive/Obstetrics                             Anesthesia Physical  Anesthesia Plan  ASA: III  Anesthesia Plan: General   Post-op Pain Management:    Induction: Intravenous  PONV Risk Score and Plan: 4 or greater and Ondansetron, Dexamethasone, Propofol, Midazolam and Treatment may vary due to age or medical condition  Airway Management Planned: Oral ETT  Additional Equipment: None  Intra-op Plan:   Post-operative Plan: Extubation in OR  Informed Consent: I have reviewed the patients History and Physical, chart, labs and discussed the procedure including the risks, benefits and alternatives for the proposed anesthesia with the patient or authorized representative who has indicated his/her understanding and acceptance.     Dental advisory given  Plan Discussed with: CRNA  Anesthesia Plan Comments:         Anesthesia Quick Evaluation

## 2018-09-03 ENCOUNTER — Other Ambulatory Visit: Payer: Self-pay

## 2018-09-03 ENCOUNTER — Observation Stay (HOSPITAL_COMMUNITY)
Admission: RE | Admit: 2018-09-03 | Discharge: 2018-09-04 | Disposition: A | Discharge status: Home / Self Care | Payer: PPO | Attending: Urology | Admitting: Urology

## 2018-09-03 ENCOUNTER — Encounter (HOSPITAL_COMMUNITY): Payer: Self-pay | Admitting: Emergency Medicine

## 2018-09-03 ENCOUNTER — Ambulatory Visit (HOSPITAL_COMMUNITY): Payer: PPO | Admitting: Certified Registered"

## 2018-09-03 ENCOUNTER — Ambulatory Visit (HOSPITAL_COMMUNITY): Payer: PPO | Admitting: Physician Assistant

## 2018-09-03 ENCOUNTER — Encounter (HOSPITAL_COMMUNITY)
Admission: RE | Disposition: A | Discharge status: Home / Self Care | Payer: Self-pay | Source: Home / Self Care | Attending: Urology

## 2018-09-03 DIAGNOSIS — M1990 Unspecified osteoarthritis, unspecified site: Secondary | ICD-10-CM | POA: Diagnosis not present

## 2018-09-03 DIAGNOSIS — N401 Enlarged prostate with lower urinary tract symptoms: Secondary | ICD-10-CM | POA: Diagnosis not present

## 2018-09-03 DIAGNOSIS — R7303 Prediabetes: Secondary | ICD-10-CM | POA: Diagnosis not present

## 2018-09-03 DIAGNOSIS — C61 Malignant neoplasm of prostate: Principal | ICD-10-CM | POA: Insufficient documentation

## 2018-09-03 DIAGNOSIS — N5201 Erectile dysfunction due to arterial insufficiency: Secondary | ICD-10-CM | POA: Insufficient documentation

## 2018-09-03 DIAGNOSIS — Z87891 Personal history of nicotine dependence: Secondary | ICD-10-CM | POA: Diagnosis not present

## 2018-09-03 DIAGNOSIS — K219 Gastro-esophageal reflux disease without esophagitis: Secondary | ICD-10-CM | POA: Diagnosis not present

## 2018-09-03 DIAGNOSIS — N138 Other obstructive and reflux uropathy: Secondary | ICD-10-CM | POA: Diagnosis not present

## 2018-09-03 HISTORY — PX: ROBOT ASSISTED LAPAROSCOPIC RADICAL PROSTATECTOMY: SHX5141

## 2018-09-03 HISTORY — PX: PELVIC LYMPH NODE DISSECTION: SHX6543

## 2018-09-03 LAB — TYPE AND SCREEN
ABO/RH(D): B POS
Antibody Screen: NEGATIVE

## 2018-09-03 LAB — HEMOGLOBIN AND HEMATOCRIT, BLOOD
HCT: 45.3 % (ref 39.0–52.0)
Hemoglobin: 14 g/dL (ref 13.0–17.0)

## 2018-09-03 SURGERY — PROSTATECTOMY, RADICAL, ROBOT-ASSISTED, LAPAROSCOPIC
Anesthesia: General

## 2018-09-03 MED ORDER — ONDANSETRON HCL 4 MG/2ML IJ SOLN: 4.0000 mg | INTRAMUSCULAR | Status: DC | PRN

## 2018-09-03 MED ORDER — DEXTROSE-NACL 5-0.45 % IV SOLN
INTRAVENOUS | Status: DC
  Administered 2018-09-03 (×2): via INTRAVENOUS

## 2018-09-03 MED ORDER — ACETAMINOPHEN 325 MG PO TABS: 650.0000 mg | ORAL_TABLET | ORAL | Status: DC | PRN

## 2018-09-03 MED ORDER — SODIUM CHLORIDE (PF) 0.9 % IJ SOLN
INTRAMUSCULAR | Status: DC | PRN
  Administered 2018-09-03: 20 mL

## 2018-09-03 MED ORDER — LACTATED RINGERS IV SOLN
INTRAVENOUS | Status: DC | PRN
  Administered 2018-09-03: 07:00:00 via INTRAVENOUS

## 2018-09-03 MED ORDER — ONDANSETRON HCL 4 MG/2ML IJ SOLN: 4.0000 mg | Freq: Once | INTRAMUSCULAR | Status: DC | PRN

## 2018-09-03 MED ORDER — BELLADONNA ALKALOIDS-OPIUM 16.2-60 MG RE SUPP: 1.0000 | Freq: Four times a day (QID) | RECTAL | Status: DC | PRN

## 2018-09-03 MED ORDER — SODIUM CHLORIDE 0.9 % IV BOLUS
1000.0000 mL | Freq: Once | INTRAVENOUS | Status: AC
  Administered 2018-09-03: 1000 mL via INTRAVENOUS

## 2018-09-03 MED ORDER — SULFAMETHOXAZOLE-TRIMETHOPRIM 800-160 MG PO TABS: 1.0000 | ORAL_TABLET | Freq: Two times a day (BID) | ORAL | 0 refills | Status: DC

## 2018-09-03 MED ORDER — HYDROMORPHONE HCL 1 MG/ML IJ SOLN
0.2500 mg | INTRAMUSCULAR | Status: DC | PRN
  Administered 2018-09-03 (×4): 0.5 mg via INTRAVENOUS

## 2018-09-03 MED ORDER — TRAMADOL HCL 50 MG PO TABS
50.0000 mg | ORAL_TABLET | Freq: Four times a day (QID) | ORAL | Status: DC | PRN
  Administered 2018-09-03 – 2018-09-04 (×2): 50 mg via ORAL
  Filled 2018-09-03 (×2): qty 1

## 2018-09-03 MED ORDER — LACTATED RINGERS IV SOLN
INTRAVENOUS | Status: DC
  Administered 2018-09-03: 06:00:00 via INTRAVENOUS

## 2018-09-03 MED ORDER — MEPERIDINE HCL 50 MG/ML IJ SOLN: 6.2500 mg | INTRAMUSCULAR | Status: DC | PRN

## 2018-09-03 MED ORDER — CHLORHEXIDINE GLUCONATE 4 % EX LIQD: Freq: Once | CUTANEOUS | Status: DC

## 2018-09-03 MED ORDER — TRAMADOL HCL 50 MG PO TABS: 50.0000 mg | ORAL_TABLET | Freq: Four times a day (QID) | ORAL | 0 refills | Status: DC | PRN

## 2018-09-03 MED ORDER — DIPHENHYDRAMINE HCL 12.5 MG/5ML PO ELIX: 12.5000 mg | ORAL_SOLUTION | Freq: Four times a day (QID) | ORAL | Status: DC | PRN

## 2018-09-03 MED ORDER — FENTANYL CITRATE (PF) 250 MCG/5ML IJ SOLN
INTRAMUSCULAR | Status: AC
  Filled 2018-09-03: qty 5

## 2018-09-03 MED ORDER — MIDAZOLAM HCL 2 MG/2ML IJ SOLN
INTRAMUSCULAR | Status: DC | PRN
  Administered 2018-09-03 (×2): 1 mg via INTRAVENOUS

## 2018-09-03 MED ORDER — CEFAZOLIN SODIUM-DEXTROSE 2-4 GM/100ML-% IV SOLN
2.0000 g | INTRAVENOUS | Status: AC
  Administered 2018-09-03: 2 g via INTRAVENOUS
  Filled 2018-09-03: qty 100

## 2018-09-03 MED ORDER — BUPIVACAINE-EPINEPHRINE 0.5% -1:200000 IJ SOLN
INTRAMUSCULAR | Status: DC | PRN
  Administered 2018-09-03: 10 mL

## 2018-09-03 MED ORDER — ROCURONIUM BROMIDE 10 MG/ML (PF) SYRINGE
PREFILLED_SYRINGE | INTRAVENOUS | Status: DC | PRN
  Administered 2018-09-03: 20 mg via INTRAVENOUS
  Administered 2018-09-03: 60 mg via INTRAVENOUS
  Administered 2018-09-03 (×6): 10 mg via INTRAVENOUS

## 2018-09-03 MED ORDER — SODIUM CHLORIDE (PF) 0.9 % IJ SOLN
INTRAMUSCULAR | Status: AC
  Filled 2018-09-03: qty 20

## 2018-09-03 MED ORDER — EPHEDRINE SULFATE-NACL 50-0.9 MG/10ML-% IV SOSY
PREFILLED_SYRINGE | INTRAVENOUS | Status: DC | PRN
  Administered 2018-09-03: 10 mg via INTRAVENOUS
  Administered 2018-09-03: 5 mg via INTRAVENOUS
  Administered 2018-09-03: 15 mg via INTRAVENOUS

## 2018-09-03 MED ORDER — HYDROMORPHONE HCL 1 MG/ML IJ SOLN
INTRAMUSCULAR | Status: AC
  Filled 2018-09-03: qty 1

## 2018-09-03 MED ORDER — DIPHENHYDRAMINE HCL 50 MG/ML IJ SOLN: 12.5000 mg | Freq: Four times a day (QID) | INTRAMUSCULAR | Status: DC | PRN

## 2018-09-03 MED ORDER — ONDANSETRON HCL 4 MG/2ML IJ SOLN
INTRAMUSCULAR | Status: AC
  Filled 2018-09-03: qty 2

## 2018-09-03 MED ORDER — BUPIVACAINE LIPOSOME 1.3 % IJ SUSP
20.0000 mL | Freq: Once | INTRAMUSCULAR | Status: AC
  Administered 2018-09-03: 12:00:00 20 mL
  Filled 2018-09-03: qty 20

## 2018-09-03 MED ORDER — GLYCOPYRROLATE 0.2 MG/ML IJ SOLN
INTRAMUSCULAR | Status: DC | PRN
  Administered 2018-09-03: 0.2 mg via INTRAVENOUS

## 2018-09-03 MED ORDER — BACITRACIN-NEOMYCIN-POLYMYXIN 400-5-5000 EX OINT: 1.0000 "application " | TOPICAL_OINTMENT | Freq: Three times a day (TID) | CUTANEOUS | Status: DC | PRN

## 2018-09-03 MED ORDER — HYDROMORPHONE HCL 1 MG/ML IJ SOLN: 0.5000 mg | INTRAMUSCULAR | Status: DC | PRN

## 2018-09-03 MED ORDER — ATORVASTATIN CALCIUM 40 MG PO TABS
40.0000 mg | ORAL_TABLET | Freq: Every day | ORAL | Status: DC
  Administered 2018-09-03 – 2018-09-04 (×2): 40 mg via ORAL
  Filled 2018-09-03 (×2): qty 1

## 2018-09-03 MED ORDER — ONDANSETRON HCL 4 MG/2ML IJ SOLN
INTRAMUSCULAR | Status: DC | PRN
  Administered 2018-09-03: 4 mg via INTRAVENOUS

## 2018-09-03 MED ORDER — DEXAMETHASONE SODIUM PHOSPHATE 10 MG/ML IJ SOLN
INTRAMUSCULAR | Status: AC
  Filled 2018-09-03: qty 1

## 2018-09-03 MED ORDER — ROCURONIUM BROMIDE 10 MG/ML (PF) SYRINGE
PREFILLED_SYRINGE | INTRAVENOUS | Status: AC
  Filled 2018-09-03: qty 10

## 2018-09-03 MED ORDER — LACTATED RINGERS IR SOLN
Status: DC | PRN
  Administered 2018-09-03: 1000 mL

## 2018-09-03 MED ORDER — MIDAZOLAM HCL 2 MG/2ML IJ SOLN
INTRAMUSCULAR | Status: AC
  Filled 2018-09-03: qty 2

## 2018-09-03 MED ORDER — FLEET ENEMA 7-19 GM/118ML RE ENEM
1.0000 | ENEMA | Freq: Once | RECTAL | Status: DC
  Filled 2018-09-03: qty 1

## 2018-09-03 MED ORDER — LIDOCAINE 2% (20 MG/ML) 5 ML SYRINGE
INTRAMUSCULAR | Status: DC | PRN
  Administered 2018-09-03: 60 mg via INTRAVENOUS

## 2018-09-03 MED ORDER — FENTANYL CITRATE (PF) 250 MCG/5ML IJ SOLN
INTRAMUSCULAR | Status: DC | PRN
  Administered 2018-09-03: 50 ug via INTRAVENOUS
  Administered 2018-09-03 (×2): 25 ug via INTRAVENOUS
  Administered 2018-09-03: 50 ug via INTRAVENOUS
  Administered 2018-09-03: 100 ug via INTRAVENOUS
  Administered 2018-09-03: 50 ug via INTRAVENOUS

## 2018-09-03 MED ORDER — PROPOFOL 10 MG/ML IV BOLUS
INTRAVENOUS | Status: AC
  Filled 2018-09-03: qty 40

## 2018-09-03 MED ORDER — LIDOCAINE 2% (20 MG/ML) 5 ML SYRINGE
INTRAMUSCULAR | Status: AC
  Filled 2018-09-03: qty 5

## 2018-09-03 MED ORDER — SUGAMMADEX SODIUM 200 MG/2ML IV SOLN
INTRAVENOUS | Status: DC | PRN
  Administered 2018-09-03: 200 mg via INTRAVENOUS

## 2018-09-03 MED ORDER — FENTANYL CITRATE (PF) 100 MCG/2ML IJ SOLN
INTRAMUSCULAR | Status: AC
  Filled 2018-09-03: qty 2

## 2018-09-03 MED ORDER — GLYCOPYRROLATE PF 0.2 MG/ML IJ SOSY
PREFILLED_SYRINGE | INTRAMUSCULAR | Status: AC
  Filled 2018-09-03: qty 1

## 2018-09-03 MED ORDER — PROPOFOL 10 MG/ML IV BOLUS
INTRAVENOUS | Status: DC | PRN
  Administered 2018-09-03: 160 mg via INTRAVENOUS

## 2018-09-03 MED ORDER — DEXAMETHASONE SODIUM PHOSPHATE 10 MG/ML IJ SOLN
INTRAMUSCULAR | Status: DC | PRN
  Administered 2018-09-03: 8 mg via INTRAVENOUS

## 2018-09-03 MED ORDER — ACETAMINOPHEN 10 MG/ML IV SOLN
1000.0000 mg | Freq: Four times a day (QID) | INTRAVENOUS | Status: AC
  Administered 2018-09-03 – 2018-09-04 (×4): 1000 mg via INTRAVENOUS
  Filled 2018-09-03 (×4): qty 100

## 2018-09-03 MED ORDER — EPHEDRINE 5 MG/ML INJ
INTRAVENOUS | Status: AC
  Filled 2018-09-03: qty 20

## 2018-09-03 MED ORDER — DOCUSATE SODIUM 100 MG PO CAPS
100.0000 mg | ORAL_CAPSULE | Freq: Two times a day (BID) | ORAL | Status: DC
  Administered 2018-09-03 – 2018-09-04 (×2): 100 mg via ORAL
  Filled 2018-09-03 (×2): qty 1

## 2018-09-03 MED ORDER — BUPIVACAINE-EPINEPHRINE 0.5% -1:200000 IJ SOLN
INTRAMUSCULAR | Status: AC
  Filled 2018-09-03: qty 1

## 2018-09-03 MED ORDER — KETOROLAC TROMETHAMINE 15 MG/ML IJ SOLN
15.0000 mg | Freq: Four times a day (QID) | INTRAMUSCULAR | Status: DC
  Administered 2018-09-03 – 2018-09-04 (×4): 15 mg via INTRAVENOUS
  Filled 2018-09-03 (×4): qty 1

## 2018-09-03 SURGICAL SUPPLY — 57 items
CATH FOLEY 2WAY SLVR 18FR 30CC (CATHETERS) ×4 IMPLANT
CATH TIEMANN FOLEY 18FR 5CC (CATHETERS) ×4 IMPLANT
CHLORAPREP W/TINT 26 (MISCELLANEOUS) ×4 IMPLANT
CLIP VESOLOCK LG 6/CT PURPLE (CLIP) ×8 IMPLANT
COVER SURGICAL LIGHT HANDLE (MISCELLANEOUS) ×4 IMPLANT
COVER TIP SHEARS 8 DVNC (MISCELLANEOUS) ×2 IMPLANT
COVER TIP SHEARS 8MM DA VINCI (MISCELLANEOUS) ×2
COVER WAND RF STERILE (DRAPES) IMPLANT
CUTTER ECHEON FLEX ENDO 45 340 (ENDOMECHANICALS) ×4 IMPLANT
DECANTER SPIKE VIAL GLASS SM (MISCELLANEOUS) ×4 IMPLANT
DERMABOND ADVANCED (GAUZE/BANDAGES/DRESSINGS) ×2
DERMABOND ADVANCED .7 DNX12 (GAUZE/BANDAGES/DRESSINGS) ×2 IMPLANT
DRAIN CHANNEL RND F F (WOUND CARE) ×4 IMPLANT
DRAPE ARM DVNC X/XI (DISPOSABLE) ×8 IMPLANT
DRAPE COLUMN DVNC XI (DISPOSABLE) ×2 IMPLANT
DRAPE DA VINCI XI ARM (DISPOSABLE) ×8
DRAPE DA VINCI XI COLUMN (DISPOSABLE) ×2
DRAPE SURG IRRIG POUCH 19X23 (DRAPES) ×4 IMPLANT
DRSG TEGADERM 4X4.75 (GAUZE/BANDAGES/DRESSINGS) ×4 IMPLANT
ELECT PENCIL ROCKER SW 15FT (MISCELLANEOUS) ×4 IMPLANT
ELECT REM PT RETURN 15FT ADLT (MISCELLANEOUS) ×4 IMPLANT
GAUZE 4X4 16PLY RFD (DISPOSABLE) IMPLANT
GLOVE BIO SURGEON STRL SZ 6.5 (GLOVE) ×3 IMPLANT
GLOVE BIO SURGEONS STRL SZ 6.5 (GLOVE) ×1
GLOVE BIOGEL M STRL SZ7.5 (GLOVE) ×8 IMPLANT
GOWN STRL REUS W/TWL LRG LVL3 (GOWN DISPOSABLE) ×16 IMPLANT
GOWN STRL REUS W/TWL XL LVL3 (GOWN DISPOSABLE) ×8 IMPLANT
HOLDER FOLEY CATH W/STRAP (MISCELLANEOUS) ×4 IMPLANT
IRRIG SUCT STRYKERFLOW 2 WTIP (MISCELLANEOUS) ×4
IRRIGATION SUCT STRKRFLW 2 WTP (MISCELLANEOUS) ×2 IMPLANT
KIT TURNOVER KIT A (KITS) IMPLANT
PACK ROBOT UROLOGY CUSTOM (CUSTOM PROCEDURE TRAY) ×4 IMPLANT
PAD POSITIONING PINK XL (MISCELLANEOUS) ×4 IMPLANT
PORT ACCESS TROCAR AIRSEAL 12 (TROCAR) ×2 IMPLANT
PORT ACCESS TROCAR AIRSEAL 5M (TROCAR) ×2
SEAL CANN UNIV 5-8 DVNC XI (MISCELLANEOUS) ×8 IMPLANT
SEAL XI 5MM-8MM UNIVERSAL (MISCELLANEOUS) ×8
SET TRI-LUMEN FLTR TB AIRSEAL (TUBING) ×4 IMPLANT
SOLUTION ELECTROLUBE (MISCELLANEOUS) ×4 IMPLANT
STAPLE RELOAD 45 GRN (STAPLE) ×2 IMPLANT
STAPLE RELOAD 45MM GREEN (STAPLE) ×2
SUT ETHILON 3 0 PS 1 (SUTURE) ×4 IMPLANT
SUT MNCRL AB 4-0 PS2 18 (SUTURE) ×8 IMPLANT
SUT NOVA NAB DX-16 0-1 5-0 T12 (SUTURE) ×8 IMPLANT
SUT V-LOC BARB 180 2/0GR6 GS22 (SUTURE) ×4
SUT VIC AB 0 CT1 27 (SUTURE) ×2
SUT VIC AB 0 CT1 27XBRD ANTBC (SUTURE) ×2 IMPLANT
SUT VIC AB 2-0 SH 27 (SUTURE) ×2
SUT VIC AB 2-0 SH 27X BRD (SUTURE) ×2 IMPLANT
SUT VIC AB 3-0 SH 27 (SUTURE) ×2
SUT VIC AB 3-0 SH 27XBRD (SUTURE) ×2 IMPLANT
SUT VICRYL 0 UR6 27IN ABS (SUTURE) ×8 IMPLANT
SUT VLOC BARB 180 ABS3/0GR12 (SUTURE) ×8
SUTURE V-LC BRB 180 2/0GR6GS22 (SUTURE) ×2 IMPLANT
SUTURE VLOC BRB 180 ABS3/0GR12 (SUTURE) ×4 IMPLANT
TOWEL OR NON WOVEN STRL DISP B (DISPOSABLE) ×4 IMPLANT
WATER STERILE IRR 1000ML POUR (IV SOLUTION) ×4 IMPLANT

## 2018-09-03 NOTE — Op Note (Signed)
Preoperative diagnosis:  1. Prostate Cancer   Postoperative diagnosis:  1. same   Procedure: 1. Robotic assisted laparoscopic radical prostatectomy 2. Bilateral pelvic lymph node dissection 3. Umbilical hernia repair  Surgeon: [name], MD First Assistant: Debbrah Alar, PA Resident surgeon: Arty Baumgartner, MD  Anesthesia: General  Complications: None  Intraoperative findings: small umbilical hernia repaired primarily, tight vesico-urethral anastamosis  EBL: 284mL  Specimens:  #1.  Prostate and seminal vesicals #2.  Bilateral pelvic lymph nodes  Indication: Juan Hunt is a 68 y.o. patient with prostate cancer.  After reviewing the management options for treatment, he elected to proceed with the removal of his prostate. We have discussed the potential benefits and risks of the procedure, side effects of the proposed treatment, the likelihood of the patient achieving the goals of the procedure, and any potential problems that might occur during the procedure or recuperation. Informed consent has been obtained.  Description of procedure:  The patient was consented in the preoperative holding area. He was in brought back to the operating room placed the table in supine position. General anesthesia was then induced and endotracheal tube was inserted. He was then placed in dorsolithotomy position and placed in steep Trendelenburg. He was then prepped and draped in the routine sterile fashion. We, the first assistant and I, then began by making a 10 mm incision supraumbilical midline incision the skin down through into the peritoneum. Then placed a 8 mm trocar. I then inflated the abdomen and inserted the 0 robotic lens. We then placed 2 additional a 8 millimeter trochars in the patient's left lower abdomen proximally 9 cm apart and 2 trochars on the patient's right lower abdomen, one was an 8 mm trocar and the one most lateral was a 12 mm trocar which was used as the  assistant port. A 5 mm trocar was placed by triangulating the 2 right lateral ports as a second assistant port. These ports were all placed under visual guidance. Once the ports were noted to be satisfactory position the robot was docked. We started with the 0 lens, monopolar scissors in the right hand and the [place] forceps the left hand as well as a fenestrated grasper as the third arm on the left-hand side.   We, the first assistant and I,  began our dissection the posterior plane incising the peritoneum at the level of the vas deferens. Isolated the left vas deferens and dissected it proximally towards the spermatic cord for 5 cm prior to ligating it. Then used this as traction to isolate the left the seminal vesicle which was then undressed bluntly and completely dissected out, all vessels were cauterized with a combination of bipolar and the monopolar scissors. We then turned our attention to the right side and similarly dissected out the right vas deferens and seminal vesicle. Once the SVs had been freed, we turned our attention to the posterior plane and bluntly dissected the tissue between the rectum and the posterior wall of the prostate bluntly out towards the apex.    At this point the bladder was taken down starting at the urachal remnant with a combination of both blunt dissection and sharp dissection using monopolar cautery the bladder was dropped down in the usual fashion to the medial umbilical ligaments laterally and the dorsal vein of the prostate anteriorly creating our space of Retzius. We then turned our attention to the endopelvic fascia which was incised laterally starting on the patient's right-hand side the levator muscles were pushed  off the prostate laterally up towards the dorsal vein complex on the right-hand side. This process was then repeated on the left-hand side and a nice notch was created for the dorsal vein. I then used a 31mm stapler to staple the dorsal vein.   We,the  first assistant and I, then located the bladder neck at the vesicoprostatic junction and using the monopolar scissors dissected down through the perivesical tissues and the bladder neck down to the prostatic urethra. The catheter was then deflated and pulled through our urethral opening and then used to retract the prostate anteriorly for the posterior bladder neck dissection. Once through the bladder neck and into the posterior plane of the prostate, the SVs were brought through the opening. The left pedicle was then isolated and systematically ligated with Weck clips and scissors. The nerve bundle was then peeled off the posterior lateral aspect of the prostate and bluntly dissected away off the prostate. The right side was not spared but the pedical taken widely to include the neurovascular bundle.  I then came down through the dorsal venous complex anteriorly down to the membranous urethra using the monopolar. Once down to the urethra, it was transected sharply and the apex of the prostate was then dissected off the levator and rectourethralis muscles. Once the apex of the prostate had been dissected free we came back to the base of the prostate and bluntly push the rectum and nerve vascular bundle off the prostate the patient's left and used clips on the patient's right to free the prostate. Once the prostate was free it was placed off to the side. The pelvis was then irrigated with normal saline and noted to be relatively hemostatic.  Attention was then turned to the right pelvic sidewall. The fibrofatty tissue between the external iliac vein, confluence of the iliac vessels, hypogastric artery, and Cooper's ligament was dissected free from the pelvic sidewall with care to preserve the obturator nerve. Weck clips were used for lymphostasis and hemostasis. An identical procedure was performed on the contralateral side and the lymphatic packets were removed for permanent pathologic analysis.  The prostate  and both lymph node tissues were placed in the Endo Catch bag and the string brought to the 5 mm port.    The vesicourethral anastomosis was then completed with 2 interlocking 3-0 V. lock sutures running the anastomosis in the 6:00 position to the 12:00 position on each side and then tying it off on the top. The final catheter was then passed through the patient's urethra and into the bladder and 120 cc was instilled into the bladder to test the anastomosis. As there was no leak a 4 [place] Blake drain was passed through the left lateral port and placed around the vesicourethral anastomosis. A 12 mm assistant port on the right lateral side was then closed with 0 Vicryl with the help of the [employer] needle. The 12 mm midline infraumbilical incision was then extended another centimeter taken down and the fascia opened to remove the Endo Catch bag with the prostate specimen.   The incision was extended inferiorly into the fascial defect of the umbilical hernia and the hernia sac excised.  The edges of the fascia were then desected out and closed with interrupted novofil sutures. All skin ports were closed with 4-0 Monocryl in a subcutaneous fashion. Dermabond glue was then applied to the incisions. The drain was then secured to the skin with a 0 nylon stitch and dressing applied.   At the end  of the case all laps needles and sponges had been accounted for. There no immediate complications. The patient returned to the PACU in stable condition.

## 2018-09-03 NOTE — Progress Notes (Signed)
Received patient from PACU by stretcher. Patient, drowsy but arousable. Oriented x4, moved over to bed by himself. Took a couple sips of water and went back to sleep.

## 2018-09-03 NOTE — Interval H&P Note (Signed)
History and Physical Interval Note:  09/03/2018 6:41 AM  Juan Hunt  has presented today for surgery, with the diagnosis of PROSTAT ECANCER.  The various methods of treatment have been discussed with the patient and family. After consideration of risks, benefits and other options for treatment, the patient has consented to  Procedure(s): XI ROBOTIC ASSISTED LAPAROSCOPIC RADICAL PROSTATECTOMY (N/A) PELVIC LYMPH NODE DISSECTION (Bilateral) as a surgical intervention.  The patient's history has been reviewed, patient examined, no change in status, stable for surgery.  I have reviewed the patient's chart and labs.  Questions were answered to the patient's satisfaction.     [name]

## 2018-09-03 NOTE — Anesthesia Procedure Notes (Signed)
Procedure Name: Intubation Date/Time: 09/03/2018 7:32 AM Performed by: Eben Burow, CRNA Pre-anesthesia Checklist: Patient identified, Emergency Drugs available, Suction available, Patient being monitored and Timeout performed Patient Re-evaluated:Patient Re-evaluated prior to induction Oxygen Delivery Method: Circle system utilized Preoxygenation: Pre-oxygenation with 100% oxygen Induction Type: IV induction Ventilation: Mask ventilation without difficulty Laryngoscope Size: Mac and 4 Grade View: Grade I Tube type: Oral Tube size: 7.5 mm Number of attempts: 1 Airway Equipment and Method: Stylet Placement Confirmation: ETT inserted through vocal cords under direct vision,  positive ETCO2 and breath sounds checked- equal and bilateral Secured at: 22 cm Tube secured with: Tape Dental Injury: Teeth and Oropharynx as per pre-operative assessment
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
## 2018-09-03 NOTE — Transfer of Care (Signed)
Immediate Anesthesia Transfer of Care Note  Patient: Juan Hunt  Procedure(s) Performed: XI ROBOTIC ASSISTED LAPAROSCOPIC RADICAL PROSTATECTOMY;UMBILICAL HERNIA REPAIR (N/A ) PELVIC LYMPH NODE DISSECTION (Bilateral )  Patient Location: PACU  Anesthesia Type:General  Level of Consciousness: awake, alert  and oriented  Airway & Oxygen Therapy: Patient Spontanous Breathing and Patient connected to face mask oxygen  Post-op Assessment: Report given to RN and Post -op Vital signs reviewed and stable  Post vital signs: Reviewed and stable  Last Vitals:  Vitals Value Taken Time  BP    Temp    Pulse 81 09/03/18 1203  Resp 15 09/03/18 1203  SpO2 98 % 09/03/18 1203  Vitals shown include unvalidated device data.  Last Pain:  Vitals:   09/03/18 0557  TempSrc:   PainSc: 0-No pain      Patients Stated Pain Goal: 4 (88/64/84 7207)  Complications: No apparent anesthesia complications

## 2018-09-04 ENCOUNTER — Encounter (HOSPITAL_COMMUNITY): Payer: Self-pay | Admitting: Urology

## 2018-09-04 DIAGNOSIS — C61 Malignant neoplasm of prostate: Secondary | ICD-10-CM | POA: Diagnosis not present

## 2018-09-04 LAB — BASIC METABOLIC PANEL
Anion gap: 6 (ref 5–15)
BUN: 19 mg/dL (ref 8–23)
CO2: 25 mmol/L (ref 22–32)
Calcium: 8.8 mg/dL — ABNORMAL LOW (ref 8.9–10.3)
Chloride: 106 mmol/L (ref 98–111)
Creatinine, Ser: 1.04 mg/dL (ref 0.61–1.24)
GFR calc Af Amer: >60 mL/min (ref >60)
GFR calc non Af Amer: >60 mL/min (ref >60)
Glucose, Bld: 122 mg/dL — ABNORMAL HIGH (ref 70–99)
Potassium: 4.8 mmol/L (ref 3.5–5.1)
Sodium: 137 mmol/L (ref 135–145)

## 2018-09-04 LAB — HEMOGLOBIN AND HEMATOCRIT, BLOOD
HCT: 39.5 % (ref 39.0–52.0)
Hemoglobin: 12.4 g/dL — ABNORMAL LOW (ref 13.0–17.0)

## 2018-09-04 NOTE — Plan of Care (Signed)
  Problem: Bowel/Gastric: Goal: Gastrointestinal status for postoperative course will improve Outcome: Completed/Met   Problem: Pain Management: Goal: General experience of comfort will improve Outcome: Completed/Met   Problem: Skin Integrity: Goal: Demonstration of wound healing without infection will improve Outcome: Completed/Met   Problem: Urinary Elimination: Goal: Ability to avoid or minimize complications of infection will improve Outcome: Completed/Met Goal: Ability to achieve and maintain urine output will improve Outcome: Completed/Met Goal: Home care management will improve Outcome: Completed/Met   Problem: Education: Goal: Knowledge of General Education information will improve Description: Including pain rating scale, medication(s)/side effects and non-pharmacologic comfort measures Outcome: Completed/Met   Problem: Health Behavior/Discharge Planning: Goal: Ability to manage health-related needs will improve Outcome: Completed/Met   Problem: Clinical Measurements: Goal: Ability to maintain clinical measurements within normal limits will improve Outcome: Completed/Met Goal: Will remain free from infection Outcome: Completed/Met Goal: Diagnostic test results will improve Outcome: Completed/Met Goal: Respiratory complications will improve Outcome: Completed/Met Goal: Cardiovascular complication will be avoided Outcome: Completed/Met   Problem: Activity: Goal: Risk for activity intolerance will decrease Outcome: Completed/Met   Problem: Nutrition: Goal: Adequate nutrition will be maintained Outcome: Completed/Met   Problem: Coping: Goal: Level of anxiety will decrease Outcome: Completed/Met   Problem: Elimination: Goal: Will not experience complications related to bowel motility Outcome: Completed/Met Goal: Will not experience complications related to urinary retention Outcome: Completed/Met   Problem: Pain Managment: Goal: General experience of  comfort will improve Outcome: Completed/Met   Problem: Safety: Goal: Ability to remain free from injury will improve Outcome: Completed/Met   Problem: Skin Integrity: Goal: Risk for impaired skin integrity will decrease Outcome: Completed/Met

## 2018-09-04 NOTE — Discharge Instructions (Signed)

## 2018-09-04 NOTE — Discharge Summary (Addendum)
Physician Discharge Summary  Patient ID: Juan Hunt MRN: [id number] DOB/AGE: 07/26/1950 68 y.o.  Admit date: 09/03/2018 Discharge date: 09/04/2018  Admission Diagnoses: Prostate cancer  Discharge Diagnoses:  Active Problems:   Prostate cancer [facility])   Discharged Condition: good  Hospital Course:  Patient underwent robot assisted laparoscopic prostatectomy on 09/04/2018 for prostate cancer. He tolerated procedure well, and was transferred to floor after PACU recovery. On POD1, he was tolerating regular diet, passing gas, ambulating without difficulty, had pain well controlled on PO medications, and had adequate urine output from Foley catheter. His JP drain was removed prior to discharge. Foley catheter teaching was provided, and patient was discharged with Foley catheter in place. He will follow up on 09/10/2018 for post op visit, catheter removal, and trial of void.   Consults: None  Significant Diagnostic Studies: none  Treatments: surgery: robot assisted laparoscopic prostatectomy   Discharge Exam: Blood pressure (!) 124/59, pulse 61, temperature 98.3 F (36.8 C), temperature source Oral, resp. rate 16, height 5\' 10"  (1.778 m), weight 98.9 kg, SpO2 96 %. General appearance: alert, cooperative, appears stated age and no distress Head: Normocephalic, without obvious abnormality, atraumatic Resp: normal work of breathing Cardio: regular rate Abdomen: soft, appropriately tender at incision sites, incisions c/d/i.  Male genitalia: normal, penis: no lesions or discharge. testes: no masses or tenderness. no hernias. Uncircumcised foreskin appropriately placed over glans with catheter in place. GU: Foley catheter to drainage with light yellow urine, no sediment, no hematuria.  Extremities: extremities normal, atraumatic, no cyanosis or edema Skin: Skin color, texture, turgor normal. No rashes or lesions  Disposition: Discharge disposition: 01-Home or Self Care      Allergies as  of 09/04/2018   No Known Allergies     Medication List    STOP taking these medications   PROSTATE PO     TAKE these medications   aspirin EC 81 MG tablet Take 1 tablet (81 mg total) by mouth daily.   atorvastatin 40 MG tablet Commonly known as: LIPITOR Take 1 tablet (40 mg total) by mouth daily.   BENEFIBER PO Take 1 Scoop by mouth daily.   CAYENNE PLUS GARLIC PO Take 1 capsule by mouth daily.   docusate sodium 50 MG capsule Commonly known as: COLACE Take 50 mg by mouth daily as needed for mild constipation.   meloxicam 15 MG tablet Commonly known as: MOBIC Take 1 tablet (15 mg total) by mouth daily.   multivitamin tablet Take 1 tablet by mouth daily.   sildenafil 20 MG tablet Commonly known as: REVATIO Take 2-5 tablets (40-100 mg total) by mouth daily as needed (relations).   sulfamethoxazole-trimethoprim 800-160 MG tablet Commonly known as: BACTRIM DS Take 1 tablet by mouth 2 (two) times daily. Start the day prior to foley removal appointment   traMADol 50 MG tablet Commonly known as: Ultram Take 1-2 tablets (50-100 mg total) by mouth every 6 (six) hours as needed for moderate pain or severe pain.   trolamine salicylate 10 % cream Commonly known as: ASPERCREME Apply 1 application topically as needed for muscle pain.   Vitamin D3 250 MCG (10000 UT) Tabs Take 10,000 Units by mouth daily.      Follow-up Information    Karen Kays, NP On 09/10/2018.   Specialty: Nurse Practitioner Why: at 9:00 Contact information: [address] [place] [address] [phone]           Signed: [name] 09/04/2018, 12:11 PM

## 2018-09-04 NOTE — Progress Notes (Signed)
1 Day Post-Op Subjective: The patient is doing well.  No nausea or vomiting. Pain is adequately controlled.  Objective: Vital signs in last 24 hours: Temp:  [97.9 F (36.6 C)-98.9 F (37.2 C)] 98.3 F (36.8 C) (08/27 0343) Pulse Rate:  [61-87] 61 (08/27 0343) Resp:  [11-18] 16 (08/27 0343) BP: (109-161)/(59-85) 124/59 (08/27 0343) SpO2:  [92 %-100 %] 96 % (08/27 0343)  Intake/Output from previous day: 08/26 0701 - 08/27 0700 In: 5277.1 [P.O.:860; I.V.:3417.1; IV Piggyback:1000] Out: 2155 [ZQJSI:7395; Drains:115; Blood:250] Intake/Output this shift: No intake/output data recorded.  Physical Exam:  General: Alert and oriented. GI: Soft, Nondistended. Incisions: Dressings intact. Urine: Clear Extremities: Nontender, no erythema, no edema.  Lab Results: Recent Labs    09/03/18 1253 09/04/18 0416  HGB 14.0 12.4*  HCT 45.3 39.5   Recent Labs    09/04/18 0416  NA 137  K 4.8  CL 106  CO2 25  GLUCOSE 122*  BUN 19  CREATININE 1.04  CALCIUM 8.8*      Assessment/Plan: POD# 1 s/p robotic prostatectomy.  1) SL IVF 2) Ambulate, Incentive spirometry 3) Transition to oral pain medication 4) D/C pelvic drain 5) Plan for likely discharge later today   LOS: 0 days   Juan Hunt 09/04/2018, 10:36 AM

## 2018-09-04 NOTE — Anesthesia Postprocedure Evaluation (Signed)
Anesthesia Post Note  Patient: Juan Hunt  Procedure(s) Performed: XI ROBOTIC ASSISTED LAPAROSCOPIC RADICAL PROSTATECTOMY;UMBILICAL HERNIA REPAIR (N/A ) PELVIC LYMPH NODE DISSECTION (Bilateral )     Patient location during evaluation: PACU Anesthesia Type: General Level of consciousness: sedated and patient cooperative Pain management: pain level controlled Vital Signs Assessment: post-procedure vital signs reviewed and stable Respiratory status: spontaneous breathing Cardiovascular status: stable Anesthetic complications: no    Last Vitals:  Vitals:   09/04/18 0015 09/04/18 0343  BP: 123/61 (!) 124/59  Pulse: 63 61  Resp: 18 16  Temp: 36.6 C 36.8 C  SpO2: 92% 96%    Last Pain:  Vitals:   09/04/18 1149  TempSrc:   PainSc: [place]

## 2018-09-05 ENCOUNTER — Encounter: Payer: Self-pay | Admitting: Family Medicine

## 2018-09-22 DIAGNOSIS — N393 Stress incontinence (female) (male): Secondary | ICD-10-CM | POA: Diagnosis not present

## 2018-09-30 NOTE — Progress Notes (Signed)
Left patient voicemail today to inform or remind about support services available at [facility].  Patient encouraged to reach out to me by phone for more information about support groups or to schedule a one-on-one phone support call.  Verlan Friends [place] Counseling Intern Voicemail: [phone]

## 2018-10-08 DIAGNOSIS — M62838 Other muscle spasm: Secondary | ICD-10-CM | POA: Diagnosis not present

## 2018-10-08 DIAGNOSIS — N393 Stress incontinence (female) (male): Secondary | ICD-10-CM | POA: Diagnosis not present

## 2018-10-08 DIAGNOSIS — M6281 Muscle weakness (generalized): Secondary | ICD-10-CM | POA: Diagnosis not present

## 2018-10-21 DIAGNOSIS — N393 Stress incontinence (female) (male): Secondary | ICD-10-CM | POA: Diagnosis not present

## 2018-10-21 DIAGNOSIS — N5231 Erectile dysfunction following radical prostatectomy: Secondary | ICD-10-CM | POA: Diagnosis not present

## 2018-10-21 DIAGNOSIS — Z8546 Personal history of malignant neoplasm of prostate: Secondary | ICD-10-CM | POA: Diagnosis not present

## 2018-11-03 DIAGNOSIS — N393 Stress incontinence (female) (male): Secondary | ICD-10-CM | POA: Diagnosis not present

## 2018-11-03 DIAGNOSIS — M6281 Muscle weakness (generalized): Secondary | ICD-10-CM | POA: Diagnosis not present

## 2018-11-03 DIAGNOSIS — M62838 Other muscle spasm: Secondary | ICD-10-CM | POA: Diagnosis not present

## 2018-11-25 DIAGNOSIS — N393 Stress incontinence (female) (male): Secondary | ICD-10-CM | POA: Diagnosis not present

## 2018-11-25 DIAGNOSIS — M6281 Muscle weakness (generalized): Secondary | ICD-10-CM | POA: Diagnosis not present

## 2018-11-25 DIAGNOSIS — M62838 Other muscle spasm: Secondary | ICD-10-CM | POA: Diagnosis not present

## 2018-12-04 ENCOUNTER — Other Ambulatory Visit: Payer: Self-pay | Admitting: Family Medicine
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
## 2018-12-23 DIAGNOSIS — N393 Stress incontinence (female) (male): Secondary | ICD-10-CM | POA: Diagnosis not present

## 2018-12-23 DIAGNOSIS — M6281 Muscle weakness (generalized): Secondary | ICD-10-CM | POA: Diagnosis not present

## 2018-12-23 DIAGNOSIS — M62838 Other muscle spasm: Secondary | ICD-10-CM | POA: Diagnosis not present

## 2018-12-25 ENCOUNTER — Ambulatory Visit (INDEPENDENT_AMBULATORY_CARE_PROVIDER_SITE_OTHER): Payer: PPO | Admitting: Orthopedic Surgery

## 2018-12-25 ENCOUNTER — Encounter: Payer: Self-pay | Admitting: Orthopedic Surgery

## 2018-12-25 VITALS — Ht 70.0 in | Wt 218.0 lb

## 2018-12-25 DIAGNOSIS — M25571 Pain in right ankle and joints of right foot: Secondary | ICD-10-CM

## 2018-12-26 ENCOUNTER — Encounter: Payer: Self-pay | Admitting: Family Medicine

## 2018-12-26 ENCOUNTER — Ambulatory Visit (INDEPENDENT_AMBULATORY_CARE_PROVIDER_SITE_OTHER): Payer: PPO | Admitting: Family Medicine

## 2018-12-26 ENCOUNTER — Other Ambulatory Visit: Payer: Self-pay

## 2018-12-26 DIAGNOSIS — M67432 Ganglion, left wrist: Secondary | ICD-10-CM | POA: Insufficient documentation

## 2018-12-26 NOTE — Assessment & Plan Note (Signed)
Reviewed etiology and treatment. Suggested wrist brace, meloxicam daily, see sports med or hand surg if persistent to consider cyst aspiration/steroid injection. Pt agrees with plan.

## 2018-12-26 NOTE — Addendum Note (Signed)
Addended by: Ria Bush on: 12/26/2018 08:49 AM   Modules accepted: Level of Service

## 2018-12-26 NOTE — Progress Notes (Signed)
This visit was conducted in person.  BP 120/64 (BP Location: Left Arm, Patient Position: Sitting, Cuff Size: Large)   Pulse 68   Temp 97.7 F (36.5 C) (Temporal)   Ht 5\' 10"  (1.778 m)   Wt 216 lb 4 oz (98.1 kg)   SpO2 96%   BMI 31.03 kg/m    CC: check skin lesion Subjective:    Patient ID: Juan Hunt, male    DOB: May 19, 1950, 68 y.o.   MRN: [id number]  HPI: Juan Hunt is a 68 y.o. male presenting on 12/26/2018 for Cyst (C/o cyst on lateral side of left wrist.  Area is painful.  Noticed 3 wks ago. )   L handed.  3 wk h/o L ventral radial wrist cyst, tender. Not affecting range of motion.  Denies inciting trauma/injury or falls.  He is on meloxicam, taking twice a week.   Ongoing trouble with urinary incontinence after prostatectomy. Seeing PFPT.      Relevant past medical, surgical, family and social history reviewed and updated as indicated. Interim medical history since our last visit reviewed. Allergies and medications reviewed and updated. Outpatient Medications Prior to Visit  Medication Sig Dispense Refill  . aspirin EC 81 MG tablet Take 1 tablet (81 mg total) by mouth daily.    Marland Kitchen atorvastatin (LIPITOR) 40 MG tablet Take 1 tablet (40 mg total) by mouth daily. 90 tablet 3  . Capsicum-Garlic (CAYENNE PLUS GARLIC PO) Take 1 capsule by mouth daily.    . Cholecalciferol (VITAMIN D3) 250 MCG (10000 UT) TABS Take 10,000 Units by mouth daily.    Marland Kitchen docusate sodium (COLACE) 50 MG capsule Take 50 mg by mouth daily as needed for mild constipation.    . meloxicam (MOBIC) 15 MG tablet TAKE 1 TABLET BY MOUTH EVERY DAY 90 tablet 1  . Multiple Vitamin (MULTIVITAMIN) tablet Take 1 tablet by mouth daily.    . sildenafil (REVATIO) 20 MG tablet Take 2-5 tablets (40-100 mg total) by mouth daily as needed (relations). 30 tablet 3  . sulfamethoxazole-trimethoprim (BACTRIM DS) 800-160 MG tablet Take 1 tablet by mouth 2 (two) times daily. Start the day prior to foley removal  appointment 6 tablet 0  . traMADol (ULTRAM) 50 MG tablet Take 1-2 tablets (50-100 mg total) by mouth every 6 (six) hours as needed for moderate pain or severe pain. 20 tablet 0  . trolamine salicylate (ASPERCREME) 10 % cream Apply 1 application topically as needed for muscle pain.    . Wheat Dextrin (BENEFIBER PO) Take 1 Scoop by mouth daily.     No facility-administered medications prior to visit.     Per HPI unless specifically indicated in ROS section below Review of Systems Objective:    BP 120/64 (BP Location: Left Arm, Patient Position: Sitting, Cuff Size: Large)   Pulse 68   Temp 97.7 F (36.5 C) (Temporal)   Ht 5\' 10"  (1.778 m)   Wt 216 lb 4 oz (98.1 kg)   SpO2 96%   BMI 31.03 kg/m   Wt Readings from Last 3 Encounters:  12/26/18 216 lb 4 oz (98.1 kg)  12/25/18 218 lb (98.9 kg)  09/03/18 218 lb (98.9 kg)    Physical Exam Vitals and nursing note reviewed.  Constitutional:      Appearance: Normal appearance. He is not ill-appearing.  Musculoskeletal:        General: Swelling and tenderness present. Normal range of motion.     Comments: Firm cyst present L radial ventral wrist  that is tender to palpation but with preserved wrist ROM  Skin:    General: Skin is warm and dry.     Findings: No erythema or rash.  Neurological:     Mental Status: He is alert.  Psychiatric:        Mood and Affect: Mood normal.        Behavior: Behavior normal.        Assessment & Plan:  This visit occurred during the SARS-CoV-2 public health emergency.  Safety protocols were in place, including screening questions prior to the visit, additional usage of staff PPE, and extensive cleaning of exam room while observing appropriate contact time as indicated for disinfecting solutions.   Problem List Items Addressed This Visit    Ganglion cyst of volar aspect of left wrist    Reviewed etiology and treatment. Suggested wrist brace, meloxicam daily, see sports med or hand surg if persistent to  consider cyst aspiration/steroid injection. Pt agrees with plan.           No orders of the defined types were placed in this encounter.  No orders of the defined types were placed in this encounter.   Patient instructions: I think you have ganglion cyst of left wrist. Start meloxicam daily for 1 week to see if any benefit.  If not improving, return to see Dr Lorelei Pont or let me know for hand surgeon referral.   Follow up plan: Return if symptoms worsen or fail to improve.  Ria Bush, MD

## 2018-12-26 NOTE — Patient Instructions (Signed)
I think you have ganglion cyst of left wrist. Start meloxicam daily for 1 week to see if any benefit.  If not improving, return to see Dr Lorelei Pont or let me know for hand surgeon referral.   Ganglion Cyst  A ganglion cyst is a non-cancerous, fluid-filled lump that occurs near a joint or tendon. The cyst grows out of a joint or the lining of a tendon. Ganglion cysts most often develop in the hand or wrist, but they can also develop in the shoulder, elbow, hip, knee, ankle, or foot. Ganglion cysts are ball-shaped or egg-shaped. Their size can range from the size of a pea to larger than a grape. Increased activity may cause the cyst to get bigger because more fluid starts to build up. What are the causes? The exact cause of this condition is not known, but it may be related to:  Inflammation or irritation around the joint.  An injury.  Repetitive movements or overuse.  Arthritis. What increases the risk? You are more likely to develop this condition if:  You are a woman.  You are 68-44 years old. What are the signs or symptoms? The main symptom of this condition is a lump. It most often appears on the hand or wrist. In many cases, there are no other symptoms, but a cyst can sometimes cause:  Tingling.  Pain.  Numbness.  Muscle weakness.  Weak grip.  Less range of motion in a joint. How is this diagnosed? Ganglion cysts are usually diagnosed based on a physical exam. Your health care provider will feel the lump and may shine a light next to it. If it is a ganglion cyst, the light will likely shine through it. Your health care provider may order an X-ray, ultrasound, or MRI to rule out other conditions. How is this treated? Ganglion cysts often go away on their own without treatment. If you have pain or other symptoms, treatment may be needed. Treatment is also needed if the ganglion cyst limits your movement or if it gets infected. Treatment may include:  Wearing a brace or  splint on your wrist or finger.  Taking anti-inflammatory medicine.  Having fluid drained from the lump with a needle (aspiration).  Getting a steroid injected into the joint.  Having surgery to remove the ganglion cyst.  Placing a pad on your shoe or wearing shoes that will not rub against the cyst if it is on your foot. Follow these instructions at home:  Do not press on the ganglion cyst, poke it with a needle, or hit it.  Take over-the-counter and prescription medicines only as told by your health care provider.  If you have a brace or splint: ? Wear it as told by your health care provider. ? Remove it as told by your health care provider. Ask if you need to remove it when you take a shower or a bath.  Watch your ganglion cyst for any changes.  Keep all follow-up visits as told by your health care provider. This is important. Contact a health care provider if:  Your ganglion cyst becomes larger or more painful.  You have pus coming from the lump.  You have weakness or numbness in the affected area.  You have a fever or chills. Get help right away if:  You have a fever and have any of these in the cyst area: ? Increased redness. ? Red streaks. ? Swelling. Summary  A ganglion cyst is a non-cancerous, fluid-filled lump that occurs near a  joint or tendon.  Ganglion cysts most often develop in the hand or wrist, but they can also develop in the shoulder, elbow, hip, knee, ankle, or foot.  Ganglion cysts often go away on their own without treatment. This information is not intended to replace advice given to you by your health care provider. Make sure you discuss any questions you have with your health care provider. Document Released: 12/23/1999 Document Revised: 12/07/2016 Document Reviewed: 08/24/2016 Elsevier Patient Education  2020 [employer].

## 2018-12-29 ENCOUNTER — Encounter: Payer: Self-pay | Admitting: Orthopedic Surgery

## 2018-12-29 DIAGNOSIS — M25571 Pain in right ankle and joints of right foot: Secondary | ICD-10-CM

## 2018-12-29 MED ORDER — METHYLPREDNISOLONE ACETATE 40 MG/ML IJ SUSP
40.0000 mg | INTRAMUSCULAR | Status: AC | PRN
  Administered 2018-12-29: 10:00:00 40 mg via INTRA_ARTICULAR

## 2018-12-29 MED ORDER — LIDOCAINE HCL 1 % IJ SOLN
1.0000 mL | INTRAMUSCULAR | Status: AC | PRN
  Administered 2018-12-29: 10:00:00 1 mL
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
## 2019-01-20 DIAGNOSIS — N393 Stress incontinence (female) (male): Secondary | ICD-10-CM | POA: Diagnosis not present

## 2019-01-20 DIAGNOSIS — Z8546 Personal history of malignant neoplasm of prostate: Secondary | ICD-10-CM | POA: Diagnosis not present

## 2019-01-23 DIAGNOSIS — M62838 Other muscle spasm: Secondary | ICD-10-CM | POA: Diagnosis not present

## 2019-01-23 DIAGNOSIS — M6281 Muscle weakness (generalized): Secondary | ICD-10-CM | POA: Diagnosis not present

## 2019-01-23 DIAGNOSIS — N393 Stress incontinence (female) (male): Secondary | ICD-10-CM | POA: Diagnosis not present

## 2019-01-27 DIAGNOSIS — N393 Stress incontinence (female) (male): Secondary | ICD-10-CM | POA: Diagnosis not present

## 2019-01-27 DIAGNOSIS — N5231 Erectile dysfunction following radical prostatectomy: Secondary | ICD-10-CM | POA: Diagnosis not present

## 2019-01-27 DIAGNOSIS — Z8546 Personal history of malignant neoplasm of prostate: Secondary | ICD-10-CM | POA: Diagnosis not present

## 2019-01-30 ENCOUNTER — Ambulatory Visit: Payer: PPO | Attending: Internal Medicine

## 2019-01-30 DIAGNOSIS — Z23 Encounter for immunization: Secondary | ICD-10-CM | POA: Insufficient documentation

## 2019-02-02 NOTE — Progress Notes (Signed)
   Covid-19 Vaccination Clinic  Name:  Juan Hunt    MRN: [id number] DOB: February 01, 1950  01/30/2019  [name] was observed post Covid-19 immunization for 15 minutes without incidence. He was provided with Vaccine Information Sheet and instruction to access the V-Safe system.   [name] was instructed to call 911 with any severe reactions post vaccine: Marland Kitchen Difficulty breathing  . Swelling of your face and throat  . A fast heartbeat  . A bad rash all over your body  . Dizziness and weakness    Immunizations Administered    Name Date Dose VIS Date Route   Moderna COVID-19 Vaccine 01/30/2019  9:40 AM 0.5 mL 12/09/2018 Intramuscular   Manufacturer: Moderna   Lot: [id number]   [place]: [id number]

## 2019-02-04 ENCOUNTER — Telehealth: Payer: Self-pay | Admitting: Family Medicine

## 2019-02-04 DIAGNOSIS — M67432 Ganglion, left wrist: Secondary | ICD-10-CM

## 2019-02-04 NOTE — Telephone Encounter (Signed)
Spoke with pt relaying Dr. Synthia Innocent message.  Pt verbalizes understanding and prefers to be referred to hand surgeon.

## 2019-02-04 NOTE — Telephone Encounter (Signed)
Patient called to report that the cyst has not gone away. He would like to speak with you and wanted to know if he needed to schedule a virtual visit.

## 2019-02-04 NOTE — Telephone Encounter (Signed)
Noted.  Next step is referral to sports med or hand surgery to consider possible aspiration of cyst. If he'd like he can start with appt with Dr Lorelei Pont in our office.

## 2019-02-04 NOTE — Addendum Note (Signed)
Addended by: Ria Bush on: 02/04/2019 05:25 PM   Modules accepted: Orders

## 2019-02-05 NOTE — Telephone Encounter (Signed)
Appt made and  Patient notified.

## 2019-02-10 DIAGNOSIS — M67432 Ganglion, left wrist: Secondary | ICD-10-CM | POA: Diagnosis not present

## 2019-02-24 ENCOUNTER — Other Ambulatory Visit (INDEPENDENT_AMBULATORY_CARE_PROVIDER_SITE_OTHER): Payer: PPO

## 2019-02-24 ENCOUNTER — Other Ambulatory Visit: Payer: Self-pay

## 2019-02-24 DIAGNOSIS — C61 Malignant neoplasm of prostate: Secondary | ICD-10-CM | POA: Diagnosis not present

## 2019-02-24 DIAGNOSIS — R7303 Prediabetes: Secondary | ICD-10-CM | POA: Diagnosis not present

## 2019-02-24 DIAGNOSIS — E785 Hyperlipidemia, unspecified: Secondary | ICD-10-CM

## 2019-02-24 LAB — CBC WITH DIFFERENTIAL/PLATELET
Basophils Absolute: 0 10*3/uL (ref 0.0–0.1)
Basophils Relative: 0.6 % (ref 0.0–3.0)
Eosinophils Absolute: 0 10*3/uL (ref 0.0–0.7)
Eosinophils Relative: 0.8 % (ref 0.0–5.0)
HCT: 44.5 % (ref 39.0–52.0)
Hemoglobin: 14.2 g/dL (ref 13.0–17.0)
Lymphocytes Relative: 52.7 % — ABNORMAL HIGH (ref 12.0–46.0)
Lymphs Abs: 2 10*3/uL (ref 0.7–4.0)
MCHC: 31.9 g/dL (ref 30.0–36.0)
MCV: 81.2 fl (ref 78.0–100.0)
Monocytes Absolute: 0.4 10*3/uL (ref 0.1–1.0)
Monocytes Relative: 9.9 % (ref 3.0–12.0)
Neutro Abs: 1.4 10*3/uL (ref 1.4–7.7)
Neutrophils Relative %: 36 % — ABNORMAL LOW (ref 43.0–77.0)
Platelets: 171 10*3/uL (ref 150.0–400.0)
RBC: 5.48 Mil/uL (ref 4.22–5.81)
RDW: 14.8 % (ref 11.5–15.5)
WBC: 3.8 10*3/uL — ABNORMAL LOW (ref 4.0–10.5)

## 2019-02-24 LAB — HEMOGLOBIN A1C: Hgb A1c MFr Bld: 6.9 % — ABNORMAL HIGH (ref 4.6–6.5)

## 2019-02-24 LAB — COMPREHENSIVE METABOLIC PANEL
ALT: 26 U/L (ref 0–53)
AST: 19 U/L (ref 0–37)
Albumin: 4.2 g/dL (ref 3.5–5.2)
Alkaline Phosphatase: 81 U/L (ref 39–117)
BUN: 23 mg/dL (ref 6–23)
CO2: 29 mEq/L (ref 19–32)
Calcium: 9.3 mg/dL (ref 8.4–10.5)
Chloride: 102 mEq/L (ref 96–112)
Creatinine, Ser: 1.01 mg/dL (ref 0.40–1.50)
GFR: 88.66 mL/min (ref >60.00)
Glucose, Bld: 92 mg/dL (ref 70–99)
Potassium: 4.4 mEq/L (ref 3.5–5.1)
Sodium: 137 mEq/L (ref 135–145)
Total Bilirubin: 0.4 mg/dL (ref 0.2–1.2)
Total Protein: 7.1 g/dL (ref 6.0–8.3)

## 2019-02-24 LAB — LIPID PANEL
Cholesterol: 180 mg/dL (ref 0–200)
HDL: 38 mg/dL — ABNORMAL LOW (ref >39.00)
LDL Cholesterol: 104 mg/dL — ABNORMAL HIGH (ref 0–99)
NonHDL: 141.98
Total CHOL/HDL Ratio: 5
Triglycerides: 188 mg/dL — ABNORMAL HIGH (ref 0.0–149.0)
VLDL: 37.6 mg/dL (ref 0.0–40.0)

## 2019-02-27 ENCOUNTER — Encounter: Payer: PPO | Admitting: Family Medicine

## 2019-02-27 ENCOUNTER — Ambulatory Visit: Payer: PPO

## 2019-03-06 ENCOUNTER — Ambulatory Visit: Payer: PPO | Attending: Internal Medicine

## 2019-03-06 DIAGNOSIS — Z23 Encounter for immunization: Secondary | ICD-10-CM | POA: Insufficient documentation

## 2019-03-06 NOTE — Progress Notes (Signed)
   Covid-19 Vaccination Clinic  Name:  ABIEL ANTRIM    MRN: [id number] DOB: 28-Oct-1950  03/06/2019  Mr. Hallisey was observed post Covid-19 immunization for 15 minutes without incidence. He was provided with Vaccine Information Sheet and instruction to access the V-Safe system.   Mr. Mckimmy was instructed to call 911 with any severe reactions post vaccine: Marland Kitchen Difficulty breathing  . Swelling of your face and throat  . A fast heartbeat  . A bad rash all over your body  . Dizziness and weakness    Immunizations Administered    Name Date Dose VIS Date Route   Moderna COVID-19 Vaccine 03/06/2019  9:16 AM 0.5 mL 12/09/2018 Intramuscular   Manufacturer: Moderna   Lot: [id number]   [place]: [id number]

## 2019-03-11 ENCOUNTER — Other Ambulatory Visit: Payer: Self-pay

## 2019-03-11 ENCOUNTER — Encounter: Payer: Self-pay | Admitting: Family Medicine

## 2019-03-11 ENCOUNTER — Ambulatory Visit (INDEPENDENT_AMBULATORY_CARE_PROVIDER_SITE_OTHER): Payer: PPO | Admitting: Family Medicine

## 2019-03-11 VITALS — BP 140/76 | HR 74 | Temp 98.1°F | Ht 68.5 in | Wt 221.3 lb

## 2019-03-11 DIAGNOSIS — Z0000 Encounter for general adult medical examination without abnormal findings: Secondary | ICD-10-CM | POA: Diagnosis not present

## 2019-03-11 DIAGNOSIS — E785 Hyperlipidemia, unspecified: Secondary | ICD-10-CM

## 2019-03-11 DIAGNOSIS — C61 Malignant neoplasm of prostate: Secondary | ICD-10-CM

## 2019-03-11 DIAGNOSIS — E66811 Obesity, class 1: Secondary | ICD-10-CM

## 2019-03-11 DIAGNOSIS — E669 Obesity, unspecified: Secondary | ICD-10-CM

## 2019-03-11 DIAGNOSIS — E118 Type 2 diabetes mellitus with unspecified complications: Secondary | ICD-10-CM

## 2019-03-11 DIAGNOSIS — N529 Male erectile dysfunction, unspecified: Secondary | ICD-10-CM

## 2019-03-11 DIAGNOSIS — Z7189 Other specified counseling: Secondary | ICD-10-CM

## 2019-03-11 MED ORDER — SILDENAFIL CITRATE 20 MG PO TABS: 40.0000 mg | ORAL_TABLET | Freq: Every day | ORAL | 1 refills | Status: DC | PRN

## 2019-03-11 NOTE — Progress Notes (Signed)
This visit was conducted in person.  BP 140/76 (BP Location: Left Arm, Patient Position: Sitting, Cuff Size: Large)   Pulse 74   Temp 98.1 F (36.7 C) (Temporal)   Ht 5' 8.5" (1.74 m)   Wt 221 lb 5 oz (100.4 kg)   SpO2 97%   BMI 33.16 kg/m    CC: AMW Subjective:    Patient ID: Juan Hunt, male    DOB: [birth year], 69 y.o.   MRN: [id number]  HPI: Juan Hunt is a 69 y.o. male presenting on 03/11/2019 for Medicare Wellness   Did not see health advisor this year.    Hearing Screening   125Hz  250Hz  500Hz  1000Hz  2000Hz  3000Hz  4000Hz  6000Hz  8000Hz   Right ear:   20 20 20  20     Left ear:   20 20 20  20     Vision Screening Comments: Last eye exam, 02/2019.  No glaucoma.    Office Visit from 03/11/2019 in [place] [facility] [place]  PHQ-2 Total Score  0      Fall Risk  03/11/2019 06/06/2018 04/02/2017 08/16/2015  Falls in the past year? 0 0 Yes Yes  Comment - - 4 falls off of knee scooter; denies balance issue -  Number falls in past yr: - - 2 or more 1  Injury with Fall? - - Yes Yes  Comment - - hurt right foot; laceration to toe -  Risk for fall due to : - - Impaired mobility -     L volar wrist cyst - s/p aspiration by hand surgeon. May be returning. Will watch this.   Preventative: COLONOSCOPY Date: 04/2013 2 TA, mod diverticulosis, rpt 5 yrs (Pyrtle) COLONOSCOPY 06/2018 - diverticulosis, rpt 55yr (Pyrtle)  Low risk prostate cancer- followed by urology Dr Louis Meckel s/p robotic assisted lap prostatectomy 08/2018. Some ongoing urinary incontinence. PSA undetectable. Seeing Q3 mo at this time.  Lung cancer screening -Not eligible.Ex smoker quit 1999 - smoked 1/2 ppd for about 12 yrs. Flu shot - yearly Pneumovax 10/2016.  Tdap2/2016 zostavax - 2014 Moderna covid vaccine completed 02/2019  Shingrix - discussed Advanced directive discussion - Has packet at home - asked to bring [place] copy. Would want daughter [name] to be HCPOA. Seat belt use discussed Sunscreen use  discussed,no changing moles on skin Ex smoker - quit 1999 Alcohol - none Dentist - q6 mo Eye exam - yearly - just saw, good report Bowel - slight constipation - due to diet Bladder - ongoing incontinence since prostate surgery - uses 2-3 depends/day. Sometimes wears external cath   Lives with wife Grown children Occupation: retired Development worker, community, still does some plumbing work.  Edu: HS Activity:joined [employer] 2x/wk Diet: some water, fruits/vegetables daily, working on [employer] 7th day adventist     Relevant past medical, surgical, family and social history reviewed and updated as indicated. Interim medical history since our last visit reviewed. Allergies and medications reviewed and updated. Outpatient Medications Prior to Visit  Medication Sig Dispense Refill  . atorvastatin (LIPITOR) 40 MG tablet Take 1 tablet (40 mg total) by mouth daily. 90 tablet 3  . Capsicum-Garlic (CAYENNE PLUS GARLIC PO) Take 1 capsule by mouth daily.    . Cholecalciferol (VITAMIN D3) 250 MCG (10000 UT) TABS Take 10,000 Units by mouth daily.    Marland Kitchen docusate sodium (COLACE) 50 MG capsule Take 50 mg by mouth daily as needed for mild constipation.    . meloxicam (MOBIC) 15 MG tablet TAKE 1 TABLET BY MOUTH  EVERY DAY 90 tablet 1  . traMADol (ULTRAM) 50 MG tablet Take 1-2 tablets (50-100 mg total) by mouth every 6 (six) hours as needed for moderate pain or severe pain. 20 tablet 0  . trolamine salicylate (ASPERCREME) 10 % cream Apply 1 application topically as needed for muscle pain.    . Wheat Dextrin (BENEFIBER PO) Take 1 Scoop by mouth daily.    . sildenafil (REVATIO) 20 MG tablet Take 2-5 tablets (40-100 mg total) by mouth daily as needed (relations). (Patient taking differently: Take 40-100 mg by mouth daily as needed (relations). Takes 5 tablets (100 mg) daily, per urology.) 30 tablet 3  . aspirin EC 81 MG tablet Take 1 tablet (81 mg total) by mouth daily.    . Multiple Vitamin (MULTIVITAMIN) tablet  Take 1 tablet by mouth daily.    Marland Kitchen sulfamethoxazole-trimethoprim (BACTRIM DS) 800-160 MG tablet Take 1 tablet by mouth 2 (two) times daily. Start the day prior to foley removal appointment 6 tablet 0   No facility-administered medications prior to visit.     Per HPI unless specifically indicated in ROS section below Review of Systems  Constitutional: Negative for activity change, appetite change, chills, fatigue, fever and unexpected weight change.  HENT: Negative for hearing loss.        Rare tinnitus  Eyes: Negative for visual disturbance.  Respiratory: Negative for cough, chest tightness, shortness of breath and wheezing.   Cardiovascular: Negative for chest pain, palpitations and leg swelling.  Gastrointestinal: Positive for constipation (mild). Negative for abdominal distention, abdominal pain, blood in stool, diarrhea, nausea and vomiting.  Genitourinary: Negative for difficulty urinating and hematuria.  Musculoskeletal: Negative for arthralgias, myalgias and neck pain.  Skin: Negative for rash.  Neurological: Negative for dizziness, seizures, syncope and headaches.  Hematological: Negative for adenopathy. Does not bruise/bleed easily.  Psychiatric/Behavioral: Negative for dysphoric mood. The patient is not nervous/anxious.    Objective:    BP 140/76 (BP Location: Left Arm, Patient Position: Sitting, Cuff Size: Large)   Pulse 74   Temp 98.1 F (36.7 C) (Temporal)   Ht 5' 8.5" (1.74 m)   Wt 221 lb 5 oz (100.4 kg)   SpO2 97%   BMI 33.16 kg/m   Wt Readings from Last 3 Encounters:  03/11/19 221 lb 5 oz (100.4 kg)  12/26/18 216 lb 4 oz (98.1 kg)  12/25/18 218 lb (98.9 kg)    Physical Exam Vitals and nursing note reviewed.  Constitutional:      General: He is not in acute distress.    Appearance: Normal appearance. He is well-developed. He is not ill-appearing.  HENT:     Head: Normocephalic and atraumatic.     Right Ear: Hearing, tympanic membrane, ear canal and external  ear normal.     Left Ear: Hearing, tympanic membrane, ear canal and external ear normal.     Mouth/Throat:     Pharynx: Uvula midline.  Eyes:     General: No scleral icterus.    Extraocular Movements: Extraocular movements intact.     Conjunctiva/sclera: Conjunctivae normal.     Pupils: Pupils are equal, round, and reactive to light.  Cardiovascular:     Rate and Rhythm: Normal rate and regular rhythm.     Pulses: Normal pulses.          Radial pulses are 2+ on the right side and 2+ on the left side.     Heart sounds: Normal heart sounds. No murmur.  Pulmonary:     Effort:  Pulmonary effort is normal. No respiratory distress.     Breath sounds: Normal breath sounds. No wheezing, rhonchi or rales.  Abdominal:     General: Abdomen is flat. Bowel sounds are normal. There is no distension.     Palpations: Abdomen is soft. There is no mass.     Tenderness: There is no abdominal tenderness. There is no guarding or rebound.     Hernia: No hernia is present.  Musculoskeletal:        General: Normal range of motion.     Cervical back: Normal range of motion and neck supple.     Right lower leg: No edema.     Left lower leg: No edema.  Lymphadenopathy:     Cervical: No cervical adenopathy.  Skin:    General: Skin is warm and dry.     Findings: No rash.  Neurological:     General: No focal deficit present.     Mental Status: He is alert and oriented to person, place, and time.     Comments:  CN grossly intact, station and gait intact Recall 3/3 Calculation 4/5 DLORW  Psychiatric:        Mood and Affect: Mood normal.        Behavior: Behavior normal.        Thought Content: Thought content normal.        Judgment: Judgment normal.       Results for orders placed or performed in visit on 02/24/19  CBC with Differential/Platelet  Result Value Ref Range   WBC 3.8 (L) 4.0 - 10.5 K/uL   RBC 5.48 4.22 - 5.81 Mil/uL   Hemoglobin 14.2 13.0 - 17.0 g/dL   HCT 44.5 39.0 - 52.0 %   MCV  81.2 78.0 - 100.0 fl   MCHC 31.9 30.0 - 36.0 g/dL   RDW 14.8 11.5 - 15.5 %   Platelets 171.0 150.0 - 400.0 K/uL   Neutrophils Relative % 36.0 (L) 43.0 - 77.0 %   Lymphocytes Relative 52.7 (H) 12.0 - 46.0 %   Monocytes Relative 9.9 3.0 - 12.0 %   Eosinophils Relative 0.8 0.0 - 5.0 %   Basophils Relative 0.6 0.0 - 3.0 %   Neutro Abs 1.4 1.4 - 7.7 K/uL   Lymphs Abs 2.0 0.7 - 4.0 K/uL   Monocytes Absolute 0.4 0.1 - 1.0 K/uL   Eosinophils Absolute 0.0 0.0 - 0.7 K/uL   Basophils Absolute 0.0 0.0 - 0.1 K/uL  Hemoglobin A1c  Result Value Ref Range   Hgb A1c MFr Bld 6.9 (H) 4.6 - 6.5 %  Comprehensive metabolic panel  Result Value Ref Range   Sodium 137 135 - 145 mEq/L   Potassium 4.4 3.5 - 5.1 mEq/L   Chloride 102 96 - 112 mEq/L   CO2 29 19 - 32 mEq/L   Glucose, Bld 92 70 - 99 mg/dL   BUN 23 6 - 23 mg/dL   Creatinine, Ser 1.01 0.40 - 1.50 mg/dL   Total Bilirubin 0.4 0.2 - 1.2 mg/dL   Alkaline Phosphatase 81 39 - 117 U/L   AST 19 0 - 37 U/L   ALT 26 0 - 53 U/L   Total Protein 7.1 6.0 - 8.3 g/dL   Albumin 4.2 3.5 - 5.2 g/dL   GFR 88.66 >60.00 mL/min   Calcium 9.3 8.4 - 10.5 mg/dL  Lipid panel  Result Value Ref Range   Cholesterol 180 0 - 200 mg/dL   Triglycerides 188.0 (H) 0.0 - 149.0  mg/dL   HDL 38.00 (L) >39.00 mg/dL   VLDL 37.6 0.0 - 40.0 mg/dL   LDL Cholesterol 104 (H) 0 - 99 mg/dL   Total CHOL/HDL Ratio 5    NonHDL 141.98    Lab Results  Component Value Date   PSA1 6.6 (H) 08/28/2016   PSA1 5.0 (H) 03/05/2016   PSA1 5.6 (H) 02/28/2015   PSA 7.19 (H) 05/30/2018   PSA 5.87 (H) 02/23/2015   PSA 5.07 (H) 02/18/2015     Assessment & Plan:  This visit occurred during the SARS-CoV-2 public health emergency.  Safety protocols were in place, including screening questions prior to the visit, additional usage of staff PPE, and extensive cleaning of exam room while observing appropriate contact time as indicated for disinfecting solutions.   Problem List Items Addressed This  Visit    Prostate cancer ([place])    Appreciate uro/onc care. He underwent robotic assisted laparoscopic prostatectomy last August. He states PSA levels now undetectable.       Obesity, Class I, BMI 30-34.9    Encouraged healthy diet and lifestyle changes to affect sustainable weight loss.       Medicare annual wellness visit, subsequent - Primary    I have personally reviewed the Medicare Annual Wellness questionnaire and have noted 1. The patient's medical and social history 2. Their use of alcohol, tobacco or illicit drugs 3. Their current medications and supplements 4. The patient's functional ability including ADL's, fall risks, home safety risks and hearing or visual impairment. Cognitive function has been assessed and addressed as indicated.  5. Diet and physical activity 6. Evidence for depression or mood disorders The patients weight, height, BMI have been recorded in the chart. I have made referrals, counseling and provided education to the patient based on review of the above and I have provided the pt with a written personalized care plan for preventive services. Provider list updated.. See scanned questionairre as needed for further documentation. Reviewed preventative protocols and updated unless pt declined.       Hyperlipidemia    Chronic, continues atorvastatin 40mg  daily. LDL goal now <100 with diabetes diagnosis. If sugars remain in diabetes range, may increase atorva dosing.  The 10-year ASCVD risk score Mikey Bussing DC Brooke Bonito., et al., 2013) is: 24.1%   Values used to calculate the score:     Age: 47 years     Sex: Male     Is Non-Hispanic African American: Yes     Diabetic: Yes     Tobacco smoker: No     Systolic Blood Pressure: 202 mmHg     Is BP treated: No     HDL Cholesterol: 38 mg/dL     Total Cholesterol: 180 mg/dL       Relevant Medications   sildenafil (REVATIO) 20 MG tablet   Health maintenance examination    Preventative protocols reviewed and updated  unless pt declined. Discussed healthy diet and lifestyle.       Erectile dysfunction of organic origin    Requests sildenafil refill - was advised by urology to take regularly to improve blood flow.       Controlled diabetes mellitus type 2 with complications ([place])    Extensively reviewed latest A1c - now in diabetes range. He will renew efforts at low sugar low carb diet, weight loss and return in 3 months for close f/u.       Advanced care planning/counseling discussion    Advanced directive discussion - Has packet at home -  asked to bring [place] copy. Would want daughter [name] to be HCPOA.          Meds ordered this encounter  Medications  . sildenafil (REVATIO) 20 MG tablet    Sig: Take 2-5 tablets (40-100 mg total) by mouth daily as needed (relations).    Dispense:  150 tablet    Refill:  1   No orders of the defined types were placed in this encounter.   Patient instructions: Wait a few months, but then check with pharmacy about new 2 shot shingles series (shingrix).  Bring [place] copy of your advanced directives.  Sugar was too high - in controlled diabetes range. Work on low sugar low carb diet, weight loss, and avoid added sugars and sweetened beverages, return in 3-4 months for office visit to recheck sugar control.   Follow up plan: Return in about 3 months (around 06/11/2019) for follow up visit.  Ria Bush, MD

## 2019-03-11 NOTE — Assessment & Plan Note (Signed)

## 2019-03-11 NOTE — Assessment & Plan Note (Signed)
Preventative protocols reviewed and updated unless pt declined. Discussed healthy diet and lifestyle.  

## 2019-03-11 NOTE — Assessment & Plan Note (Addendum)
Advanced directive discussion - Has packet at home - asked to bring [place] copy. Would want daughter Juan Hunt to be HCPOA.

## 2019-03-11 NOTE — Patient Instructions (Addendum)
Wait a few months, but then check with pharmacy about new 2 shot shingles series (shingrix).  Bring [place] copy of your advanced directives.  Sugar was too high - in controlled diabetes range. Work on low sugar low carb diet, weight loss, and avoid added sugars and sweetened beverages, return in 3-4 months for office visit to recheck sugar control.   Health Maintenance After Age 69 After age 69, you are at a higher risk for certain long-term diseases and infections as well as injuries from falls. Falls are a major cause of broken bones and head injuries in people who are older than age 15. Getting regular preventive care can help to keep you healthy and well. Preventive care includes getting regular testing and making lifestyle changes as recommended by your health care provider. Talk with your health care provider about:  Which screenings and tests you should have. A screening is a test that checks for a disease when you have no symptoms.  A diet and exercise plan that is right for you. What should I know about screenings and tests to prevent falls? Screening and testing are the best ways to find a health problem early. Early diagnosis and treatment give you the best chance of managing medical conditions that are common after age 79. Certain conditions and lifestyle choices may make you more likely to have a fall. Your health care provider may recommend:  Regular vision checks. Poor vision and conditions such as cataracts can make you more likely to have a fall. If you wear glasses, make sure to get your prescription updated if your vision changes.  Medicine review. Work with your health care provider to regularly review all of the medicines you are taking, including over-the-counter medicines. Ask your health care provider about any side effects that may make you more likely to have a fall. Tell your health care provider if any medicines that you take make you feel dizzy or sleepy.  Osteoporosis  screening. Osteoporosis is a condition that causes the bones to get weaker. This can make the bones weak and cause them to break more easily.  Blood pressure screening. Blood pressure changes and medicines to control blood pressure can make you feel dizzy.  Strength and balance checks. Your health care provider may recommend certain tests to check your strength and balance while standing, walking, or changing positions.  Foot health exam. Foot pain and numbness, as well as not wearing proper footwear, can make you more likely to have a fall.  Depression screening. You may be more likely to have a fall if you have a fear of falling, feel emotionally low, or feel unable to do activities that you used to do.  Alcohol use screening. Using too much alcohol can affect your balance and may make you more likely to have a fall. What actions can I take to lower my risk of falls? General instructions  Talk with your health care provider about your risks for falling. Tell your health care provider if: ? You fall. Be sure to tell your health care provider about all falls, even ones that seem minor. ? You feel dizzy, sleepy, or off-balance.  Take over-the-counter and prescription medicines only as told by your health care provider. These include any supplements.  Eat a healthy diet and maintain a healthy weight. A healthy diet includes low-fat dairy products, low-fat (lean) meats, and fiber from whole grains, beans, and lots of fruits and vegetables. Home safety  Remove any tripping hazards, such as  rugs, cords, and clutter.  Install safety equipment such as grab bars in bathrooms and safety rails on stairs.  Keep rooms and walkways well-lit. Activity   Follow a regular exercise program to stay fit. This will help you maintain your balance. Ask your health care provider what types of exercise are appropriate for you.  If you need a cane or walker, use it as recommended by your health care  provider.  Wear supportive shoes that have nonskid soles. Lifestyle  Do not drink alcohol if your health care provider tells you not to drink.  If you drink alcohol, limit how much you have: ? 0-1 drink a day for women. ? 0-2 drinks a day for men.  Be aware of how much alcohol is in your drink. In the [place] one drink equals one typical bottle of beer (12 oz), one-half glass of wine (5 oz), or one shot of hard liquor (1 oz).  Do not use any products that contain nicotine or tobacco, such as cigarettes and e-cigarettes. If you need help quitting, ask your health care provider. Summary  Having a healthy lifestyle and getting preventive care can help to protect your health and wellness after age 64.  Screening and testing are the best way to find a health problem early and help you avoid having a fall. Early diagnosis and treatment give you the best chance for managing medical conditions that are more common for people who are older than age 90.  Falls are a major cause of broken bones and head injuries in people who are older than age 32. Take precautions to prevent a fall at home.  Work with your health care provider to learn what changes you can make to improve your health and wellness and to prevent falls. This information is not intended to replace advice given to you by your health care provider. Make sure you discuss any questions you have with your health care provider. Document Revised: 04/17/2018 Document Reviewed: 11/07/2016 Elsevier Patient Education  2020 [employer].

## 2019-03-12 NOTE — Assessment & Plan Note (Signed)
Chronic, continues atorvastatin 40mg  daily. LDL goal now <100 with diabetes diagnosis. If sugars remain in diabetes range, may increase atorva dosing.  The 10-year ASCVD risk score Mikey Bussing DC Brooke Bonito., et al., 2013) is: 24.1%   Values used to calculate the score:     Age: 69 years     Sex: Male     Is Non-Hispanic African American: Yes     Diabetic: Yes     Tobacco smoker: No     Systolic Blood Pressure: 540 mmHg     Is BP treated: No     HDL Cholesterol: 38 mg/dL     Total Cholesterol: 180 mg/dL

## 2019-03-12 NOTE — Assessment & Plan Note (Signed)
Requests sildenafil refill - was advised by urology to take regularly to improve blood flow.

## 2019-03-12 NOTE — Assessment & Plan Note (Addendum)
Appreciate uro/onc care. He underwent robotic assisted laparoscopic prostatectomy last August. He states PSA levels now undetectable.

## 2019-03-12 NOTE — Assessment & Plan Note (Signed)
Extensively reviewed latest A1c - now in diabetes range. He will renew efforts at low sugar low carb diet, weight loss and return in 3 months for close f/u.

## 2019-03-12 NOTE — Assessment & Plan Note (Signed)
Encouraged healthy diet and lifestyle changes to affect sustainable weight loss.  

## 2019-06-05 ENCOUNTER — Other Ambulatory Visit: Payer: Self-pay | Admitting: Family Medicine

## 2019-06-05 NOTE — Telephone Encounter (Signed)
Meloxicam Last filled:  03/07/19, #90 Last OV:  03/11/19, AWV Next OV:  06/15/19, 3-4 mo f/u

## 2019-06-10 ENCOUNTER — Other Ambulatory Visit: Payer: PPO

## 2019-06-15 ENCOUNTER — Other Ambulatory Visit: Payer: Self-pay

## 2019-06-15 ENCOUNTER — Encounter: Payer: Self-pay | Admitting: Family Medicine

## 2019-06-15 ENCOUNTER — Ambulatory Visit (INDEPENDENT_AMBULATORY_CARE_PROVIDER_SITE_OTHER): Payer: PPO | Admitting: Family Medicine

## 2019-06-15 VITALS — BP 126/70 | HR 64 | Temp 97.9°F | Ht 68.5 in | Wt 223.2 lb

## 2019-06-15 DIAGNOSIS — E669 Obesity, unspecified: Secondary | ICD-10-CM | POA: Diagnosis not present

## 2019-06-15 DIAGNOSIS — R7303 Prediabetes: Secondary | ICD-10-CM

## 2019-06-15 DIAGNOSIS — E118 Type 2 diabetes mellitus with unspecified complications: Secondary | ICD-10-CM

## 2019-06-15 LAB — POCT GLYCOSYLATED HEMOGLOBIN (HGB A1C): Hemoglobin A1C: 5.9 % — AB (ref 4.0–5.6)

## 2019-06-15 NOTE — Assessment & Plan Note (Signed)
Reviewed healthy diet and lifestyle changes recently implemented.

## 2019-06-15 NOTE — Patient Instructions (Addendum)
Sugars are now back in prediabetes range - continue healthy lifestye and diet changes to date!  Congratulations! Return for physical next 03/2020, sooner if needed.

## 2019-06-15 NOTE — Progress Notes (Signed)
This visit was conducted in person.  BP 126/70 (BP Location: Left Arm, Patient Position: Sitting, Cuff Size: Large)   Pulse 64   Temp 97.9 F (36.6 C) (Temporal)   Ht 5' 8.5" (1.74 m)   Wt 223 lb 3 oz (101.2 kg)   SpO2 97%   BMI 33.44 kg/m    CC: 3 mo DM f/u visit  Subjective:    Patient ID: Juan Hunt, male    DOB: [birth year], 69 y.o.   MRN: [id number]  HPI: Juan Hunt is a 69 y.o. male presenting on 06/15/2019 for Diabetes (Here for 3-4 mo f/u. )   Just bought new boat - excited to continue fresh water fishing.   Ongoing urinary incontinence after prostate cancer surgery.   Started walking 6d/wk - 40 min walk. Started juicing as well. Smaller dinners.   DM - recent progression to diabetes. Does not regularly check sugars. Compliant with antihyperglycemic regimen which includes: diet controlled. Denies low sugars or hypoglycemic symptoms. Denies paresthesias. Diabetic eye exam: saw ~02/2019. Pneumovax: 2018. Prevnar: DUE. Glucometer brand: doesn't have at home. DSME: discussed.  Lab Results  Component Value Date   HGBA1C 5.9 (A) 06/15/2019   Diabetic Foot Exam - Simple   Simple Foot Form Diabetic Foot exam was performed with the following findings: Yes 06/15/2019  8:45 AM  Visual Inspection No deformities, no ulcerations, no other skin breakdown bilaterally: Yes Sensation Testing Intact to touch and monofilament testing bilaterally: Yes Pulse Check Posterior Tibialis and Dorsalis pulse intact bilaterally: Yes Comments Scarring from incision from foot surgery on right    No results found for: Derl Barrow       Relevant past medical, surgical, family and social history reviewed and updated as indicated. Interim medical history since our last visit reviewed. Allergies and medications reviewed and updated. Outpatient Medications Prior to Visit  Medication Sig Dispense Refill  . atorvastatin (LIPITOR) 40 MG tablet Take 1 tablet (40 mg total) by mouth  daily. 90 tablet 3  . Capsicum-Garlic (CAYENNE PLUS GARLIC PO) Take 1 capsule by mouth daily.    . Cholecalciferol (VITAMIN D3) 250 MCG (10000 UT) TABS Take 10,000 Units by mouth daily.    Marland Kitchen docusate sodium (COLACE) 50 MG capsule Take 50 mg by mouth daily as needed for mild constipation.    . meloxicam (MOBIC) 15 MG tablet TAKE 1 TABLET BY MOUTH EVERY DAY 90 tablet 1  . sildenafil (REVATIO) 20 MG tablet Take 2-5 tablets (40-100 mg total) by mouth daily as needed (relations). 150 tablet 1  . traMADol (ULTRAM) 50 MG tablet Take 1-2 tablets (50-100 mg total) by mouth every 6 (six) hours as needed for moderate pain or severe pain. 20 tablet 0  . trolamine salicylate (ASPERCREME) 10 % cream Apply 1 application topically as needed for muscle pain.    . Wheat Dextrin (BENEFIBER PO) Take 1 Scoop by mouth daily.     No facility-administered medications prior to visit.     Per HPI unless specifically indicated in ROS section below Review of Systems Objective:  BP 126/70 (BP Location: Left Arm, Patient Position: Sitting, Cuff Size: Large)   Pulse 64   Temp 97.9 F (36.6 C) (Temporal)   Ht 5' 8.5" (1.74 m)   Wt 223 lb 3 oz (101.2 kg)   SpO2 97%   BMI 33.44 kg/m   Wt Readings from Last 3 Encounters:  06/15/19 223 lb 3 oz (101.2 kg)  03/11/19 221 lb 5 oz (100.4 kg)  12/26/18 216 lb 4 oz (98.1 kg)      Physical Exam Vitals and nursing note reviewed.  Constitutional:      General: He is not in acute distress.    Appearance: Normal appearance. He is well-developed. He is not ill-appearing.  HENT:     Head: Normocephalic and atraumatic.  Eyes:     General: No scleral icterus.    Extraocular Movements: Extraocular movements intact.     Conjunctiva/sclera: Conjunctivae normal.     Pupils: Pupils are equal, round, and reactive to light.  Cardiovascular:     Rate and Rhythm: Normal rate and regular rhythm.     Pulses: Normal pulses.     Heart sounds: Normal heart sounds. No murmur.   Pulmonary:     Effort: Pulmonary effort is normal. No respiratory distress.     Breath sounds: Normal breath sounds. No wheezing, rhonchi or rales.  Musculoskeletal:        General: Tenderness present.     Cervical back: Normal range of motion and neck supple.     Comments:  See HPI for foot exam if done Scarring to R foot  Lymphadenopathy:     Cervical: No cervical adenopathy.  Skin:    General: Skin is warm and dry.     Findings: No rash.  Neurological:     Mental Status: He is alert.  Psychiatric:        Mood and Affect: Mood normal.        Behavior: Behavior normal.       Results for orders placed or performed in visit on 06/15/19  POCT glycosylated hemoglobin (Hb A1C)  Result Value Ref Range   Hemoglobin A1C 5.9 (A) 4.0 - 5.6 %   HbA1c POC (<> result, manual entry)     HbA1c, POC (prediabetic range)     HbA1c, POC (controlled diabetic range)     Assessment & Plan:  This visit occurred during the SARS-CoV-2 public health emergency.  Safety protocols were in place, including screening questions prior to the visit, additional usage of staff PPE, and extensive cleaning of exam room while observing appropriate contact time as indicated for disinfecting solutions.   Problem List Items Addressed This Visit    Prediabetes - Primary    Has established healthy diet changes with subsequent return to prediabetes range sugars. Congratulated. Motivated to continue walking and juicing.  RTC 41mo CPE.       Obesity, Class I, BMI 30-34.9    Reviewed healthy diet and lifestyle changes recently implemented.           No orders of the defined types were placed in this encounter.  Orders Placed This Encounter  Procedures  . POCT glycosylated hemoglobin (Hb A1C)    Patient Instructions  Sugars are now back in prediabetes range - continue healthy lifestye and diet changes to date!  Congratulations! Return for physical next 03/2020, sooner if needed.   Follow up plan: Return in  about 9 months (around 03/14/2020) for annual exam, prior fasting for blood work, medicare wellness visit.  Ria Bush, MD

## 2019-06-15 NOTE — Assessment & Plan Note (Addendum)
Has established healthy diet changes with subsequent return to prediabetes range sugars. Congratulated. Motivated to continue walking and juicing.  RTC 67mo CPE.

## 2019-06-17 ENCOUNTER — Encounter: Payer: PPO | Admitting: Family Medicine

## 2019-07-21 DIAGNOSIS — Z8546 Personal history of malignant neoplasm of prostate: Secondary | ICD-10-CM | POA: Diagnosis not present

## 2019-07-27 DIAGNOSIS — N5231 Erectile dysfunction following radical prostatectomy: Secondary | ICD-10-CM | POA: Diagnosis not present

## 2019-07-27 DIAGNOSIS — Z8546 Personal history of malignant neoplasm of prostate: Secondary | ICD-10-CM | POA: Diagnosis not present

## 2019-07-27 DIAGNOSIS — N393 Stress incontinence (female) (male): Secondary | ICD-10-CM | POA: Diagnosis not present

## 2019-07-28 ENCOUNTER — Encounter: Payer: Self-pay | Admitting: Orthopedic Surgery

## 2019-07-28 ENCOUNTER — Ambulatory Visit (INDEPENDENT_AMBULATORY_CARE_PROVIDER_SITE_OTHER): Payer: PPO | Admitting: Orthopedic Surgery

## 2019-07-28 VITALS — Ht 68.5 in | Wt 223.2 lb

## 2019-07-28 DIAGNOSIS — M25571 Pain in right ankle and joints of right foot: Secondary | ICD-10-CM | POA: Diagnosis not present

## 2019-07-28 DIAGNOSIS — M24671 Ankylosis, right ankle: Secondary | ICD-10-CM

## 2019-07-28 MED ORDER — METHYLPREDNISOLONE ACETATE 40 MG/ML IJ SUSP
40.0000 mg | INTRAMUSCULAR | Status: AC | PRN
  Administered 2019-07-28: 40 mg via INTRA_ARTICULAR

## 2019-07-28 MED ORDER — LIDOCAINE HCL 1 % IJ SOLN
1.0000 mL | INTRAMUSCULAR | Status: AC | PRN
  Administered 2019-07-28: 1 mL
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
## 2019-08-07 ENCOUNTER — Telehealth: Payer: Self-pay | Admitting: Orthopedic Surgery

## 2019-08-07 NOTE — Telephone Encounter (Signed)
Geanie Berlin that records & bills were mailed directly to [facility] on 7/27

## 2019-08-20 ENCOUNTER — Telehealth: Payer: Self-pay | Admitting: Orthopedic Surgery

## 2019-08-20 NOTE — Telephone Encounter (Signed)
IC,lmvm Tiffany w/ Lynnell Catalan [phone] advising that I mailed records & bills on 7/27 (84 pages).

## 2019-12-02 ENCOUNTER — Other Ambulatory Visit: Payer: Self-pay | Admitting: Family Medicine

## 2019-12-02 NOTE — Telephone Encounter (Signed)
Meloxicam Last filled:  09/03/19, #90 Last OV:  06/15/19, 3-4 mo DM f/u Next OV:  none

## 2020-01-26 ENCOUNTER — Ambulatory Visit (INDEPENDENT_AMBULATORY_CARE_PROVIDER_SITE_OTHER): Payer: PPO | Admitting: Physician Assistant

## 2020-01-26 ENCOUNTER — Ambulatory Visit: Payer: Self-pay

## 2020-01-26 ENCOUNTER — Other Ambulatory Visit: Payer: Self-pay

## 2020-01-26 ENCOUNTER — Ambulatory Visit: Payer: PPO | Admitting: Orthopedic Surgery

## 2020-01-26 DIAGNOSIS — M24671 Ankylosis, right ankle: Secondary | ICD-10-CM

## 2020-01-26 DIAGNOSIS — Z8546 Personal history of malignant neoplasm of prostate: Secondary | ICD-10-CM | POA: Diagnosis not present

## 2020-01-27 ENCOUNTER — Encounter: Payer: Self-pay | Admitting: Physician Assistant
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
## 2020-01-29 ENCOUNTER — Ambulatory Visit (INDEPENDENT_AMBULATORY_CARE_PROVIDER_SITE_OTHER): Payer: PPO | Admitting: Family Medicine

## 2020-01-29 ENCOUNTER — Encounter: Payer: Self-pay | Admitting: Family Medicine

## 2020-01-29 ENCOUNTER — Telehealth: Payer: Self-pay | Admitting: Family Medicine

## 2020-01-29 ENCOUNTER — Other Ambulatory Visit: Payer: Self-pay

## 2020-01-29 VITALS — BP 126/72 | Ht 70.0 in | Wt 215.0 lb

## 2020-01-29 DIAGNOSIS — G8929 Other chronic pain: Secondary | ICD-10-CM

## 2020-01-29 DIAGNOSIS — M25571 Pain in right ankle and joints of right foot: Secondary | ICD-10-CM | POA: Diagnosis not present

## 2020-01-29 NOTE — Patient Instructions (Signed)
Try [place] or other maximally cushioned shoes with wide toe box that will hopefully provide stability around your ankle joint as well. Please bring your current shoes to the store for them to see.   Fleet feet or [employer] sports.   If you dont have any success with this--we can try '[place]" orthosis that we discussed.
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
## 2020-01-29 NOTE — Telephone Encounter (Signed)
Opened in error

## 2020-02-16 DIAGNOSIS — Z8546 Personal history of malignant neoplasm of prostate: Secondary | ICD-10-CM | POA: Diagnosis not present

## 2020-02-16 DIAGNOSIS — N5231 Erectile dysfunction following radical prostatectomy: Secondary | ICD-10-CM | POA: Diagnosis not present

## 2020-02-16 DIAGNOSIS — N393 Stress incontinence (female) (male): Secondary | ICD-10-CM | POA: Diagnosis not present

## 2020-04-12 DIAGNOSIS — N393 Stress incontinence (female) (male): Secondary | ICD-10-CM | POA: Diagnosis not present

## 2020-04-13 ENCOUNTER — Telehealth: Payer: Self-pay | Admitting: Orthopedic Surgery

## 2020-04-13 NOTE — Telephone Encounter (Signed)
Invoice for 3/22 deposition with [facility] Sauer emailed to [employer]. I will follow for payment.

## 2020-04-25 ENCOUNTER — Other Ambulatory Visit: Payer: Self-pay | Admitting: *Deleted

## 2020-04-25 MED ORDER — ATORVASTATIN CALCIUM 40 MG PO TABS: 40.0000 mg | ORAL_TABLET | Freq: Every day | ORAL | 0 refills | Status: DC

## 2020-06-06 DIAGNOSIS — S90929A Unspecified superficial injury of unspecified foot, initial encounter: Secondary | ICD-10-CM | POA: Diagnosis not present

## 2020-06-08 ENCOUNTER — Other Ambulatory Visit: Payer: Self-pay | Admitting: Family Medicine

## 2020-06-09 NOTE — Telephone Encounter (Signed)
Meloxicam Last filled:  03/11/20, #90 Last OV:  06/15/19, 3 mo DM f/u Next OV:  07/13/20, AWV prt 2

## 2020-07-07 ENCOUNTER — Ambulatory Visit: Payer: PPO

## 2020-07-07 ENCOUNTER — Telehealth: Payer: Self-pay

## 2020-07-07 ENCOUNTER — Other Ambulatory Visit: Payer: Self-pay | Admitting: Family Medicine

## 2020-07-07 DIAGNOSIS — R7303 Prediabetes: Secondary | ICD-10-CM

## 2020-07-07 DIAGNOSIS — C61 Malignant neoplasm of prostate: Secondary | ICD-10-CM

## 2020-07-07 DIAGNOSIS — E785 Hyperlipidemia, unspecified: Secondary | ICD-10-CM

## 2020-07-07 NOTE — Telephone Encounter (Signed)
Called patient to complete his AWV. 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 Appointment cancelled per patient request.

## 2020-07-08 ENCOUNTER — Ambulatory Visit: Payer: PPO

## 2020-07-08 ENCOUNTER — Other Ambulatory Visit: Payer: PPO

## 2020-07-13 ENCOUNTER — Encounter: Payer: PPO | Admitting: Family Medicine

## 2020-07-23 ENCOUNTER — Other Ambulatory Visit: Payer: Self-pay | Admitting: Family Medicine

## 2020-08-09 DIAGNOSIS — Z8546 Personal history of malignant neoplasm of prostate: Secondary | ICD-10-CM | POA: Diagnosis not present

## 2020-08-16 DIAGNOSIS — Z8546 Personal history of malignant neoplasm of prostate: Secondary | ICD-10-CM | POA: Diagnosis not present

## 2020-08-16 DIAGNOSIS — N393 Stress incontinence (female) (male): Secondary | ICD-10-CM | POA: Diagnosis not present

## 2020-08-16 DIAGNOSIS — N5231 Erectile dysfunction following radical prostatectomy: Secondary | ICD-10-CM | POA: Diagnosis not present

## 2020-09-15 DIAGNOSIS — N5231 Erectile dysfunction following radical prostatectomy: Secondary | ICD-10-CM | POA: Diagnosis not present

## 2020-09-21 ENCOUNTER — Other Ambulatory Visit: Payer: PPO

## 2020-09-28 ENCOUNTER — Ambulatory Visit (INDEPENDENT_AMBULATORY_CARE_PROVIDER_SITE_OTHER): Payer: PPO | Admitting: Family Medicine

## 2020-09-28 ENCOUNTER — Encounter: Payer: Self-pay | Admitting: Family Medicine

## 2020-09-28 ENCOUNTER — Other Ambulatory Visit: Payer: Self-pay

## 2020-09-28 VITALS — BP 124/70 | HR 64 | Temp 97.7°F | Ht 68.5 in | Wt 211.2 lb

## 2020-09-28 DIAGNOSIS — Z23 Encounter for immunization: Secondary | ICD-10-CM | POA: Diagnosis not present

## 2020-09-28 DIAGNOSIS — Z7189 Other specified counseling: Secondary | ICD-10-CM

## 2020-09-28 DIAGNOSIS — R7303 Prediabetes: Secondary | ICD-10-CM

## 2020-09-28 DIAGNOSIS — Z0000 Encounter for general adult medical examination without abnormal findings: Secondary | ICD-10-CM

## 2020-09-28 DIAGNOSIS — L989 Disorder of the skin and subcutaneous tissue, unspecified: Secondary | ICD-10-CM | POA: Diagnosis not present

## 2020-09-28 DIAGNOSIS — N529 Male erectile dysfunction, unspecified: Secondary | ICD-10-CM

## 2020-09-28 DIAGNOSIS — N281 Cyst of kidney, acquired: Secondary | ICD-10-CM

## 2020-09-28 DIAGNOSIS — E669 Obesity, unspecified: Secondary | ICD-10-CM

## 2020-09-28 DIAGNOSIS — E785 Hyperlipidemia, unspecified: Secondary | ICD-10-CM

## 2020-09-28 DIAGNOSIS — N138 Other obstructive and reflux uropathy: Secondary | ICD-10-CM

## 2020-09-28 DIAGNOSIS — N401 Enlarged prostate with lower urinary tract symptoms: Secondary | ICD-10-CM

## 2020-09-28 DIAGNOSIS — C61 Malignant neoplasm of prostate: Secondary | ICD-10-CM | POA: Diagnosis not present

## 2020-09-28 DIAGNOSIS — E66811 Obesity, class 1: Secondary | ICD-10-CM

## 2020-09-28 MED ORDER — ATORVASTATIN CALCIUM 40 MG PO TABS: 40.0000 mg | ORAL_TABLET | Freq: Every day | ORAL | 3 refills | Status: DC

## 2020-09-28 NOTE — Assessment & Plan Note (Addendum)
Chronic, stable on atorvastatin 40mg  daily - update fasting lipid panel.  The 10-year ASCVD risk score (Arnett DK, et al., 2019) is: 21.2%   Values used to calculate the score:     Age: 70 years     Sex: Male     Is Non-Hispanic African American: Yes     Diabetic: Yes     Tobacco smoker: No     Systolic Blood Pressure: 643 mmHg     Is BP treated: No     HDL Cholesterol: 38 mg/dL     Total Cholesterol: 180 mg/dL

## 2020-09-28 NOTE — Assessment & Plan Note (Signed)
Very tender papule to L 3rd digit, point tender to palpation. May have started after insect sting. Suggested trial OTC topical steroid.

## 2020-09-28 NOTE — Assessment & Plan Note (Signed)
Now on intracavernous injections, followed by urology

## 2020-09-28 NOTE — Assessment & Plan Note (Signed)
Sees urology in h/o prostate cancer.

## 2020-09-28 NOTE — Assessment & Plan Note (Addendum)
Latest renal US 06/2018 - simple appearing R renal cyst.

## 2020-09-28 NOTE — Assessment & Plan Note (Signed)
Appreciate uro care. S/p robotic prostatectomy 08/2018. PSA levels remain undetectable.

## 2020-09-28 NOTE — Assessment & Plan Note (Signed)
Advanced directive discussion - Has packet at home - asked to bring [place] copy. Would want daughter Juan Hunt to be HCPOA.

## 2020-09-28 NOTE — Assessment & Plan Note (Signed)
Weight loss noted over the past year.

## 2020-09-28 NOTE — Assessment & Plan Note (Signed)

## 2020-09-28 NOTE — Patient Instructions (Addendum)
We will cancel Saturday's phone call  Labs today  Flu shot today  If interested, check with pharmacy about new 2 shot shingles series (shingrix).  Try over the counter Cortizone-10 cream to spot on finger.  Work on advanced directive and bring me a copy when completed.  Return as needed or in 1 year for next physical/wellness visit.   Health Maintenance After Age 70 After age 71, you are at a higher risk for certain long-term diseases and infections as well as injuries from falls. Falls are a major cause of broken bones and head injuries in people who are older than age 52. Getting regular preventive care can help to keep you healthy and well. Preventive care includes getting regular testing and making lifestyle changes as recommended by your health care provider. Talk with your health care provider about: Which screenings and tests you should have. A screening is a test that checks for a disease when you have no symptoms. A diet and exercise plan that is right for you. What should I know about screenings and tests to prevent falls? Screening and testing are the best ways to find a health problem early. Early diagnosis and treatment give you the best chance of managing medical conditions that are common after age 13. Certain conditions and lifestyle choices may make you more likely to have a fall. Your health care provider may recommend: Regular vision checks. Poor vision and conditions such as cataracts can make you more likely to have a fall. If you wear glasses, make sure to get your prescription updated if your vision changes. Medicine review. Work with your health care provider to regularly review all of the medicines you are taking, including over-the-counter medicines. Ask your health care provider about any side effects that may make you more likely to have a fall. Tell your health care provider if any medicines that you take make you feel dizzy or sleepy. Osteoporosis screening. Osteoporosis  is a condition that causes the bones to get weaker. This can make the bones weak and cause them to break more easily. Blood pressure screening. Blood pressure changes and medicines to control blood pressure can make you feel dizzy. Strength and balance checks. Your health care provider may recommend certain tests to check your strength and balance while standing, walking, or changing positions. Foot health exam. Foot pain and numbness, as well as not wearing proper footwear, can make you more likely to have a fall. Depression screening. You may be more likely to have a fall if you have a fear of falling, feel emotionally low, or feel unable to do activities that you used to do. Alcohol use screening. Using too much alcohol can affect your balance and may make you more likely to have a fall. What actions can I take to lower my risk of falls? General instructions Talk with your health care provider about your risks for falling. Tell your health care provider if: You fall. Be sure to tell your health care provider about all falls, even ones that seem minor. You feel dizzy, sleepy, or off-balance. Take over-the-counter and prescription medicines only as told by your health care provider. These include any supplements. Eat a healthy diet and maintain a healthy weight. A healthy diet includes low-fat dairy products, low-fat (lean) meats, and fiber from whole grains, beans, and lots of fruits and vegetables. Home safety Remove any tripping hazards, such as rugs, cords, and clutter. Install safety equipment such as grab bars in bathrooms and safety rails  on stairs. Keep rooms and walkways well-lit. Activity  Follow a regular exercise program to stay fit. This will help you maintain your balance. Ask your health care provider what types of exercise are appropriate for you. If you need a cane or walker, use it as recommended by your health care provider. Wear supportive shoes that have nonskid  soles. Lifestyle Do not drink alcohol if your health care provider tells you not to drink. If you drink alcohol, limit how much you have: 0-1 drink a day for women. 0-2 drinks a day for men. Be aware of how much alcohol is in your drink. In the [place] one drink equals one typical bottle of beer (12 oz), one-half glass of wine (5 oz), or one shot of hard liquor (1 oz). Do not use any products that contain nicotine or tobacco, such as cigarettes and e-cigarettes. If you need help quitting, ask your health care provider. Summary Having a healthy lifestyle and getting preventive care can help to protect your health and wellness after age 41. Screening and testing are the best way to find a health problem early and help you avoid having a fall. Early diagnosis and treatment give you the best chance for managing medical conditions that are more common for people who are older than age 14. Falls are a major cause of broken bones and head injuries in people who are older than age 42. Take precautions to prevent a fall at home. Work with your health care provider to learn what changes you can make to improve your health and wellness and to prevent falls. This information is not intended to replace advice given to you by your health care provider. Make sure you discuss any questions you have with your health care provider. Document Revised: 03/04/2020 Document Reviewed: 12/11/2019 Elsevier Patient Education  2022 [employer].

## 2020-09-28 NOTE — Progress Notes (Signed)
Patient ID: Juan Hunt, male    DOB: April 20, 1950, 70 y.o.   MRN: [id number]  This visit was conducted in person.  BP 124/70   Pulse 64   Temp 97.7 F (36.5 C) (Temporal)   Ht 5' 8.5" (1.74 m)   Wt 211 lb 3 oz (95.8 kg)   SpO2 96%   BMI 31.64 kg/m    CC: AMW/CPE Subjective:   HPI: Juan Hunt is a 70 y.o. male presenting on 09/28/2020 for Medicare Wellness   Did not see health advisor this year.   Hearing Screening   500Hz  1000Hz  2000Hz  4000Hz   Right ear 20 20 20 20   Left ear 25 25 20 20   Vision Screening - Comments:: Last eye exam, 04/2020.  [facility] Visit from 09/28/2020 in [place] at [place]  PHQ-2 Total Score 0       Fall Risk  09/28/2020 03/11/2019 06/06/2018 04/02/2017 08/16/2015  Falls in the past year? 0 0 0 Yes Yes  Comment - - - 4 falls off of knee scooter; denies balance issue -  Number falls in past yr: - - - 2 or more 1  Injury with Fall? - - - Yes Yes  Comment - - - hurt right foot; laceration to toe -  Risk for fall due to : - - - Impaired mobility -    ED - followed by urology - receiving Triminix injections.  Painful bump to left 3rd finger that developed after ?insect sting  Preventative: COLONOSCOPY Date: 04/2013 2 TA, mod diverticulosis, rpt 5 yrs (Pyrtle) COLONOSCOPY 06/2018 - diverticulosis, rpt 52yr (Pyrtle)  Low risk prostate cancer - followed by urology Dr Louis Meckel s/p robotic assisted lap prostatectomy 08/2018. Some ongoing urinary incontinence. PSA undetectable. Seeing Q6 mo.  Lung cancer screening - Not eligible. Ex smoker quit 1999 - smoked 1/2 ppd for about 12 yrs.  Flu shot - yearly  Moderna covid vaccine 01/2019, 02/2019, [place] boosters 11/2019, 04/2020  Pneumovax 10/2016  Tdap 02/2014 Zostavax - 2014 Shingrix - discussed, to check at pharmacy  Advanced directive discussion - Has packet at home - asked to bring [place] copy. Would want daughter [name] to be HCPOA.  Seat belt use discussed  Sunscreen use  discussed, no changing moles on skin  Sleep - averaging 7 hours/night Ex smoker - quit 1999 Alcohol - none Dentist - q6 mo Eye exam - yearly - monitoring borderline pressures Bowel - no constipation  Bladder - ongoing leaking/incontinence since prostate surgery - uses 2 depends/day.   Lives with wife Grown children [employer] Occupation: retired Development worker, community, still does some plumbing work.   Edu: HS  Activity: [employer] 2x/wk  Diet: some water, fruits/vegetables daily, working on [employer]      Relevant past medical, surgical, family and social history reviewed and updated as indicated. Interim medical history since our last visit reviewed. Allergies and medications reviewed and updated. Outpatient Medications Prior to Visit  Medication Sig Dispense Refill   Capsicum-Garlic (CAYENNE PLUS GARLIC PO) Take 1 capsule by mouth daily.     Cholecalciferol (VITAMIN D3) 250 MCG (10000 UT) TABS Take 10,000 Units by mouth daily.     docusate sodium (COLACE) 50 MG capsule Take 50 mg by mouth daily as needed for mild constipation.     meloxicam (MOBIC) 15 MG tablet TAKE 1 TABLET BY MOUTH EVERY DAY 90 tablet 1   traMADol (ULTRAM) 50 MG tablet Take 1-2 tablets (50-100 mg total) by  mouth every 6 (six) hours as needed for moderate pain or severe pain. 20 tablet 0   trolamine salicylate (ASPERCREME) 10 % cream Apply 1 application topically as needed for muscle pain.     Wheat Dextrin (BENEFIBER PO) Take 1 Scoop by mouth daily.     atorvastatin (LIPITOR) 40 MG tablet TAKE 1 TABLET BY MOUTH EVERY DAY 90 tablet 0   sildenafil (REVATIO) 20 MG tablet Take 2-5 tablets (40-100 mg total) by mouth daily as needed (relations). 150 tablet 1   No facility-administered medications prior to visit.     Per HPI unless specifically indicated in ROS section below Review of Systems  Constitutional:  Negative for activity change, appetite change, chills, fatigue, fever and unexpected weight  change.  HENT:  Negative for hearing loss.   Eyes:  Negative for visual disturbance.  Respiratory:  Negative for cough, chest tightness, shortness of breath and wheezing.   Cardiovascular:  Negative for chest pain, palpitations and leg swelling.  Gastrointestinal:  Negative for abdominal distention, abdominal pain, blood in stool, constipation, diarrhea, nausea and vomiting.  Genitourinary:  Negative for difficulty urinating and hematuria.  Musculoskeletal:  Negative for arthralgias, myalgias and neck pain.  Skin:  Negative for rash.  Neurological:  Negative for dizziness, seizures, syncope and headaches.  Hematological:  Negative for adenopathy. Does not bruise/bleed easily.  Psychiatric/Behavioral:  Negative for dysphoric mood. The patient is not nervous/anxious.    Objective:  BP 124/70   Pulse 64   Temp 97.7 F (36.5 C) (Temporal)   Ht 5' 8.5" (1.74 m)   Wt 211 lb 3 oz (95.8 kg)   SpO2 96%   BMI 31.64 kg/m   Wt Readings from Last 3 Encounters:  09/28/20 211 lb 3 oz (95.8 kg)  01/29/20 215 lb (97.5 kg)  07/28/19 223 lb 3 oz (101.2 kg)      Physical Exam Vitals and nursing note reviewed.  Constitutional:      General: He is not in acute distress.    Appearance: Normal appearance. He is well-developed. He is not ill-appearing.  HENT:     Head: Normocephalic and atraumatic.     Right Ear: Hearing, tympanic membrane, ear canal and external ear normal.     Left Ear: Hearing, tympanic membrane, ear canal and external ear normal.  Eyes:     General: No scleral icterus.    Extraocular Movements: Extraocular movements intact.     Conjunctiva/sclera: Conjunctivae normal.     Pupils: Pupils are equal, round, and reactive to light.  Neck:     Thyroid: No thyroid mass or thyromegaly.     Vascular: No carotid bruit.  Cardiovascular:     Rate and Rhythm: Normal rate and regular rhythm.     Pulses: Normal pulses.          Radial pulses are 2+ on the right side and 2+ on the left  side.     Heart sounds: Normal heart sounds. No murmur heard. Pulmonary:     Effort: Pulmonary effort is normal. No respiratory distress.     Breath sounds: Normal breath sounds. No wheezing, rhonchi or rales.  Abdominal:     General: Bowel sounds are normal. There is no distension.     Palpations: Abdomen is soft. There is no mass.     Tenderness: There is no abdominal tenderness. There is no guarding or rebound.     Hernia: No hernia is present.  Musculoskeletal:        General:  Normal range of motion.     Cervical back: Normal range of motion and neck supple.     Right lower leg: No edema.     Left lower leg: No edema.  Lymphadenopathy:     Cervical: No cervical adenopathy.  Skin:    General: Skin is warm and dry.     Findings: No rash.  Neurological:     General: No focal deficit present.     Mental Status: He is alert and oriented to person, place, and time.     Comments:  Recall 3/3 Calculation 3/5 DLORW  Psychiatric:        Mood and Affect: Mood normal.        Behavior: Behavior normal.        Thought Content: Thought content normal.        Judgment: Judgment normal.      Results for orders placed or performed in visit on 06/15/19  POCT glycosylated hemoglobin (Hb A1C)  Result Value Ref Range   Hemoglobin A1C 5.9 (A) 4.0 - 5.6 %   HbA1c POC (<> result, manual entry)     HbA1c, POC (prediabetic range)     HbA1c, POC (controlled diabetic range)      Assessment & Plan:  This visit occurred during the SARS-CoV-2 public health emergency.  Safety protocols were in place, including screening questions prior to the visit, additional usage of staff PPE, and extensive cleaning of exam room while observing appropriate contact time as indicated for disinfecting solutions.   Problem List Items Addressed This Visit     Health maintenance examination (Chronic)    Preventative protocols reviewed and updated unless pt declined. Discussed healthy diet and lifestyle.        Advanced care planning/counseling discussion (Chronic)    Advanced directive discussion - Has packet at home - asked to bring [place] copy. Would want daughter [name] to be HCPOA.       Medicare annual wellness visit, subsequent - Primary (Chronic)    I have personally reviewed the Medicare Annual Wellness questionnaire and have noted 1. The patient's medical and social history 2. Their use of alcohol, tobacco or illicit drugs 3. Their current medications and supplements 4. The patient's functional ability including ADL's, fall risks, home safety risks and hearing or visual impairment. Cognitive function has been assessed and addressed as indicated.  5. Diet and physical activity 6. Evidence for depression or mood disorders The patients weight, height, BMI have been recorded in the chart. I have made referrals, counseling and provided education to the patient based on review of the above and I have provided the pt with a written personalized care plan for preventive services. Provider list updated.. See scanned questionairre as needed for further documentation. Reviewed preventative protocols and updated unless pt declined.       Prediabetes    Update levels.      Hyperlipidemia    Chronic, stable on atorvastatin 40mg  daily - update fasting lipid panel.  The 10-year ASCVD risk score (Arnett DK, et al., 2019) is: 21.2%   Values used to calculate the score:     Age: 82 years     Sex: Male     Is Non-Hispanic African American: Yes     Diabetic: Yes     Tobacco smoker: No     Systolic Blood Pressure: 093 mmHg     Is BP treated: No     HDL Cholesterol: 38 mg/dL     Total Cholesterol: 180  mg/dL       Relevant Medications   atorvastatin (LIPITOR) 40 MG tablet   Obesity, Class I, BMI 30-34.9    Weight loss noted over the past year.       Renal cyst, acquired, right    Latest renal US 06/2018 - simple appearing R renal cyst.       BPH with obstruction/lower urinary tract symptoms     Sees urology in h/o prostate cancer.       Erectile dysfunction of organic origin    Now on intracavernous injections, followed by urology      Prostate cancer [facility])    Appreciate uro care. S/p robotic prostatectomy 08/2018. PSA levels remain undetectable.       Finger lesion    Very tender papule to L 3rd digit, point tender to palpation. May have started after insect sting. Suggested trial OTC topical steroid.       Other Visit Diagnoses     Need for influenza vaccination       Relevant Orders   Flu Vaccine QUAD High Dose(Fluad) (Completed)        Meds ordered this encounter  Medications   atorvastatin (LIPITOR) 40 MG tablet    Sig: Take 1 tablet (40 mg total) by mouth daily.    Dispense:  90 tablet    Refill:  3   Orders Placed This Encounter  Procedures   Flu Vaccine QUAD High Dose(Fluad)    Patient instructions: We will cancel Saturday's phone call  Labs today  Flu shot today  If interested, check with pharmacy about new 2 shot shingles series (shingrix).  Try over the counter Cortizone-10 cream to spot on finger.  Work on advanced directive and bring me a copy when completed.  Return as needed or in 1 year for next physical/wellness visit.   Follow up plan: Return in about 1 year (around 09/28/2021) for annual exam, prior fasting for blood work, medicare wellness visit.  Ria Bush, MD

## 2020-09-28 NOTE — Assessment & Plan Note (Signed)
Update levels. 

## 2020-09-28 NOTE — Assessment & Plan Note (Signed)
Preventative protocols reviewed and updated unless pt declined. Discussed healthy diet and lifestyle.  

## 2020-09-29 LAB — LIPID PANEL
Cholesterol: 182 mg/dL (ref 0–200)
HDL: 42.9 mg/dL (ref >39.00)
LDL Cholesterol: 120 mg/dL — ABNORMAL HIGH (ref 0–99)
NonHDL: 138.72
Total CHOL/HDL Ratio: 4
Triglycerides: 94 mg/dL (ref 0.0–149.0)
VLDL: 18.8 mg/dL (ref 0.0–40.0)

## 2020-09-29 LAB — COMPREHENSIVE METABOLIC PANEL
ALT: 25 U/L (ref 0–53)
AST: 26 U/L (ref 0–37)
Albumin: 4.4 g/dL (ref 3.5–5.2)
Alkaline Phosphatase: 53 U/L (ref 39–117)
BUN: 21 mg/dL (ref 6–23)
CO2: 30 mEq/L (ref 19–32)
Calcium: 9.8 mg/dL (ref 8.4–10.5)
Chloride: 104 mEq/L (ref 96–112)
Creatinine, Ser: 1.04 mg/dL (ref 0.40–1.50)
GFR: 72.77 mL/min (ref >60.00)
Glucose, Bld: 82 mg/dL (ref 70–99)
Potassium: 4.4 mEq/L (ref 3.5–5.1)
Sodium: 141 mEq/L (ref 135–145)
Total Bilirubin: 0.7 mg/dL (ref 0.2–1.2)
Total Protein: 7.6 g/dL (ref 6.0–8.3)

## 2020-09-29 LAB — CBC WITH DIFFERENTIAL/PLATELET
Basophils Absolute: 0 10*3/uL (ref 0.0–0.1)
Basophils Relative: 0.9 % (ref 0.0–3.0)
Eosinophils Absolute: 0.1 10*3/uL (ref 0.0–0.7)
Eosinophils Relative: 1.1 % (ref 0.0–5.0)
HCT: 43.8 % (ref 39.0–52.0)
Hemoglobin: 13.9 g/dL (ref 13.0–17.0)
Lymphocytes Relative: 45.1 % (ref 12.0–46.0)
Lymphs Abs: 2 10*3/uL (ref 0.7–4.0)
MCHC: 31.8 g/dL (ref 30.0–36.0)
MCV: 83.6 fl (ref 78.0–100.0)
Monocytes Absolute: 0.5 10*3/uL (ref 0.1–1.0)
Monocytes Relative: 10.2 % (ref 3.0–12.0)
Neutro Abs: 1.9 10*3/uL (ref 1.4–7.7)
Neutrophils Relative %: 42.7 % — ABNORMAL LOW (ref 43.0–77.0)
Platelets: 168 10*3/uL (ref 150.0–400.0)
RBC: 5.24 Mil/uL (ref 4.22–5.81)
RDW: 14.7 % (ref 11.5–15.5)
WBC: 4.4 10*3/uL (ref 4.0–10.5)

## 2020-09-29 LAB — PSA: PSA: 0 ng/mL — ABNORMAL LOW (ref 0.10–4.00)

## 2020-09-29 LAB — HEMOGLOBIN A1C: Hgb A1c MFr Bld: 6.2 % (ref 4.6–6.5)

## 2020-10-01 ENCOUNTER — Ambulatory Visit: Payer: PPO

## 2020-12-07 ENCOUNTER — Telehealth: Payer: Self-pay

## 2020-12-07 NOTE — Telephone Encounter (Signed)
Noted, patient added to list.

## 2020-12-07 NOTE — Telephone Encounter (Signed)
Please notify patient that we have free diabetic eye exams on 12/14/20 at the [place] office from 9a-4p.  This doesn't replace a normal eye glasses or contact exam, but it's to evaluate for damage done from diabetes which is recommended annually.  If he's interested, let me know what time he'd like to come in. Appointments run every 20 min starting at 9 am.

## 2020-12-07 NOTE — Telephone Encounter (Signed)
[place] Night - Client Nonclinical Telephone Record  [facility] Client [facility] Night - Client Client Site [place] - Night Provider Alma Friendly - NP Contact Type Call Who Is Calling Patient / Member / Family / Caregiver Caller Name [place] Phone Number [phone] Call Type Message Only Information Provided Reason for Call Returning a Call from the Office Initial [place] returning a missed a call to make an appt for diabetic eye exam. Disp. Time Disposition Final User 12/06/2020 5:54:39 PM General Information Provided Yes Kathlynn Grate Call Closed By: Kathlynn Grate Transaction Date/Time: 12/06/2020 5:52:26 PM (ET

## 2020-12-07 NOTE — Telephone Encounter (Signed)
Spoke with pt about DM eye exam.  Agrees to come in at 10:00.  I provided address for [place] office.

## 2021-02-14 DIAGNOSIS — Z8546 Personal history of malignant neoplasm of prostate: Secondary | ICD-10-CM | POA: Diagnosis not present

## 2021-02-21 DIAGNOSIS — Z8546 Personal history of malignant neoplasm of prostate: Secondary | ICD-10-CM | POA: Diagnosis not present

## 2021-02-21 DIAGNOSIS — N5231 Erectile dysfunction following radical prostatectomy: Secondary | ICD-10-CM | POA: Diagnosis not present

## 2021-02-21 DIAGNOSIS — N393 Stress incontinence (female) (male): Secondary | ICD-10-CM | POA: Diagnosis not present

## 2021-03-21 DIAGNOSIS — R350 Frequency of micturition: Secondary | ICD-10-CM | POA: Diagnosis not present

## 2021-03-21 DIAGNOSIS — N3946 Mixed incontinence: Secondary | ICD-10-CM | POA: Diagnosis not present

## 2021-04-10 ENCOUNTER — Encounter: Payer: Self-pay | Admitting: Family Medicine

## 2021-04-10 ENCOUNTER — Ambulatory Visit (INDEPENDENT_AMBULATORY_CARE_PROVIDER_SITE_OTHER): Payer: PPO | Admitting: Family Medicine

## 2021-04-10 VITALS — BP 140/68 | HR 65 | Temp 97.5°F | Ht 68.5 in | Wt 219.5 lb

## 2021-04-10 DIAGNOSIS — R7303 Prediabetes: Secondary | ICD-10-CM | POA: Diagnosis not present

## 2021-04-10 DIAGNOSIS — Z23 Encounter for immunization: Secondary | ICD-10-CM | POA: Diagnosis not present

## 2021-04-10 DIAGNOSIS — E66811 Obesity, class 1: Secondary | ICD-10-CM

## 2021-04-10 DIAGNOSIS — M79672 Pain in left foot: Secondary | ICD-10-CM | POA: Diagnosis not present

## 2021-04-10 DIAGNOSIS — E669 Obesity, unspecified: Secondary | ICD-10-CM | POA: Diagnosis not present

## 2021-04-10 LAB — POCT GLYCOSYLATED HEMOGLOBIN (HGB A1C): Hemoglobin A1C: 5.9 % — AB (ref 4.0–5.6)

## 2021-04-10 NOTE — Assessment & Plan Note (Signed)
Chronic over 8 months, ?plantar fasciitis.  ?Will refer to podiatry given chronicity of pain.  ?

## 2021-04-10 NOTE — Assessment & Plan Note (Signed)
Form filled out for upcoming health retreat in [place].  ?Reviewed healthy diet and lifestyle choices.  ?Goal weight 185-190 lbs.  ?

## 2021-04-10 NOTE — Patient Instructions (Addendum)
Form filled out today  ?Second and final pneumonia shot today (prevnar-20)  ?If interested, check with pharmacy about new 2 shot shingles series (shingrix).  ?Good to see you today.  ?We will refer you to foot doctor as well.  ?

## 2021-04-10 NOTE — Assessment & Plan Note (Signed)
Update POC A1c today.  ?No indication for Umicroalbumin at this time.  ?

## 2021-04-10 NOTE — Progress Notes (Signed)
? ? Patient ID: BAINE DECESARE, male    DOB: 10-06-1950, 71 y.o.   MRN: [id number] ? ?This visit was conducted in person. ? ?BP 140/68   Pulse 65   Temp (!) 97.5 ?F (36.4 ?C) (Temporal)   Ht 5' 8.5" (1.74 m)   Wt 219 lb 8 oz (99.6 kg)   SpO2 97%   BMI 32.89 kg/m?   ?BP Readings from Last 3 Encounters:  ?04/10/21 140/68  ?09/28/20 124/70  ?01/29/20 126/72  ? ?CC: 6 mo f/u visit  ?Subjective:  ? ?HPI: ?GUNNER IODICE is a 71 y.o. male presenting on 04/10/2021 for Follow-up (Wants urine, malb and pneumonia shot. Needs form completed for [place] eval. ) and Foot Pain (C/o bilateral foot pain in heel, worse in left. ) ? ? ?Retired Development worker, community - continues working part time  ? ?Wants to focus on health. He's cut out beef, avoids pork.  ?Planning to go through health retreat center in [place] in June 2023. To spend 30 days there. [place] started walking regularly, is drinking water regularly. Has stopped cow milk, drinking almond milk.  ? ?Here to discuss overdue health maintenance - urine microalbumin, shingrix vaccine, penumonia vaccine. S/p pneumovax-23 10/2016.  ? ?Overall feeling well.  ?Averaging 7 hours/night of sleep.  ?Continued urinary incontinence issues - predominantly leaking since prostate removed. Sees Dr Louis Meckel as well as Dr Matilde Sprang incontinence specialist.  ?   ? ?Relevant past medical, surgical, family and social history reviewed and updated as indicated. Interim medical history since our last visit reviewed. ?Allergies and medications reviewed and updated. ?Outpatient Medications Prior to Visit  ?Medication Sig Dispense Refill  ? atorvastatin (LIPITOR) 40 MG tablet Take 1 tablet (40 mg total) by mouth daily. 90 tablet 3  ? Capsicum-Garlic (CAYENNE PLUS GARLIC PO) Take 1 capsule by mouth daily.    ? Cholecalciferol (VITAMIN D3) 250 MCG (10000 UT) TABS Take 10,000 Units by mouth daily.    ? docusate sodium (COLACE) 50 MG capsule Take 50 mg by mouth daily  as needed for mild constipation.    ? meloxicam (MOBIC) 15 MG tablet TAKE 1 TABLET BY MOUTH EVERY DAY 90 tablet 1  ? traMADol (ULTRAM) 50 MG tablet Take 1-2 tablets (50-100 mg total) by mouth every 6 (six) hours as needed for moderate pain or severe pain. 20 tablet 0  ? trolamine salicylate (ASPERCREME) 10 % cream Apply 1 application topically as needed for muscle pain.    ? Wheat Dextrin (BENEFIBER PO) Take 1 Scoop by mouth daily.    ? ?No facility-administered medications prior to visit.  ?  ? ?Per HPI unless specifically indicated in ROS section below ?Review of Systems ? ?Objective:  ?BP 140/68   Pulse 65   Temp (!) 97.5 ?F (36.4 ?C) (Temporal)   Ht 5' 8.5" (1.74 m)   Wt 219 lb 8 oz (99.6 kg)   SpO2 97%   BMI 32.89 kg/m?   ?Wt Readings from Last 3 Encounters:  ?04/10/21 219 lb 8 oz (99.6 kg)  ?09/28/20 211 lb 3 oz (95.8 kg)  ?01/29/20 215 lb (97.5 kg)  ?  ?  ?Physical Exam ?Vitals and nursing note reviewed.  ?Constitutional:   ?   Appearance: Normal appearance. He is obese. He is not ill-appearing.  ?Cardiovascular:  ?   Rate and Rhythm: Normal rate and regular rhythm.  ?   Pulses: Normal pulses.  ?   Heart sounds: Normal heart  sounds. No murmur heard. ?Pulmonary:  ?   Effort: Pulmonary effort is normal. No respiratory distress.  ?   Breath sounds: Normal breath sounds. No wheezing, rhonchi or rales.  ?Musculoskeletal:     ?   General: Tenderness present. Normal range of motion.  ?   Right lower leg: No edema.  ?   Left lower leg: No edema.  ?   Comments: Point tender to palpation R heel at inferiomedial calcaneus, neg calc squeeze test, no ligament laxity, no pain at achilles  ?Skin: ?   General: Skin is warm and dry.  ?   Findings: No rash.  ?Neurological:  ?   Mental Status: He is alert.  ?Psychiatric:     ?   Mood and Affect: Mood normal.     ?   Behavior: Behavior normal.  ? ?   ?Results for orders placed or performed in visit on 04/10/21  ?POCT glycosylated hemoglobin (Hb A1C)  ?Result Value Ref  Range  ? Hemoglobin A1C 5.9 (A) 4.0 - 5.6 %  ? HbA1c POC (<> result, manual entry)    ? HbA1c, POC (prediabetic range)    ? HbA1c, POC (controlled diabetic range)    ? ? ?Assessment & Plan:  ? ?Problem List Items Addressed This Visit   ? ? Prediabetes  ?  Update POC A1c today.  ?No indication for Umicroalbumin at this time.  ?  ?  ? Relevant Orders  ? POCT glycosylated hemoglobin (Hb A1C) (Completed)  ? Obesity, Class I, BMI 30-34.9  ?  Form filled out for upcoming health retreat in [place].  ?Reviewed healthy diet and lifestyle choices.  ?Goal weight 185-190 lbs.  ?  ?  ? Left foot pain - Primary  ?  Chronic over 8 months, ?plantar fasciitis.  ?Will refer to podiatry given chronicity of pain.  ?  ?  ? Relevant Orders  ? Ambulatory referral to Podiatry  ? ?Other Visit Diagnoses   ? ? Need for vaccination against Streptococcus pneumoniae      ? Relevant Orders  ? Pneumococcal conjugate vaccine 20-valent (Completed)  ? ?  ?  ? ?No orders of the defined types were placed in this encounter. ? ?Orders Placed This Encounter  ?Procedures  ? Pneumococcal conjugate vaccine 20-valent  ? Ambulatory referral to Podiatry  ?  Referral Priority:   Routine  ?  Referral Type:   Consultation  ?  Referral Reason:   Specialty Services Required  ?  Requested Specialty:   Podiatry  ?  Number of Visits Requested:   1  ? POCT glycosylated hemoglobin (Hb A1C)  ? ? ? ?Patient Instructions  ?Form filled out today  ?Second and final pneumonia shot today (prevnar-20)  ?If interested, check with pharmacy about new 2 shot shingles series (shingrix).  ?Good to see you today.  ?We will refer you to foot doctor as well.  ? ?Follow up plan: ?Return if symptoms worsen or fail to improve. ? ?Ria Bush, MD   ?

## 2021-04-19 DIAGNOSIS — N3946 Mixed incontinence: Secondary | ICD-10-CM | POA: Diagnosis not present

## 2021-04-28 DIAGNOSIS — R350 Frequency of micturition: Secondary | ICD-10-CM | POA: Diagnosis not present

## 2021-04-28 DIAGNOSIS — N3946 Mixed incontinence: Secondary | ICD-10-CM | POA: Diagnosis not present

## 2021-05-01 ENCOUNTER — Ambulatory Visit: Payer: PPO | Admitting: Podiatry

## 2021-05-01 ENCOUNTER — Ambulatory Visit (INDEPENDENT_AMBULATORY_CARE_PROVIDER_SITE_OTHER): Payer: PPO

## 2021-05-01 DIAGNOSIS — M778 Other enthesopathies, not elsewhere classified: Secondary | ICD-10-CM

## 2021-05-01 MED ORDER — METHYLPREDNISOLONE 4 MG PO TBPK: ORAL_TABLET | ORAL | 0 refills | Status: DC
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
## 2021-05-31 ENCOUNTER — Ambulatory Visit: Payer: PPO | Admitting: Podiatry

## 2021-05-31 ENCOUNTER — Encounter: Payer: Self-pay | Admitting: Podiatry

## 2021-05-31 DIAGNOSIS — M722 Plantar fascial fibromatosis: Secondary | ICD-10-CM | POA: Diagnosis not present

## 2021-05-31 MED ORDER — TRIAMCINOLONE ACETONIDE 40 MG/ML IJ SUSP
20.0000 mg | Freq: Once | INTRAMUSCULAR | Status: AC
  Administered 2021-05-31: 20 mg

## 2021-05-31 NOTE — Progress Notes (Signed)
He presents today states that 80 to 90% improved is doing much better she refers to the Planter fasciitis of his left heel.  Objective: Vital signs are stable he is alert and oriented x3.  Pulses are palpable.  Mild reproducible pain on palpation medial calcaneal tubercle of the left heel.  Assessment: Pain in limb secondary to Planter fasciitis left.  Plan: Discussed etiology pathology conservative therapies at this point performed a injection to the medial heel at the plantar fascia calcaneal insertion site he will continue the use of his meloxicam and plantar fascia brace we will follow-up with me in 1 month if necessary.

## 2021-06-08 DIAGNOSIS — N3946 Mixed incontinence: Secondary | ICD-10-CM | POA: Diagnosis not present

## 2021-07-12 ENCOUNTER — Ambulatory Visit: Payer: PPO | Admitting: Podiatry

## 2021-07-21 DIAGNOSIS — R350 Frequency of micturition: Secondary | ICD-10-CM | POA: Diagnosis not present

## 2021-07-21 DIAGNOSIS — N3946 Mixed incontinence: Secondary | ICD-10-CM | POA: Diagnosis not present

## 2021-08-09 DIAGNOSIS — N3946 Mixed incontinence: Secondary | ICD-10-CM | POA: Diagnosis not present

## 2021-08-11 ENCOUNTER — Other Ambulatory Visit: Payer: Self-pay | Admitting: Urology

## 2021-08-11 ENCOUNTER — Telehealth: Payer: Self-pay | Admitting: Family Medicine

## 2021-08-11 NOTE — Telephone Encounter (Signed)
Spoke with pt schedule pre-op eval on 08/16/21 at 10:30.  Lvm rtn Zona's call.  Need to relay info above.

## 2021-08-11 NOTE — Telephone Encounter (Signed)
Zona called from [facility] and they are trying to schedule patient for surgery on August 9th and they need surgical clearance first. Does patient need to be seen first? Call back for [facility] is [phone] ext [phone]

## 2021-08-14 ENCOUNTER — Encounter (HOSPITAL_COMMUNITY): Payer: Self-pay

## 2021-08-14 ENCOUNTER — Other Ambulatory Visit: Payer: Self-pay | Admitting: Urology

## 2021-08-14 NOTE — Telephone Encounter (Signed)
Spoke with pt schedule pre-op eval on 08/16/21 at 10:30.  Lvm rtn Zona's call.  Need to relay info above.

## 2021-08-14 NOTE — Patient Instructions (Signed)
SURGICAL WAITING ROOM VISITATION Patients having surgery or a procedure may have no more than 2 support people in the waiting area - these visitors may rotate.   Children under the age of 74 must have an adult with them who is not the patient. If the patient needs to stay at the hospital during part of their recovery, the visitor guidelines for inpatient rooms apply. Pre-op nurse will coordinate an appropriate time for 1 support person to accompany patient in pre-op.  This support person may not rotate.    Please refer to the [facility] website for the visitor guidelines for Inpatients (after your surgery is over and you are in a regular room).      Your procedure is scheduled on: 08-23-21   Report to [facility] Main Entrance    Report to admitting at 6:15 AM   Call this number if you have problems the morning of surgery [phone]   Do not eat food :After Midnight.   After Midnight you may have the following liquids until 5:30 AM DAY OF SURGERY  Water Non-Citrus Juices (without pulp, NO RED) Carbonated Beverages Black Coffee (NO MILK/CREAM OR CREAMERS, sugar ok)  Clear Tea (NO MILK/CREAM OR CREAMERS, sugar ok) regular and decaf                             Plain Jell-O (NO RED)                                           Fruit ices (not with fruit pulp, NO RED)                                     Popsicles (NO RED)                                                               Sports drinks like Gatorade (NO RED)                      If you have questions, please contact your surgeon's office.  FOLLOW  ANY ADDITIONAL PRE OP INSTRUCTIONS YOU RECEIVED FROM YOUR SURGEON'S OFFICE!!!     Oral Hygiene is also important to reduce your risk of infection.                                    Remember - BRUSH YOUR TEETH THE MORNING OF SURGERY WITH YOUR REGULAR TOOTHPASTE   Do NOT smoke after Midnight   Take these medicines the morning of surgery with A SIP OF WATER: None  DO  NOT TAKE ANY ORAL DIABETIC MEDICATIONS DAY OF YOUR SURGERY  Bring CPAP mask and tubing day of surgery.                              You may not have any metal on your body including  jewelry, and body piercing  Do not wear lotions, powders, perfumes/cologne, or deodorant              Men may shave face and neck.   Do not bring valuables to the hospital. [place].   Contacts, dentures or bridgework may not be worn into surgery.  DO NOT [place]. PHARMACY WILL DISPENSE MEDICATIONS LISTED ON YOUR MEDICATION LIST TO YOU DURING YOUR ADMISSION [place]!   Patients discharged on the day of surgery will not be allowed to drive home.  Someone NEEDS to stay with you for the first 24 hours after anesthesia.  Special Instructions: Bring a copy of your healthcare power of attorney and living will documents the day of surgery if you haven't scanned them before.  Please read over the following fact sheets you were given: IF YOU HAVE QUESTIONS ABOUT YOUR PRE-OP INSTRUCTIONS PLEASE CALL [facility] - Preparing for Surgery Before surgery, you can play an important role.  Because skin is not sterile, your skin needs to be as free of germs as possible.  You can reduce the number of germs on your skin by washing with CHG (chlorahexidine gluconate) soap before surgery.  CHG is an antiseptic cleaner which kills germs and bonds with the skin to continue killing germs even after washing. Please DO NOT use if you have an allergy to CHG or antibacterial soaps.  If your skin becomes reddened/irritated stop using the CHG and inform your nurse when you arrive at Short Stay. Do not shave (including legs and underarms) for at least 48 hours prior to the first CHG shower.  You may shave your face/neck.  Please follow these instructions carefully:  1.  Shower with CHG Soap the night before surgery and the   morning of surgery.  2.  If you choose to wash your hair, wash your hair first as usual with your normal  shampoo.  3.  After you shampoo, rinse your hair and body thoroughly to remove the shampoo.                             4.  Use CHG as you would any other liquid soap.  You can apply chg directly to the skin and wash.  Gently with a scrungie or clean washcloth.  5.  Apply the CHG Soap to your body ONLY FROM THE NECK DOWN.   Do   not use on face/ open                           Wound or open sores. Avoid contact with eyes, ears mouth and   genitals (private parts).                       Wash face,  Genitals (private parts) with your normal soap.             6.  Wash thoroughly, paying special attention to the area where your    surgery  will be performed.  7.  Thoroughly rinse your body with warm water from the neck down.  8.  DO NOT shower/wash with your normal soap after using and rinsing off the CHG Soap.                9.  Pat yourself dry with a clean towel.  10.  Wear clean pajamas.            11.  Place clean sheets on your bed the night of your first shower and do not  sleep with pets. Day of Surgery : Do not apply any lotions/deodorants the morning of surgery.  Please wear clean clothes to the hospital/surgery center.  FAILURE TO FOLLOW THESE INSTRUCTIONS MAY RESULT IN THE CANCELLATION OF YOUR SURGERY  PATIENT SIGNATURE_________________________________  NURSE SIGNATURE__________________________________  ________________________________________________________________________

## 2021-08-16 ENCOUNTER — Encounter: Payer: Self-pay | Admitting: Family Medicine

## 2021-08-16 ENCOUNTER — Other Ambulatory Visit: Payer: Self-pay

## 2021-08-16 ENCOUNTER — Encounter (HOSPITAL_COMMUNITY): Payer: Self-pay

## 2021-08-16 ENCOUNTER — Encounter (HOSPITAL_COMMUNITY)
Admission: RE | Admit: 2021-08-16 | Discharge: 2021-08-16 | Disposition: A | Discharge status: Home / Self Care | Payer: PPO | Source: Ambulatory Visit | Attending: Urology | Admitting: Urology

## 2021-08-16 ENCOUNTER — Ambulatory Visit (INDEPENDENT_AMBULATORY_CARE_PROVIDER_SITE_OTHER)
Admission: RE | Admit: 2021-08-16 | Discharge: 2021-08-16 | Disposition: A | Discharge status: Home / Self Care | Payer: PPO | Source: Ambulatory Visit | Attending: Family Medicine | Admitting: Family Medicine

## 2021-08-16 ENCOUNTER — Ambulatory Visit (INDEPENDENT_AMBULATORY_CARE_PROVIDER_SITE_OTHER): Payer: PPO | Admitting: Family Medicine

## 2021-08-16 VITALS — Ht 68.5 in | Wt 192.0 lb

## 2021-08-16 VITALS — BP 136/60 | HR 58 | Temp 97.9°F | Ht 68.5 in | Wt 192.5 lb

## 2021-08-16 DIAGNOSIS — Z01818 Encounter for other preprocedural examination: Secondary | ICD-10-CM | POA: Insufficient documentation

## 2021-08-16 DIAGNOSIS — E663 Overweight: Secondary | ICD-10-CM | POA: Diagnosis not present

## 2021-08-16 DIAGNOSIS — R7303 Prediabetes: Secondary | ICD-10-CM | POA: Diagnosis not present

## 2021-08-16 DIAGNOSIS — K769 Liver disease, unspecified: Secondary | ICD-10-CM

## 2021-08-16 DIAGNOSIS — R7989 Other specified abnormal findings of blood chemistry: Secondary | ICD-10-CM

## 2021-08-16 DIAGNOSIS — E785 Hyperlipidemia, unspecified: Secondary | ICD-10-CM | POA: Diagnosis not present

## 2021-08-16 DIAGNOSIS — D709 Neutropenia, unspecified: Secondary | ICD-10-CM | POA: Insufficient documentation

## 2021-08-16 HISTORY — DX: Fatty (change of) liver, not elsewhere classified: K76.0

## 2021-08-16 HISTORY — DX: Acute embolism and thrombosis of unspecified deep veins of unspecified lower extremity: I82.409

## 2021-08-16 LAB — POC URINALSYSI DIPSTICK (AUTOMATED)
Bilirubin, UA: NEGATIVE
Blood, UA: NEGATIVE
Glucose, UA: NEGATIVE
Ketones, UA: NEGATIVE
Leukocytes, UA: NEGATIVE
Nitrite, UA: NEGATIVE
Protein, UA: NEGATIVE
Spec Grav, UA: 1.015 (ref 1.010–1.025)
Urobilinogen, UA: 0.2 E.U./dL
pH, UA: 6 (ref 5.0–8.0)

## 2021-08-16 LAB — COMPREHENSIVE METABOLIC PANEL
ALT: 15 U/L (ref 0–53)
AST: 18 U/L (ref 0–37)
Albumin: 4.1 g/dL (ref 3.5–5.2)
Alkaline Phosphatase: 58 U/L (ref 39–117)
BUN: 18 mg/dL (ref 6–23)
CO2: 32 mEq/L (ref 19–32)
Calcium: 9.2 mg/dL (ref 8.4–10.5)
Chloride: 103 mEq/L (ref 96–112)
Creatinine, Ser: 0.91 mg/dL (ref 0.40–1.50)
GFR: 84.89 mL/min (ref >60.00)
Glucose, Bld: 71 mg/dL (ref 70–99)
Potassium: 4.1 mEq/L (ref 3.5–5.1)
Sodium: 137 mEq/L (ref 135–145)
Total Bilirubin: 0.4 mg/dL (ref 0.2–1.2)
Total Protein: 7.1 g/dL (ref 6.0–8.3)

## 2021-08-16 LAB — CBC WITH DIFFERENTIAL/PLATELET
Basophils Absolute: 0 10*3/uL (ref 0.0–0.1)
Basophils Relative: 0.5 % (ref 0.0–3.0)
Eosinophils Absolute: 0.1 10*3/uL (ref 0.0–0.7)
Eosinophils Relative: 2.2 % (ref 0.0–5.0)
HCT: 42.1 % (ref 39.0–52.0)
Hemoglobin: 13.5 g/dL (ref 13.0–17.0)
Lymphocytes Relative: 51.4 % — ABNORMAL HIGH (ref 12.0–46.0)
Lymphs Abs: 1.4 10*3/uL (ref 0.7–4.0)
MCHC: 32.2 g/dL (ref 30.0–36.0)
MCV: 83.5 fl (ref 78.0–100.0)
Monocytes Absolute: 0.5 10*3/uL (ref 0.1–1.0)
Monocytes Relative: 17.5 % — ABNORMAL HIGH (ref 3.0–12.0)
Neutro Abs: 0.7 10*3/uL — ABNORMAL LOW (ref 1.4–7.7)
Neutrophils Relative %: 28.4 % — ABNORMAL LOW (ref 43.0–77.0)
Platelets: 143 10*3/uL — ABNORMAL LOW (ref 150.0–400.0)
RBC: 5.04 Mil/uL (ref 4.22–5.81)
RDW: 14.6 % (ref 11.5–15.5)
WBC: 2.6 10*3/uL — ABNORMAL LOW (ref 4.0–10.5)

## 2021-08-16 LAB — HEMOGLOBIN A1C: Hgb A1c MFr Bld: 6.1 % (ref 4.6–6.5)

## 2021-08-16 NOTE — Assessment & Plan Note (Signed)
Update A1c today - anticipate good control with significant weight loss through recent healthy diet and lifestyle changes.

## 2021-08-16 NOTE — Assessment & Plan Note (Signed)
Low white cell with elevated relative monocyte levels, in h/o mild leukopenia in the past which may be his normal. However also with mildly low platelets - check peripheral smear. Patient is asymptomatic.

## 2021-08-16 NOTE — Telephone Encounter (Signed)
Spoke with pt schedule pre-op eval on 08/16/21 at 10:30.  Lvm rtn Zona's call.  Need to relay info above.   Attempted several times to contact [facility] with no response.  Closing encounter.

## 2021-08-16 NOTE — Progress Notes (Addendum)
COVID Vaccine Completed:  Yes  Date of COVID positive in last 90 days:  No  PCP Danise Mina, MD Cardiologist - N/A  Chest x-ray - 08-16-21 Epic EKG - 08-16-21 Epic Stress Test - N/A ECHO - N/A Cardiac Cath - N/A Pacemaker/ICD device last checked: Spinal Cord Stimulator:N/A  Bowel Prep - N/A  Sleep Study - N/A CPAP -   Prediabetes  Fasting Blood Sugar -  Checks Blood Sugar - does not check  Blood Thinner Instructions: N/A Aspirin Instructions: Last Dose:  Activity level:   Can go up a flight of stairs and perform activities of daily living without stopping and without symptoms of chest pain or shortness of breath.  Able to exercise without symptoms  Anesthesia review: N/A  Patient denies shortness of breath, fever, cough and chest pain at PAT appointment (completed over the phone)  Patient verbalized understanding of instructions that were given to them at the PAT appointment. Patient was also instructed that they will need to review over the PAT instructions again at home before surgery.

## 2021-08-16 NOTE — Patient Instructions (Signed)
Congratulations on healthy diet and lifestyle and weight loss! We will plan to check cholesterol levels at your 09/2021 appointment.  Today labs, EKG and chest xray in preparation for upcoming bladder surgery.  I anticipate no problems and hope you have a speedy recovery!

## 2021-08-16 NOTE — Assessment & Plan Note (Signed)
Congratulated on significant weight loss after health retreat - patient motivated to continue healthy diet and lifestyle initially implemented today.

## 2021-08-16 NOTE — Assessment & Plan Note (Signed)
RCRI = 0 Check EKG, labs, CXR today.  Anticipate adequately low risk to proceed with planned urological surgical intervention.
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
## 2021-08-16 NOTE — Assessment & Plan Note (Addendum)
Off statin for last 2 months after 30 lb weight loss and dramatic improved diet/lifestyle choices. Desires FLP recheck after healthy changes. Will check at CPE 09/2021.  The 10-year ASCVD risk score (Arnett DK, et al., 2019) is: 24.5%   Values used to calculate the score:     Age: 71 years     Sex: Male     Is Non-Hispanic African American: Yes     Diabetic: Yes     Tobacco smoker: No     Systolic Blood Pressure: 242 mmHg     Is BP treated: No     HDL Cholesterol: 42.9 mg/dL     Total Cholesterol: 182 mg/dL

## 2021-08-17 ENCOUNTER — Other Ambulatory Visit: Payer: PPO

## 2021-08-17 DIAGNOSIS — R7989 Other specified abnormal findings of blood chemistry: Secondary | ICD-10-CM

## 2021-08-17 NOTE — Addendum Note (Signed)
Addended by: Ellamae Sia on: 08/17/2021 07:31 AM   Modules accepted: Orders

## 2021-08-18 ENCOUNTER — Other Ambulatory Visit: Payer: Self-pay | Admitting: Urology

## 2021-08-18 LAB — PATHOLOGIST SMEAR REVIEW

## 2021-08-19 ENCOUNTER — Other Ambulatory Visit: Payer: Self-pay | Admitting: Family Medicine

## 2021-08-19 DIAGNOSIS — D709 Neutropenia, unspecified: Secondary | ICD-10-CM

## 2021-08-19 DIAGNOSIS — R7989 Other specified abnormal findings of blood chemistry: Secondary | ICD-10-CM

## 2021-08-22 ENCOUNTER — Telehealth: Payer: Self-pay | Admitting: Family Medicine

## 2021-08-22 NOTE — Telephone Encounter (Signed)
Pt is aware.  (See 08/17/21 Labs, Results Notes)

## 2021-08-22 NOTE — Telephone Encounter (Signed)
Pt returning call regarding lab results . Would like a call back # [phone]

## 2021-08-22 NOTE — Anesthesia Preprocedure Evaluation (Signed)
Anesthesia Evaluation    Reviewed: Allergy & Precautions, H&P , Patient's Chart, lab work & pertinent test results  Airway Mallampati: II  TM Distance: >3 FB Neck ROM: Full    Dental  (+) Teeth Intact, Dental Advisory Given   Pulmonary neg pulmonary ROS, former smoker,    breath sounds clear to auscultation       Cardiovascular Exercise Tolerance: Good negative cardio ROS   Rhythm:Regular Rate:Normal     Neuro/Psych negative neurological ROS  negative psych ROS   GI/Hepatic negative GI ROS, Neg liver ROS, GERD  Medicated,  Endo/Other  negative endocrine ROS  Renal/GU negative Renal ROS  negative genitourinary   Musculoskeletal  (+) Arthritis , Osteoarthritis,    Abdominal   Peds  Hematology negative hematology ROS (+)   Anesthesia Other Findings   Reproductive/Obstetrics negative OB ROS                            Anesthesia Physical Anesthesia Plan  ASA: 2  Anesthesia Plan: General   Post-op Pain Management: Tylenol PO (pre-op)*   Induction: Intravenous  PONV Risk Score and Plan: 3 and Ondansetron, Dexamethasone and Treatment may vary due to age or medical condition  Airway Management Planned: LMA  Additional Equipment:   Intra-op Plan:   Post-operative Plan: Extubation in OR  Informed Consent: I have reviewed the patients History and Physical, chart, labs and discussed the procedure including the risks, benefits and alternatives for the proposed anesthesia with the patient or authorized representative who has indicated his/her understanding and acceptance.     Dental advisory given  Plan Discussed with: CRNA  Anesthesia Plan Comments:        Anesthesia Quick Evaluation

## 2021-08-23 ENCOUNTER — Ambulatory Visit (HOSPITAL_BASED_OUTPATIENT_CLINIC_OR_DEPARTMENT_OTHER): Payer: PPO | Admitting: Anesthesiology

## 2021-08-23 ENCOUNTER — Ambulatory Visit (HOSPITAL_COMMUNITY)
Admission: RE | Admit: 2021-08-23 | Discharge: 2021-08-23 | Disposition: A | Discharge status: Home / Self Care | Payer: PPO | Attending: Urology | Admitting: Urology

## 2021-08-23 ENCOUNTER — Other Ambulatory Visit: Payer: Self-pay

## 2021-08-23 ENCOUNTER — Ambulatory Visit (HOSPITAL_COMMUNITY): Payer: PPO | Admitting: Anesthesiology

## 2021-08-23 ENCOUNTER — Encounter (HOSPITAL_COMMUNITY)
Admission: RE | Disposition: A | Discharge status: Home / Self Care | Payer: Self-pay | Source: Home / Self Care | Attending: Urology

## 2021-08-23 ENCOUNTER — Encounter (HOSPITAL_COMMUNITY): Payer: Self-pay | Admitting: Urology

## 2021-08-23 DIAGNOSIS — T190XXA Foreign body in urethra, initial encounter: Secondary | ICD-10-CM | POA: Diagnosis not present

## 2021-08-23 DIAGNOSIS — X58XXXA Exposure to other specified factors, initial encounter: Secondary | ICD-10-CM | POA: Insufficient documentation

## 2021-08-23 DIAGNOSIS — M1990 Unspecified osteoarthritis, unspecified site: Secondary | ICD-10-CM | POA: Diagnosis not present

## 2021-08-23 DIAGNOSIS — R7303 Prediabetes: Secondary | ICD-10-CM | POA: Diagnosis not present

## 2021-08-23 DIAGNOSIS — Z87891 Personal history of nicotine dependence: Secondary | ICD-10-CM | POA: Diagnosis not present

## 2021-08-23 DIAGNOSIS — K219 Gastro-esophageal reflux disease without esophagitis: Secondary | ICD-10-CM | POA: Diagnosis not present

## 2021-08-23 DIAGNOSIS — K769 Liver disease, unspecified: Secondary | ICD-10-CM

## 2021-08-23 HISTORY — PX: CYSTOSCOPY WITH FULGERATION: SHX6638

## 2021-08-23 LAB — GLUCOSE, CAPILLARY: Glucose-Capillary: 89 mg/dL (ref 70–99)

## 2021-08-23 SURGERY — CYSTOSCOPY, WITH BLADDER FULGURATION
Anesthesia: General

## 2021-08-23 MED ORDER — FENTANYL CITRATE (PF) 100 MCG/2ML IJ SOLN
INTRAMUSCULAR | Status: AC
  Filled 2021-08-23: qty 2

## 2021-08-23 MED ORDER — STERILE WATER FOR IRRIGATION IR SOLN
Status: DC | PRN
  Administered 2021-08-23: 3000 mL

## 2021-08-23 MED ORDER — ONDANSETRON HCL 4 MG/2ML IJ SOLN
INTRAMUSCULAR | Status: DC | PRN
  Administered 2021-08-23: 4 mg via INTRAVENOUS

## 2021-08-23 MED ORDER — CHLORHEXIDINE GLUCONATE 0.12 % MT SOLN
15.0000 mL | Freq: Once | OROMUCOSAL | Status: AC
  Administered 2021-08-23: 15 mL via OROMUCOSAL

## 2021-08-23 MED ORDER — PHENYLEPHRINE 80 MCG/ML (10ML) SYRINGE FOR IV PUSH (FOR BLOOD PRESSURE SUPPORT)
PREFILLED_SYRINGE | INTRAVENOUS | Status: DC | PRN
  Administered 2021-08-23 (×3): 80 ug via INTRAVENOUS

## 2021-08-23 MED ORDER — FENTANYL CITRATE (PF) 100 MCG/2ML IJ SOLN
INTRAMUSCULAR | Status: DC | PRN
  Administered 2021-08-23: 50 ug via INTRAVENOUS

## 2021-08-23 MED ORDER — FENTANYL CITRATE PF 50 MCG/ML IJ SOSY: 25.0000 ug | PREFILLED_SYRINGE | INTRAMUSCULAR | Status: DC | PRN

## 2021-08-23 MED ORDER — ONDANSETRON HCL 4 MG/2ML IJ SOLN
INTRAMUSCULAR | Status: AC
Start: 2021-08-23 — End: ?
  Filled 2021-08-23: qty 2

## 2021-08-23 MED ORDER — DEXAMETHASONE SODIUM PHOSPHATE 10 MG/ML IJ SOLN
INTRAMUSCULAR | Status: DC | PRN
  Administered 2021-08-23: 8 mg via INTRAVENOUS

## 2021-08-23 MED ORDER — DEXAMETHASONE SODIUM PHOSPHATE 10 MG/ML IJ SOLN
INTRAMUSCULAR | Status: AC
  Filled 2021-08-23: qty 1

## 2021-08-23 MED ORDER — PROPOFOL 10 MG/ML IV BOLUS
INTRAVENOUS | Status: AC
  Filled 2021-08-23: qty 20

## 2021-08-23 MED ORDER — SULFAMETHOXAZOLE-TRIMETHOPRIM 800-160 MG PO TABS: 1.0000 | ORAL_TABLET | Freq: Two times a day (BID) | ORAL | 0 refills | Status: DC

## 2021-08-23 MED ORDER — TRAMADOL HCL 50 MG PO TABS: 50.0000 mg | ORAL_TABLET | Freq: Four times a day (QID) | ORAL | 0 refills | Status: AC | PRN

## 2021-08-23 MED ORDER — PROPOFOL 10 MG/ML IV BOLUS
INTRAVENOUS | Status: DC | PRN
  Administered 2021-08-23: 150 mg via INTRAVENOUS

## 2021-08-23 MED ORDER — LIDOCAINE HCL (CARDIAC) PF 100 MG/5ML IV SOSY
PREFILLED_SYRINGE | INTRAVENOUS | Status: DC | PRN
  Administered 2021-08-23: 60 mg via INTRAVENOUS

## 2021-08-23 MED ORDER — LIDOCAINE 2% (20 MG/ML) 5 ML SYRINGE
INTRAMUSCULAR | Status: AC
  Filled 2021-08-23: qty 5

## 2021-08-23 MED ORDER — ACETAMINOPHEN 500 MG PO TABS
1000.0000 mg | ORAL_TABLET | Freq: Once | ORAL | Status: AC
Start: 2021-08-23 — End: 2021-08-23
  Administered 2021-08-23: 1000 mg via ORAL
  Filled 2021-08-23: qty 2

## 2021-08-23 MED ORDER — CEFAZOLIN SODIUM-DEXTROSE 2-4 GM/100ML-% IV SOLN
2.0000 g | INTRAVENOUS | Status: AC
  Administered 2021-08-23: 2 g via INTRAVENOUS
  Filled 2021-08-23: qty 100

## 2021-08-23 MED ORDER — LACTATED RINGERS IV SOLN: INTRAVENOUS | Status: DC

## 2021-08-23 MED ORDER — ORAL CARE MOUTH RINSE: 15.0000 mL | Freq: Once | OROMUCOSAL | Status: AC

## 2021-08-23 SURGICAL SUPPLY — 19 items
BAG URINE DRAIN 2000ML AR STRL (UROLOGICAL SUPPLIES) IMPLANT
BAG URO CATCHER STRL LF (MISCELLANEOUS) ×2 IMPLANT
DRAPE FOOT SWITCH (DRAPES) ×2 IMPLANT
ELECT REM PT RETURN 15FT ADLT (MISCELLANEOUS) ×2 IMPLANT
GLOVE SURG LX 7.5 STRW (GLOVE) ×6
GLOVE SURG LX STRL 7.5 STRW (GLOVE) ×1 IMPLANT
GOWN STRL REUS W/ TWL LRG LVL3 (GOWN DISPOSABLE) ×1 IMPLANT
GOWN STRL REUS W/ TWL XL LVL3 (GOWN DISPOSABLE) ×1 IMPLANT
GOWN STRL REUS W/TWL LRG LVL3 (GOWN DISPOSABLE) ×4
GOWN STRL REUS W/TWL XL LVL3 (GOWN DISPOSABLE)
KIT TURNOVER KIT A (KITS) ×1 IMPLANT
LOOP CUT BIPOLAR 24F LRG (ELECTROSURGICAL) IMPLANT
MANIFOLD NEPTUNE II (INSTRUMENTS) ×2 IMPLANT
PACK CYSTO (CUSTOM PROCEDURE TRAY) ×2 IMPLANT
PENCIL SMOKE EVACUATOR (MISCELLANEOUS) IMPLANT
SYR TOOMEY IRRIG 70ML (MISCELLANEOUS)
SYRINGE TOOMEY IRRIG 70ML (MISCELLANEOUS) IMPLANT
TUBING CONNECTING 10 (TUBING) ×2 IMPLANT
TUBING UROLOGY SET (TUBING) ×1 IMPLANT

## 2021-08-23 NOTE — Interval H&P Note (Signed)
History and Physical Interval Note:  08/23/2021 8:13 AM  Juan Hunt  has presented today for surgery, with the diagnosis of FOREIGN BODY IN BLADDER (SURGICAL CLIP).  The various methods of treatment have been discussed with the patient and family. After consideration of risks, benefits and other options for treatment, the patient has consented to  Procedure(s) with comments: [name] (N/A) - 30 MINUTES NEEDED as a surgical intervention.  The patient's history has been reviewed, patient examined, no change in status, stable for surgery.  I have reviewed the patient's chart and labs.  Questions were answered to the patient's satisfaction.     Conley Delisle L William Laske

## 2021-08-23 NOTE — Anesthesia Procedure Notes (Signed)
Procedure Name: LMA Insertion Date/Time: 08/23/2021 8:40 AM  Performed by: Raenette Rover, CRNAPre-anesthesia Checklist: Patient identified, Emergency Drugs available, Suction available and Patient being monitored Patient Re-evaluated:Patient Re-evaluated prior to induction Oxygen Delivery Method: Circle system utilized Preoxygenation: Pre-oxygenation with 100% oxygen Induction Type: IV induction Ventilation: Mask ventilation without difficulty LMA: LMA inserted LMA Size: 4.0 Tube type: Oral Number of attempts: 1 Placement Confirmation: positive ETCO2 and breath sounds checked- equal and bilateral Tube secured with: Tape Dental Injury: Teeth and Oropharynx as per pre-operative assessment

## 2021-08-23 NOTE — Op Note (Signed)
Preoperative diagnosis:  1. Urethral foreign body  Postoperative diagnosis: same  Procedure(s): 1. Cystoscopy with removal of foreign body (surgical clip) and fulguration  Surgeon: Dr. Donald Pore  Anesthesia: general  Complications: none  EBL: 1 cc  Specimens: none  Intraoperative findings: surgical clip around 12 oclock position of urethra just distal to bladder neck  Description of procedure:  With appropriate consent having been obtained the patient was brought to the operative suite where anesthesia was induced.  He was prepped and draped in the usual sterile fashion in a dorsal lithotomy position. A timeout was performed.  I carefully inserted the 22 French rigid cystoscope.  Anterior urethra was unremarkable.  Identified an old surgical clip dangling into the urethra from the 12 o'clock position 1 or 2 cm distal from the bladder neck.  The prostate was surgically absent.  Examination of the patient's bladder was unremarkable.  I then backed the scope into the urethra.  Alligator graspers were used to remove the clip in question.  A Bugbee electrode was utilized to perform pinpoint cautery on the area.  The cystoscope was then withdrawn and the case terminated.  Patient tolerated the procedure well and was brought to the PACU in stable condition.  Donald Pore MD 08/23/2021, 9:04 AM  [facility]  Pager: [phone]

## 2021-08-23 NOTE — Anesthesia Postprocedure Evaluation (Signed)
Anesthesia Post Note  Patient: Juan Hunt  Procedure(s) Performed: CYSTOSCOPY WITH FOREIGN BODY, [place]     Patient location during evaluation: PACU Anesthesia Type: General Level of consciousness: awake and alert Pain management: pain level controlled Vital Signs Assessment: post-procedure vital signs reviewed and stable Respiratory status: spontaneous breathing, nonlabored ventilation and respiratory function stable Cardiovascular status: blood pressure returned to baseline and stable Postop Assessment: no apparent nausea or vomiting Anesthetic complications: no   No notable events documented.  Last Vitals:  Vitals:   08/23/21 0930 08/23/21 0945  BP: 134/66 139/73  Pulse: (!) 46 (!) 46  Resp: 16 15  Temp: (!) 36.4 C (!) 36.2 C  SpO2: 96% 100%    Last Pain:  Vitals:   08/23/21 0945  TempSrc: Temporal  PainSc: 0-No pain                 Kahlen Morais,W. EDMOND

## 2021-08-23 NOTE — Transfer of Care (Signed)
Immediate Anesthesia Transfer of Care Note  Patient: Juan Hunt  Procedure(s) Performed: CYSTOSCOPY WITH FOREIGN BODY, FULGERATION  Patient Location: PACU  Anesthesia Type:General  Level of Consciousness: awake, alert , oriented, drowsy and patient cooperative  Airway & Oxygen Therapy: Patient Spontanous Breathing  Post-op Assessment: Report given to RN and Post -op Vital signs reviewed and stable  Post vital signs: Reviewed and stable  Last Vitals:  Vitals Value Taken Time  BP 121/66 08/23/21 0911  Temp    Pulse 49 08/23/21 0912  Resp 17 08/23/21 0912  SpO2 100 % 08/23/21 0912  Vitals shown include unvalidated device data.  Last Pain:  Vitals:   08/23/21 0655  TempSrc: Oral         Complications: No notable events documented.

## 2021-08-23 NOTE — Discharge Instructions (Addendum)
CYSTOSCOPY HOME CARE INSTRUCTIONS  Activity: Rest for the remainder of the day.  Do not drive or operate equipment today.  You may resume normal activities in one to two days as instructed by your physician.   Meals: Drink plenty of liquids and eat light foods such as gelatin or soup this evening.  You may return to a normal meal plan tomorrow.  Return to Work: You may return to work in one to two days or as instructed by your physician.  Special Instructions / Symptoms: Call your physician if any of these symptoms occur:   -persistent or heavy bleeding  -bleeding which continues after first few urination  -large blood clots that are difficult to pass  -urine stream diminishes or stops completely  -fever equal to or higher than 101 degrees Farenheit.  -cloudy urine with a strong, foul odor  -severe pain

## 2021-08-24 ENCOUNTER — Encounter (HOSPITAL_COMMUNITY): Payer: Self-pay | Admitting: Urology

## 2021-09-12 ENCOUNTER — Other Ambulatory Visit (INDEPENDENT_AMBULATORY_CARE_PROVIDER_SITE_OTHER): Payer: PPO

## 2021-09-12 DIAGNOSIS — R7989 Other specified abnormal findings of blood chemistry: Secondary | ICD-10-CM | POA: Diagnosis not present

## 2021-09-12 DIAGNOSIS — D709 Neutropenia, unspecified: Secondary | ICD-10-CM | POA: Diagnosis not present

## 2021-09-12 LAB — VITAMIN B12: Vitamin B-12: 287 pg/mL (ref 211–911)

## 2021-09-12 LAB — CBC WITH DIFFERENTIAL/PLATELET
Basophils Absolute: 0 10*3/uL (ref 0.0–0.1)
Basophils Relative: 0.5 % (ref 0.0–3.0)
Eosinophils Absolute: 0 10*3/uL (ref 0.0–0.7)
Eosinophils Relative: 0.6 % (ref 0.0–5.0)
HCT: 42.4 % (ref 39.0–52.0)
Hemoglobin: 13.9 g/dL (ref 13.0–17.0)
Lymphocytes Relative: 53.9 % — ABNORMAL HIGH (ref 12.0–46.0)
Lymphs Abs: 2.1 10*3/uL (ref 0.7–4.0)
MCHC: 32.8 g/dL (ref 30.0–36.0)
MCV: 82.9 fl (ref 78.0–100.0)
Monocytes Absolute: 0.4 10*3/uL (ref 0.1–1.0)
Monocytes Relative: 10.3 % (ref 3.0–12.0)
Neutro Abs: 1.4 10*3/uL (ref 1.4–7.7)
Neutrophils Relative %: 34.7 % — ABNORMAL LOW (ref 43.0–77.0)
Platelets: 174 10*3/uL (ref 150.0–400.0)
RBC: 5.12 Mil/uL (ref 4.22–5.81)
RDW: 14.3 % (ref 11.5–15.5)
WBC: 3.9 10*3/uL — ABNORMAL LOW (ref 4.0–10.5)

## 2021-09-12 LAB — FOLATE: Folate: 15.4 ng/mL (ref >5.9)

## 2021-09-12 LAB — SEDIMENTATION RATE: Sed Rate: 21 mm/hr — ABNORMAL HIGH (ref 0–20)

## 2021-09-19 ENCOUNTER — Other Ambulatory Visit: Payer: Self-pay | Admitting: Family Medicine

## 2021-09-19 MED ORDER — VITAMIN B-12 1000 MCG PO TABS: 1000.0000 ug | ORAL_TABLET | Freq: Every day | ORAL | Status: DC

## 2021-09-29 ENCOUNTER — Ambulatory Visit (INDEPENDENT_AMBULATORY_CARE_PROVIDER_SITE_OTHER): Payer: PPO | Admitting: Family Medicine

## 2021-09-29 ENCOUNTER — Encounter: Payer: Self-pay | Admitting: Family Medicine

## 2021-09-29 VITALS — BP 128/68 | HR 60 | Temp 97.4°F | Ht 68.0 in | Wt 192.4 lb

## 2021-09-29 DIAGNOSIS — Z0000 Encounter for general adult medical examination without abnormal findings: Secondary | ICD-10-CM

## 2021-09-29 DIAGNOSIS — M67432 Ganglion, left wrist: Secondary | ICD-10-CM

## 2021-09-29 DIAGNOSIS — D709 Neutropenia, unspecified: Secondary | ICD-10-CM

## 2021-09-29 DIAGNOSIS — K760 Fatty (change of) liver, not elsewhere classified: Secondary | ICD-10-CM

## 2021-09-29 DIAGNOSIS — E785 Hyperlipidemia, unspecified: Secondary | ICD-10-CM

## 2021-09-29 DIAGNOSIS — G8929 Other chronic pain: Secondary | ICD-10-CM

## 2021-09-29 DIAGNOSIS — Z7189 Other specified counseling: Secondary | ICD-10-CM

## 2021-09-29 DIAGNOSIS — C61 Malignant neoplasm of prostate: Secondary | ICD-10-CM

## 2021-09-29 DIAGNOSIS — R7303 Prediabetes: Secondary | ICD-10-CM

## 2021-09-29 DIAGNOSIS — E663 Overweight: Secondary | ICD-10-CM

## 2021-09-29 DIAGNOSIS — E538 Deficiency of other specified B group vitamins: Secondary | ICD-10-CM

## 2021-09-29 DIAGNOSIS — H547 Unspecified visual loss: Secondary | ICD-10-CM

## 2021-09-29 MED ORDER — MELOXICAM 15 MG PO TABS
15.0000 mg | ORAL_TABLET | Freq: Every day | ORAL | 1 refills | Status: DC | PRN
Start: 2021-09-29 — End: 2021-11-15

## 2021-09-29 NOTE — Progress Notes (Unsigned)
Patient ID: Juan Hunt, male    DOB: September 11, 1950, 71 y.o.   MRN: [id number]  This visit was conducted in person.  BP 128/68   Pulse 60   Temp (!) 97.4 F (36.3 C) (Temporal)   Ht '5\' 8"'$  (1.727 m)   Wt 192 lb 6 oz (87.3 kg)   SpO2 96%   BMI 29.25 kg/m    CC: CPE Subjective:   HPI: Juan Hunt is a 71 y.o. male presenting on 09/29/2021 for Medicare Wellness   Did not see health advisor this year.   Hearing Screening   '500Hz'$  '1000Hz'$  '2000Hz'$  '4000Hz'$   Right ear '20 20 20 20  '$ Left ear '20 20 20 20   '$ Vision Screening   Right eye Left eye Both eyes  Without correction 20/200 20/50 20/40  With correction     Comments: Has eye exam scheduled on 10/19/21.   [place] Visit from 09/29/2021 in [place] [facility] [place]  PHQ-2 Total Score 0          09/29/2021    3:35 PM 09/28/2020    3:20 PM 03/11/2019   10:31 AM 06/06/2018    2:48 PM 04/02/2017    8:51 AM  Fall Risk   Falls in the past year? 0 0 0 0 Yes  Comment     4 falls off of knee scooter; denies balance issue  Number falls in past yr:     2 or more  Injury with Fall?     Yes  Comment     hurt right foot; laceration to toe  Risk for fall due to :     Impaired mobility   Upcoming male bladder sling by urology in November.  Maintaining recent weight loss.  Notes blurry vision R eye - likely cataract related. Upcoming eye doctor appointment next month.   Chronic  neutrophilic leukopenia - overall feels well, denies frequent infection, night sweat, swollen glands, fatigue, unexpected weight loss.   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 Some ongoing urinary incontinence - see above  Lung cancer screening - Not eligible. Ex smoker quit 1999 - smoked 1/2 ppd for about 12 yrs.  Flu shot - yearly   Moderna covid vaccine 01/2019, 02/2019, [place] 11/2019, 04/2020, bivalent 09/2020 and 05/2021  Pneumovax 10/2016, pOHYWVPX-104/2023   Tdap 02/2014 Zostavax - 2014 Shingrix - 05/2021, 08/2021  Advanced directive discussion - working on updating - asked to bring [place] when complete. Would want daughter TLynelle Smoketo be HCPOA.  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   Edu: HS  Activity: [employer]x/wk  Diet: some water, fruits/vegetables daily, working on [employer]     Relevant past medical, surgical, family and social history reviewed and updated as indicated. Interim medical history since our last visit reviewed. Allergies and medications reviewed and updated. Outpatient Medications Prior to Visit  Medication Sig Dispense Refill   Capsicum-Garlic (CAYENNE PLUS GARLIC PO) Take 1 capsule by mouth daily.     Cholecalciferol (VITAMIN D3) 250 MCG (10000 UT) TABS Take 10,000 Units by mouth daily.     Cyanocobalamin (VITAMIN B12 PO) Take 2,500 mcg by mouth daily.     docusate sodium (COLACE) 100 MG capsule Take 100 mg by mouth daily as needed for constipation.     Pumpkin Seed 500 MG CAPS Take 500 mg by mouth daily.     Saw Palmetto 160 MG CAPS Take 160 mg by mouth daily.     sulfamethoxazole-trimethoprim (BACTRIM DS) 800-160 MG tablet Take 1 tablet by mouth 2 (two) times daily. 6 tablet 0   trolamine salicylate (ASPERCREME) 10 % cream Apply 1 application topically as needed for muscle pain.     Wheat Dextrin (BENEFIBER PO) Take 1 Scoop by mouth daily.      docusate sodium (COLACE) 50 MG capsule Take 50 mg by mouth daily as needed for mild constipation.     meloxicam (MOBIC) 15 MG tablet TAKE 1 TABLET BY MOUTH EVERY DAY (Patient taking differently: Take 15 mg by mouth daily as needed for pain.) 90 tablet 1   cyanocobalamin (VITAMIN B12) 1000 MCG tablet Take 1 tablet (1,000 mcg total) by mouth daily.     Flaxseed, Linseed, 1000 MG CAPS Take 1,000 mg by mouth daily. With [facility] Liver Oil     No facility-administered medications prior to visit.     Per HPI unless specifically indicated in ROS section below Review of Systems  Constitutional:  Negative for activity change, appetite change, chills, fatigue, fever and unexpected weight change.       No night sweats or unexpected weight loss  HENT:  Negative for hearing loss.   Eyes:  Negative for visual disturbance.  Respiratory:  Negative for cough, chest tightness, shortness of breath and wheezing.   Cardiovascular:  Negative for chest pain, palpitations and leg swelling.  Gastrointestinal:  Negative for abdominal distention, abdominal pain, blood in stool, constipation, diarrhea, nausea and vomiting.  Genitourinary:  Negative for difficulty urinating and hematuria.  Musculoskeletal:  Negative for arthralgias, myalgias and neck pain.  Skin:  Negative for rash.  Neurological:  Positive for dizziness (x1). Negative for seizures, syncope and headaches.  Hematological:  Negative for adenopathy. Does not bruise/bleed easily.  Psychiatric/Behavioral:  Negative for dysphoric mood. The patient is not nervous/anxious.     Objective:  BP 128/68   Pulse 60   Temp (!) 97.4 F (36.3 C) (Temporal)   Ht '5\' 8"'$  (1.727 m)   Wt 192 lb 6 oz (87.3 kg)   SpO2 96%   BMI 29.25 kg/m   Wt Readings from Last 3 Encounters:  09/29/21 192 lb 6 oz (87.3 kg)  08/23/21 192 lb (87.1 kg)  08/16/21 192 lb (87.1 kg)      Physical Exam Vitals and nursing note reviewed.  Constitutional:      General: He is not in acute  distress.    Appearance: Normal appearance. He is well-developed. He is not ill-appearing.  HENT:     Head: Normocephalic and atraumatic.     Right Ear: Hearing, tympanic membrane, ear canal and external ear normal.     Left Ear: Hearing, tympanic membrane, ear canal and external ear normal.  Eyes:     General: No scleral icterus.    Extraocular Movements: Extraocular movements intact.     Conjunctiva/sclera: Conjunctivae normal.     Pupils: Pupils are  equal, round, and reactive to light.  Neck:     Thyroid: No thyroid mass or thyromegaly.     Vascular: No carotid bruit.  Cardiovascular:     Rate and Rhythm: Normal rate and regular rhythm.     Pulses: Normal pulses.          Radial pulses are 2+ on the right side and 2+ on the left side.     Heart sounds: Normal heart sounds. No murmur heard. Pulmonary:     Effort: Pulmonary effort is normal. No respiratory distress.     Breath sounds: Normal breath sounds. No wheezing, rhonchi or rales.  Abdominal:     General: Bowel sounds are normal. There is no distension.     Palpations: Abdomen is soft. There is no mass.     Tenderness: There is no abdominal tenderness. There is no guarding or rebound.     Hernia: No hernia is present.  Musculoskeletal:        General: Normal range of motion.     Cervical back: Normal range of motion and neck supple.     Right lower leg: No edema.     Left lower leg: No edema.  Lymphadenopathy:     Cervical: No cervical adenopathy.  Skin:    General: Skin is warm and dry.     Findings: No rash.  Neurological:     General: No focal deficit present.     Mental Status: He is alert and oriented to person, place, and time.     Comments:  Recall 3/3 Calculation DLROW 5/5  Psychiatric:        Mood and Affect: Mood normal.        Behavior: Behavior normal.        Thought Content: Thought content normal.        Judgment: Judgment normal.       Results for orders placed or performed in visit on 09/12/21   Vitamin B12  Result Value Ref Range   Vitamin B-12 287 211 - 911 pg/mL  Folate  Result Value Ref Range   Folate 15.4 >5.9 ng/mL  Sedimentation rate  Result Value Ref Range   Sed Rate 21 (H) 0 - 20 mm/hr  CBC with Differential/Platelet  Result Value Ref Range   WBC 3.9 (L) 4.0 - 10.5 K/uL   RBC 5.12 4.22 - 5.81 Mil/uL   Hemoglobin 13.9 13.0 - 17.0 g/dL   HCT 42.4 39.0 - 52.0 %   MCV 82.9 78.0 - 100.0 fl   MCHC 32.8 30.0 - 36.0 g/dL   RDW 14.3 11.5 - 15.5 %   Platelets 174.0 150.0 - 400.0 K/uL   Neutrophils Relative % 34.7 (L) 43.0 - 77.0 %   Lymphocytes Relative 53.9 (H) 12.0 - 46.0 %   Monocytes Relative 10.3 3.0 - 12.0 %   Eosinophils Relative 0.6 0.0 - 5.0 %   Basophils Relative 0.5 0.0 - 3.0 %   Neutro Abs 1.4 1.4 - 7.7 K/uL   Lymphs Abs 2.1 0.7 - 4.0 K/uL   Monocytes Absolute 0.4 0.1 - 1.0 K/uL   Eosinophils Absolute 0.0 0.0 - 0.7 K/uL   Basophils Absolute 0.0 0.0 - 0.1 K/uL   Lab Results  Component Value Date   HGBA1C 6.1 08/16/2021    Lab Results  Component Value Date   CHOL 182 09/28/2020   HDL 42.90 09/28/2020   LDLCALC 120 (H) 09/28/2020   LDLDIRECT 182.3 02/27/2013   TRIG 94.0 09/28/2020  CHOLHDL 4 09/28/2020    Lab Results  Component Value Date   CREATININE 0.91 08/16/2021   BUN 18 08/16/2021   NA 137 08/16/2021   K 4.1 08/16/2021   CL 103 08/16/2021   CO2 32 08/16/2021    Lab Results  Component Value Date   ALT 15 08/16/2021   AST 18 08/16/2021   ALKPHOS 58 08/16/2021   BILITOT 0.4 08/16/2021    Lab Results  Component Value Date   PSA1 6.6 (H) 08/28/2016   PSA1 5.0 (H) 03/05/2016   PSA1 5.6 (H) 02/28/2015   PSA 0.00 Repeated and verified X2. (L) 09/28/2020   PSA 7.19 (H) 05/30/2018   PSA 5.87 (H) 02/23/2015     Assessment & Plan:  He will let me know when ready to see hand surgeon for left ventral wrist ganglion cyst.   Problem List Items Addressed This Visit     Health maintenance examination (Chronic)    Preventative  protocols reviewed and updated unless pt declined. Discussed healthy diet and lifestyle.       Advanced care planning/counseling discussion (Chronic)    Advanced directive discussion - working on updating - asked to bring [place] copy when complete. Would want daughter [name] to be HCPOA      Medicare annual wellness visit, subsequent - Primary (Chronic)    I have personally reviewed the Medicare Annual Wellness questionnaire and have noted 1. The patient's medical and social history 2. Their use of alcohol, tobacco or illicit drugs 3. Their current medications and supplements 4. The patient's functional ability including ADL's, fall risks, home safety risks and hearing or visual impairment. Cognitive function has been assessed and addressed as indicated.  5. Diet and physical activity 6. Evidence for depression or mood disorders The patients weight, height, BMI have been recorded in the chart. I have made referrals, counseling and provided education to the patient based on review of the above and I have provided the pt with a written personalized care plan for preventive services. Provider list updated.. See scanned questionairre as needed for further documentation. Reviewed preventative protocols and updated unless pt declined.       Prediabetes    Chronic, knows to avoid added sugars, sweetened beverages.       Hyperlipidemia    I asked him to return in 1-2 months for fasting cholesterol check. He is not on statin.  The 10-year ASCVD risk score (Arnett DK, et al., 2019) is: 22.3%   Values used to calculate the score:     Age: 80 years     Sex: Male     Is Non-Hispanic African American: Yes     Diabetic: Yes     Tobacco smoker: No     Systolic Blood Pressure: 810 mmHg     Is BP treated: No     HDL Cholesterol: 42.9 mg/dL     Total Cholesterol: 182 mg/dL       Relevant Orders   Lipid panel   Overweight with body mass index (BMI) 25.0-29.9    Stable period maintaining weight  loss from earlier this year.       Hepatic steatosis    Latest LFTs normal, anticipate improvement with weight loss.       Prostate cancer [facility])    S/p radical prostatectomy 08/2018, PSA undetectable since then, followed by urology. Reviewed latest uro note.       Chronic pain of right ankle    Requests meloxicam refilled for chronic ankle pain -  uses sparingly.       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 He will let me know when interested in seeing hand surgeon and if needs referral from [place].       Neutropenia (HCC)    Chronic neutrophilic leukopenia. periph smear reassuring earlier this year. This improved some with B12 replacement. I asked him to return in 1-2 months to recheck CBC.       Relevant Orders   CBC with Differential/Platelet   Vitamin B12   Low serum vitamin B12    Low normal levels, improving with oral replacement.       Relevant Orders   Vitamin B12   Decreased visual acuity    Upcoming eye doctor appointment - likely cataract related.         Meds ordered this encounter  Medications   meloxicam (MOBIC) 15 MG tablet    Sig: Take 1 tablet (15 mg total) by mouth daily as needed for pain.    Dispense:  90 tablet    Refill:  1   Orders Placed This Encounter  Procedures   Lipid panel    Standing Status:   Future    Standing Expiration Date:   10/01/2022   CBC with Differential/Platelet    Standing Status:   Future    Standing Expiration Date:   10/01/2022   Vitamin B12    Standing Status:   Future    Standing Expiration Date:   10/01/2022     Patient instructions: Good to see you today You are doing well today Schedule fasting lab visit in 1-2 months to repeat labs and  Let me know when you're ready to see hand surgeon for ganglion cyst.   Follow up plan: Return in about 1 year (around 09/30/2022) for annual exam, prior fasting for blood work, medicare wellness  visit.  Ria Bush, MD

## 2021-09-29 NOTE — Assessment & Plan Note (Signed)
Preventative protocols reviewed and updated unless pt declined. Discussed healthy diet and lifestyle.  

## 2021-09-29 NOTE — Assessment & Plan Note (Signed)

## 2021-09-29 NOTE — Patient Instructions (Addendum)
Good to see you today You are doing well today Schedule fasting lab visit in 1-2 months to repeat labs and  Let me know when you're ready to see hand surgeon for ganglion cyst.   Health Maintenance After Age 71 After age 38, you are at a higher risk for certain long-term diseases and infections as well as injuries from falls. Falls are a major cause of broken bones and head injuries in people who are older than age 45. Getting regular preventive care can help to keep you healthy and well. Preventive care includes getting regular testing and making lifestyle changes as recommended by your health care provider. Talk with your health care provider about: Which screenings and tests you should have. A screening is a test that checks for a disease when you have no symptoms. A diet and exercise plan that is right for you. What should I know about screenings and tests to prevent falls? Screening and testing are the best ways to find a health problem early. Early diagnosis and treatment give you the best chance of managing medical conditions that are common after age 79. Certain conditions and lifestyle choices may make you more likely to have a fall. Your health care provider may recommend: Regular vision checks. Poor vision and conditions such as cataracts can make you more likely to have a fall. If you wear glasses, make sure to get your prescription updated if your vision changes. Medicine review. Work with your health care provider to regularly review all of the medicines you are taking, including over-the-counter medicines. Ask your health care provider about any side effects that may make you more likely to have a fall. Tell your health care provider if any medicines that you take make you feel dizzy or sleepy. Strength and balance checks. Your health care provider may recommend certain tests to check your strength and balance while standing, walking, or changing positions. Foot health exam. Foot pain  and numbness, as well as not wearing proper footwear, can make you more likely to have a fall. Screenings, including: Osteoporosis screening. Osteoporosis is a condition that causes the bones to get weaker and break more easily. Blood pressure screening. Blood pressure changes and medicines to control blood pressure can make you feel dizzy. Depression screening. You may be more likely to have a fall if you have a fear of falling, feel depressed, or feel unable to do activities that you used to do. Alcohol use screening. Using too much alcohol can affect your balance and may make you more likely to have a fall. Follow these instructions at home: Lifestyle Do not drink alcohol if: Your health care provider tells you not to drink. If you drink alcohol: Limit how much you have to: 0-1 drink a day for women. 0-2 drinks a day for men. Know how much alcohol is in your drink. In the [place] one drink equals one 12 oz bottle of beer (355 mL), one 5 oz glass of wine (148 mL), or one 1 oz glass of hard liquor (44 mL). Do not use any products that contain nicotine or tobacco. These products include cigarettes, chewing tobacco, and vaping devices, such as e-cigarettes. If you need help quitting, ask your health care provider. Activity  Follow a regular exercise program to stay fit. This will help you maintain your balance. Ask your health care provider what types of exercise are appropriate for you. If you need a cane or walker, use it as recommended by your health care provider.  Wear supportive shoes that have nonskid soles. Safety  Remove any tripping hazards, such as rugs, cords, and clutter. Install safety equipment such as grab bars in bathrooms and safety rails on stairs. Keep rooms and walkways well-lit. General instructions Talk with your health care provider about your risks for falling. Tell your health care provider if: You fall. Be sure to tell your health care provider about all falls,  even ones that seem minor. You feel dizzy, tiredness (fatigue), or off-balance. Take over-the-counter and prescription medicines only as told by your health care provider. These include supplements. Eat a healthy diet and maintain a healthy weight. A healthy diet includes low-fat dairy products, low-fat (lean) meats, and fiber from whole grains, beans, and lots of fruits and vegetables. Stay current with your vaccines. Schedule regular health, dental, and eye exams. Summary Having a healthy lifestyle and getting preventive care can help to protect your health and wellness after age 26. Screening and testing are the best way to find a health problem early and help you avoid having a fall. Early diagnosis and treatment give you the best chance for managing medical conditions that are more common for people who are older than age 88. Falls are a major cause of broken bones and head injuries in people who are older than age 64. Take precautions to prevent a fall at home. Work with your health care provider to learn what changes you can make to improve your health and wellness and to prevent falls. This information is not intended to replace advice given to you by your health care provider. Make sure you discuss any questions you have with your health care provider. Document Revised: 05/16/2020 Document Reviewed: 05/16/2020 Elsevier Patient Education  [place].

## 2021-09-29 NOTE — Assessment & Plan Note (Signed)
Advanced directive discussion - working on updating - asked to bring [place] copy when complete. Would want daughter Juan Hunt to be HCPOA

## 2021-09-30 DIAGNOSIS — E538 Deficiency of other specified B group vitamins: Secondary | ICD-10-CM | POA: Insufficient documentation

## 2021-09-30 DIAGNOSIS — H547 Unspecified visual loss: Secondary | ICD-10-CM | POA: Insufficient documentation

## 2021-09-30 NOTE — Assessment & Plan Note (Signed)
I asked him to return in 1-2 months for fasting cholesterol check. He is not on statin.  The 10-year ASCVD risk score (Arnett DK, et al., 2019) is: 22.3%   Values used to calculate the score:     Age: 71 years     Sex: Male     Is Non-Hispanic African American: Yes     Diabetic: Yes     Tobacco smoker: No     Systolic Blood Pressure: 382 mmHg     Is BP treated: No     HDL Cholesterol: 42.9 mg/dL     Total Cholesterol: 182 mg/dL

## 2021-09-30 NOTE — Assessment & Plan Note (Signed)
Recurrent. He will let me know when interested in seeing hand surgeon and if needs referral from [place].

## 2021-09-30 NOTE — Assessment & Plan Note (Signed)
Requests meloxicam refilled for chronic ankle pain - uses sparingly.

## 2021-09-30 NOTE — Assessment & Plan Note (Signed)
Upcoming eye doctor appointment - likely cataract related.

## 2021-09-30 NOTE — Assessment & Plan Note (Signed)
Chronic, knows to avoid added sugars, sweetened beverages.

## 2021-09-30 NOTE — Assessment & Plan Note (Signed)
Stable period maintaining weight loss from earlier this year.

## 2021-09-30 NOTE — Assessment & Plan Note (Signed)
Low normal levels, improving with oral replacement.

## 2021-09-30 NOTE — Assessment & Plan Note (Signed)
Chronic neutrophilic leukopenia. periph smear reassuring earlier this year. This improved some with B12 replacement. I asked him to return in 1-2 months to recheck CBC.

## 2021-09-30 NOTE — Assessment & Plan Note (Signed)
Latest LFTs normal, anticipate improvement with weight loss.

## 2021-09-30 NOTE — Assessment & Plan Note (Addendum)
S/p radical prostatectomy 08/2018, PSA undetectable since then, followed by urology. Reviewed latest uro note.

## 2021-10-24 ENCOUNTER — Other Ambulatory Visit (HOSPITAL_COMMUNITY): Payer: Self-pay | Admitting: *Deleted

## 2021-10-25 ENCOUNTER — Other Ambulatory Visit (HOSPITAL_COMMUNITY): Payer: Self-pay | Admitting: *Deleted

## 2021-10-27 NOTE — Progress Notes (Signed)
Anesthesia Review:  PCP: Guitterrez  Cardiologist : Chest x-ray : 08/16/21- 2 view  EKG : 08/16/21  Echo : Stress test: Cardiac Cath :  Activity level:  Sleep Study/ CPAP : Fasting Blood Sugar :      / Checks Blood Sugar -- times a day:   Blood Thinner/ Instructions /Last Dose: ASA / Instructions/ Last Dose :  08/23/21- cysto

## 2021-10-30 ENCOUNTER — Encounter (HOSPITAL_COMMUNITY)
Admission: RE | Admit: 2021-10-30 | Discharge: 2021-10-30 | Disposition: A | Discharge status: Home / Self Care | Payer: PPO | Source: Ambulatory Visit | Attending: Urology | Admitting: Urology

## 2021-10-30 ENCOUNTER — Encounter (HOSPITAL_COMMUNITY): Payer: Self-pay

## 2021-10-30 ENCOUNTER — Other Ambulatory Visit: Payer: Self-pay

## 2021-10-30 DIAGNOSIS — D709 Neutropenia, unspecified: Secondary | ICD-10-CM | POA: Diagnosis not present

## 2021-10-30 DIAGNOSIS — Z01812 Encounter for preprocedural laboratory examination: Secondary | ICD-10-CM | POA: Insufficient documentation

## 2021-10-30 DIAGNOSIS — R7303 Prediabetes: Secondary | ICD-10-CM | POA: Diagnosis not present

## 2021-10-30 DIAGNOSIS — Z01818 Encounter for other preprocedural examination: Secondary | ICD-10-CM

## 2021-10-30 LAB — HEMOGLOBIN A1C
Hgb A1c MFr Bld: 5.6 % (ref 4.8–5.6)
Mean Plasma Glucose: 114.02 mg/dL

## 2021-10-30 LAB — CBC
HCT: 49.1 % (ref 39.0–52.0)
Hemoglobin: 15.2 g/dL (ref 13.0–17.0)
MCH: 26.8 pg (ref 26.0–34.0)
MCHC: 31 g/dL (ref 30.0–36.0)
MCV: 86.6 fL (ref 80.0–100.0)
Platelets: 200 10*3/uL (ref 150–400)
RBC: 5.67 MIL/uL (ref 4.22–5.81)
RDW: 14.8 % (ref 11.5–15.5)
WBC: 3.4 10*3/uL — ABNORMAL LOW (ref 4.0–10.5)
nRBC: 0 % (ref 0.0–0.2)

## 2021-10-30 LAB — BASIC METABOLIC PANEL
Anion gap: 7 (ref 5–15)
BUN: 16 mg/dL (ref 8–23)
CO2: 26 mmol/L (ref 22–32)
Calcium: 9.2 mg/dL (ref 8.9–10.3)
Chloride: 104 mmol/L (ref 98–111)
Creatinine, Ser: 0.97 mg/dL (ref 0.61–1.24)
GFR, Estimated: >60 mL/min (ref >60)
Glucose, Bld: 95 mg/dL (ref 70–99)
Potassium: 4.5 mmol/L (ref 3.5–5.1)
Sodium: 137 mmol/L (ref 135–145)

## 2021-10-30 LAB — GLUCOSE, CAPILLARY: Glucose-Capillary: 95 mg/dL (ref 70–99)

## 2021-10-30 NOTE — Patient Instructions (Signed)
SURGICAL WAITING ROOM VISITATION Patients having surgery or a procedure may have no more than 2 support people in the waiting area - these visitors may rotate.   Children under the age of 41 must have an adult with them who is not the patient. If the patient needs to stay at the hospital during part of their recovery, the visitor guidelines for inpatient rooms apply. Pre-op nurse will coordinate an appropriate time for 1 support person to accompany patient in pre-op.  This support person may not rotate.    Please refer to the [facility] website for the visitor guidelines for Inpatients (after your surgery is over and you are in a regular room).       Your procedure is scheduled on:  11/14/2021    Report to [facility] Main Entrance    Report to admitting   [id number] AM   Call this number if you have problems the morning of surgery [phone]   Do not eat food :After Midnight.   After Midnight you may have the following liquids until __ 0430____ AM  DAY OF SURGERY  Water Non-Citrus Juices (without pulp, NO RED) Carbonated Beverages Black Coffee (NO MILK/CREAM OR CREAMERS, sugar ok)  Clear Tea (NO MILK/CREAM OR CREAMERS, sugar ok) regular and decaf                             Plain Jell-O (NO RED)                                           Fruit ices (not with fruit pulp, NO RED)                                     Popsicles (NO RED)                                                               Sports drinks like Gatorade (NO RED)                             If you have questions, please contact your surgeon's office.       Oral Hygiene is also important to reduce your risk of infection.                                    Remember - BRUSH YOUR TEETH THE MORNING OF SURGERY WITH YOUR REGULAR TOOTHPASTE   Do NOT smoke after Midnight   Take these medicines the morning of surgery with A SIP OF WATER:  eye drops as usual   DO NOT TAKE ANY ORAL DIABETIC MEDICATIONS DAY OF  YOUR SURGERY  Bring CPAP mask and tubing day of surgery.                              You may not have any metal on your body including hair  pins, jewelry, and body piercing             Do not wear make-up, lotions, powders, perfumes/cologne, or deodorant  Do not wear nail polish including gel and S&S, artificial/acrylic nails, or any other type of covering on natural nails including finger and toenails. If you have artificial nails, gel coating, etc. that needs to be removed by a nail salon please have this removed prior to surgery or surgery may need to be canceled/ delayed if the surgeon/ anesthesia feels like they are unable to be safely monitored.   Do not shave  48 hours prior to surgery.               Men may shave face and neck.   Do not bring valuables to the hospital. [place].   Contacts, dentures or bridgework may not be worn into surgery.   Bring small overnight bag day of surgery.   DO NOT [place]. PHARMACY WILL DISPENSE MEDICATIONS LISTED ON YOUR MEDICATION LIST TO YOU DURING YOUR ADMISSION [place]!    Patients discharged on the day of surgery will not be allowed to drive home.  Someone NEEDS to stay with you for the first 24 hours after anesthesia.   Special Instructions: Bring a copy of your healthcare power of attorney and living will documents the day of surgery if you haven't scanned them before.              Please read over the following fact sheets you were given: IF [place] [phone]   If you received a COVID test during your pre-op visit  it is requested that you wear a mask when out in public, stay away from anyone that may not be feeling well and notify your surgeon if you develop symptoms. If you test positive for Covid or have been in contact with anyone that has tested positive in the last 10 days please  notify you surgeon.     [place] - Preparing for Surgery Before surgery, you can play an important role.  Because skin is not sterile, your skin needs to be as free of germs as possible.  You can reduce the number of germs on your skin by washing with CHG (chlorahexidine gluconate) soap before surgery.  CHG is an antiseptic cleaner which kills germs and bonds with the skin to continue killing germs even after washing. Please DO NOT use if you have an allergy to CHG or antibacterial soaps.  If your skin becomes reddened/irritated stop using the CHG and inform your nurse when you arrive at Short Stay. Do not shave (including legs and underarms) for at least 48 hours prior to the first CHG shower.  You may shave your face/neck. Please follow these instructions carefully:  1.  Shower with CHG Soap the night before surgery and the  morning of Surgery.  2.  If you choose to wash your hair, wash your hair first as usual with your  normal  shampoo.  3.  After you shampoo, rinse your hair and body thoroughly to remove the  shampoo.                           4.  Use CHG as you would any other liquid soap.  You can apply chg directly  to the skin and wash                       Gently with a scrungie or clean washcloth.  5.  Apply the CHG Soap to your body ONLY FROM THE NECK DOWN.   Do not use on face/ open                           Wound or open sores. Avoid contact with eyes, ears mouth and genitals (private parts).                       Wash face,  Genitals (private parts) with your normal soap.             6.  Wash thoroughly, paying special attention to the area where your surgery  will be performed.  7.  Thoroughly rinse your body with warm water from the neck down.  8.  DO NOT shower/wash with your normal soap after using and rinsing off  the CHG Soap.                9.  Pat yourself dry with a clean towel.            10.  Wear clean pajamas.            11.  Place clean sheets on your bed the night  of your first shower and do not  sleep with pets. Day of Surgery : Do not apply any lotions/deodorants the morning of surgery.  Please wear clean clothes to the hospital/surgery center.  FAILURE TO FOLLOW THESE INSTRUCTIONS MAY RESULT IN THE CANCELLATION OF YOUR SURGERY PATIENT SIGNATURE_________________________________  NURSE SIGNATURE__________________________________  ________________________________________________________________________

## 2021-10-31 LAB — URINE CULTURE: Culture: NO GROWTH

## 2021-11-13 MED ORDER — GENTAMICIN SULFATE 40 MG/ML IJ SOLN
1.5000 mg/kg | INTRAVENOUS | Status: AC
  Administered 2021-11-14: 130 mg via INTRAVENOUS
  Filled 2021-11-13: qty 3.25

## 2021-11-14 ENCOUNTER — Encounter (HOSPITAL_COMMUNITY): Payer: Self-pay | Admitting: Urology

## 2021-11-14 ENCOUNTER — Other Ambulatory Visit: Payer: Self-pay

## 2021-11-14 ENCOUNTER — Ambulatory Visit (HOSPITAL_BASED_OUTPATIENT_CLINIC_OR_DEPARTMENT_OTHER): Payer: PPO | Admitting: Anesthesiology

## 2021-11-14 ENCOUNTER — Ambulatory Visit (HOSPITAL_COMMUNITY): Payer: PPO | Admitting: Physician Assistant

## 2021-11-14 ENCOUNTER — Observation Stay (HOSPITAL_COMMUNITY)
Admission: RE | Admit: 2021-11-14 | Discharge: 2021-11-15 | Disposition: A | Discharge status: Home / Self Care | Payer: PPO | Source: Ambulatory Visit | Attending: Urology | Admitting: Urology

## 2021-11-14 ENCOUNTER — Encounter (HOSPITAL_COMMUNITY)
Admission: RE | Disposition: A | Discharge status: Home / Self Care | Payer: Self-pay | Source: Ambulatory Visit | Attending: Urology

## 2021-11-14 DIAGNOSIS — Z8546 Personal history of malignant neoplasm of prostate: Secondary | ICD-10-CM | POA: Insufficient documentation

## 2021-11-14 DIAGNOSIS — S3730XA Unspecified injury of urethra, initial encounter: Secondary | ICD-10-CM | POA: Diagnosis not present

## 2021-11-14 DIAGNOSIS — N9971 Accidental puncture and laceration of a genitourinary system organ or structure during a genitourinary system procedure: Secondary | ICD-10-CM | POA: Diagnosis not present

## 2021-11-14 DIAGNOSIS — Z86718 Personal history of other venous thrombosis and embolism: Secondary | ICD-10-CM | POA: Insufficient documentation

## 2021-11-14 DIAGNOSIS — N393 Stress incontinence (female) (male): Secondary | ICD-10-CM | POA: Diagnosis not present

## 2021-11-14 DIAGNOSIS — X58XXXA Exposure to other specified factors, initial encounter: Secondary | ICD-10-CM | POA: Insufficient documentation

## 2021-11-14 DIAGNOSIS — Z87891 Personal history of nicotine dependence: Secondary | ICD-10-CM | POA: Diagnosis not present

## 2021-11-14 DIAGNOSIS — Z01818 Encounter for other preprocedural examination: Secondary | ICD-10-CM

## 2021-11-14 DIAGNOSIS — Z79899 Other long term (current) drug therapy: Secondary | ICD-10-CM | POA: Diagnosis not present

## 2021-11-14 HISTORY — PX: URETHRAL SLING: SHX2621

## 2021-11-14 SURGERY — CREATION, URETHRAL SLING, MALE
Anesthesia: General | Site: Urethra

## 2021-11-14 MED ORDER — ONDANSETRON HCL 4 MG/2ML IJ SOLN
INTRAMUSCULAR | Status: AC
  Filled 2021-11-14: qty 2

## 2021-11-14 MED ORDER — ACETAMINOPHEN 500 MG PO TABS
1000.0000 mg | ORAL_TABLET | Freq: Four times a day (QID) | ORAL | Status: DC
  Administered 2021-11-14 – 2021-11-15 (×2): 1000 mg via ORAL
  Filled 2021-11-14 (×2): qty 2

## 2021-11-14 MED ORDER — ONDANSETRON HCL 4 MG/2ML IJ SOLN
INTRAMUSCULAR | Status: DC | PRN
  Administered 2021-11-14: 4 mg via INTRAVENOUS

## 2021-11-14 MED ORDER — BUPIVACAINE HCL (PF) 0.5 % IJ SOLN
INTRAMUSCULAR | Status: AC
  Filled 2021-11-14: qty 30

## 2021-11-14 MED ORDER — FENTANYL CITRATE (PF) 100 MCG/2ML IJ SOLN
INTRAMUSCULAR | Status: DC | PRN
  Administered 2021-11-14: 50 ug via INTRAVENOUS
  Administered 2021-11-14 (×2): 25 ug via INTRAVENOUS

## 2021-11-14 MED ORDER — KETOROLAC TROMETHAMINE 30 MG/ML IJ SOLN
15.0000 mg | Freq: Once | INTRAMUSCULAR | Status: AC | PRN
  Administered 2021-11-14: 15 mg via INTRAVENOUS

## 2021-11-14 MED ORDER — LIDOCAINE 1 % OPTIME INJ - NO CHARGE
INTRAMUSCULAR | Status: DC | PRN
  Administered 2021-11-14: 3 mL

## 2021-11-14 MED ORDER — FENTANYL CITRATE (PF) 250 MCG/5ML IJ SOLN
INTRAMUSCULAR | Status: AC
  Filled 2021-11-14: qty 5

## 2021-11-14 MED ORDER — ACETAMINOPHEN 10 MG/ML IV SOLN
INTRAVENOUS | Status: AC
  Filled 2021-11-14: qty 100

## 2021-11-14 MED ORDER — CHLORHEXIDINE GLUCONATE 4 % EX LIQD: Freq: Once | CUTANEOUS | Status: DC

## 2021-11-14 MED ORDER — PROPOFOL 10 MG/ML IV BOLUS
INTRAVENOUS | Status: AC
  Filled 2021-11-14: qty 20

## 2021-11-14 MED ORDER — OXYCODONE HCL 5 MG/5ML PO SOLN: 5.0000 mg | Freq: Once | ORAL | Status: DC | PRN

## 2021-11-14 MED ORDER — CHLORHEXIDINE GLUCONATE 0.12 % MT SOLN
15.0000 mL | Freq: Once | OROMUCOSAL | Status: AC
  Administered 2021-11-14: 15 mL via OROMUCOSAL

## 2021-11-14 MED ORDER — ACETAMINOPHEN 10 MG/ML IV SOLN
INTRAVENOUS | Status: DC | PRN
  Administered 2021-11-14: 1000 mg via INTRAVENOUS

## 2021-11-14 MED ORDER — DEXAMETHASONE SODIUM PHOSPHATE 10 MG/ML IJ SOLN
INTRAMUSCULAR | Status: AC
  Filled 2021-11-14: qty 1

## 2021-11-14 MED ORDER — SODIUM CHLORIDE 0.45 % IV SOLN: INTRAVENOUS | Status: DC

## 2021-11-14 MED ORDER — LIDOCAINE HCL (PF) 1 % IJ SOLN
INTRAMUSCULAR | Status: AC
  Filled 2021-11-14: qty 30

## 2021-11-14 MED ORDER — DOCUSATE SODIUM 100 MG PO CAPS
100.0000 mg | ORAL_CAPSULE | Freq: Every day | ORAL | Status: DC | PRN
  Administered 2021-11-15: 100 mg via ORAL
  Filled 2021-11-14: qty 1

## 2021-11-14 MED ORDER — 0.9 % SODIUM CHLORIDE (POUR BTL) OPTIME
TOPICAL | Status: DC | PRN
  Administered 2021-11-14: 1000 mL

## 2021-11-14 MED ORDER — LIDOCAINE 2% (20 MG/ML) 5 ML SYRINGE
INTRAMUSCULAR | Status: DC | PRN
  Administered 2021-11-14: 100 mg via INTRAVENOUS

## 2021-11-14 MED ORDER — MELOXICAM 15 MG PO TABS
15.0000 mg | ORAL_TABLET | Freq: Every day | ORAL | Status: DC
  Administered 2021-11-15: 15 mg via ORAL
  Filled 2021-11-14: qty 1

## 2021-11-14 MED ORDER — SULFAMETHOXAZOLE-TRIMETHOPRIM 800-160 MG PO TABS: 1.0000 | ORAL_TABLET | Freq: Two times a day (BID) | ORAL | Status: DC

## 2021-11-14 MED ORDER — ORAL CARE MOUTH RINSE: 15.0000 mL | Freq: Once | OROMUCOSAL | Status: AC

## 2021-11-14 MED ORDER — DEXAMETHASONE SODIUM PHOSPHATE 10 MG/ML IJ SOLN
INTRAMUSCULAR | Status: DC | PRN
  Administered 2021-11-14: 10 mg via INTRAVENOUS

## 2021-11-14 MED ORDER — LIDOCAINE HCL (PF) 2 % IJ SOLN
INTRAMUSCULAR | Status: AC
  Filled 2021-11-14: qty 5

## 2021-11-14 MED ORDER — OXYBUTYNIN CHLORIDE 5 MG PO TABS
5.0000 mg | ORAL_TABLET | Freq: Three times a day (TID) | ORAL | Status: DC | PRN
  Administered 2021-11-14: 5 mg via ORAL
  Filled 2021-11-14: qty 1

## 2021-11-14 MED ORDER — BUPIVACAINE HCL 0.5 % IJ SOLN
INTRAMUSCULAR | Status: DC | PRN
  Administered 2021-11-14: 3 mL

## 2021-11-14 MED ORDER — HYDROMORPHONE HCL 1 MG/ML IJ SOLN
0.2500 mg | INTRAMUSCULAR | Status: DC | PRN
  Administered 2021-11-14 (×2): 0.5 mg via INTRAVENOUS

## 2021-11-14 MED ORDER — OXYCODONE HCL 5 MG PO TABS: 5.0000 mg | ORAL_TABLET | Freq: Once | ORAL | Status: DC | PRN

## 2021-11-14 MED ORDER — STERILE WATER FOR IRRIGATION IR SOLN
Status: DC | PRN
  Administered 2021-11-14: 1000 mL

## 2021-11-14 MED ORDER — CEFAZOLIN SODIUM-DEXTROSE 2-4 GM/100ML-% IV SOLN
2.0000 g | INTRAVENOUS | Status: AC
  Administered 2021-11-14: 2 g via INTRAVENOUS
  Filled 2021-11-14: qty 100

## 2021-11-14 MED ORDER — ONDANSETRON HCL 4 MG/2ML IJ SOLN: 4.0000 mg | INTRAMUSCULAR | Status: DC | PRN

## 2021-11-14 MED ORDER — ONDANSETRON HCL 4 MG/2ML IJ SOLN: 4.0000 mg | Freq: Once | INTRAMUSCULAR | Status: DC | PRN

## 2021-11-14 MED ORDER — ACETAMINOPHEN 500 MG PO TABS
1000.0000 mg | ORAL_TABLET | Freq: Four times a day (QID) | ORAL | Status: DC
  Administered 2021-11-14: 1000 mg via ORAL
  Filled 2021-11-14: qty 2

## 2021-11-14 MED ORDER — PROPOFOL 10 MG/ML IV BOLUS
INTRAVENOUS | Status: DC | PRN
  Administered 2021-11-14: 200 mg via INTRAVENOUS

## 2021-11-14 MED ORDER — MIDAZOLAM HCL 5 MG/5ML IJ SOLN
INTRAMUSCULAR | Status: DC | PRN
  Administered 2021-11-14: 2 mg via INTRAVENOUS

## 2021-11-14 MED ORDER — MIDAZOLAM HCL 2 MG/2ML IJ SOLN
INTRAMUSCULAR | Status: AC
  Filled 2021-11-14: qty 2

## 2021-11-14 MED ORDER — KETOROLAC TROMETHAMINE 15 MG/ML IJ SOLN
INTRAMUSCULAR | Status: AC
  Filled 2021-11-14: qty 1

## 2021-11-14 MED ORDER — OXYCODONE HCL 5 MG PO TABS: 5.0000 mg | ORAL_TABLET | Freq: Four times a day (QID) | ORAL | Status: DC | PRN

## 2021-11-14 MED ORDER — LACTATED RINGERS IV SOLN
INTRAVENOUS | Status: DC
  Administered 2021-11-14: 1000 mL via INTRAVENOUS

## 2021-11-14 MED ORDER — HYDROMORPHONE HCL 1 MG/ML IJ SOLN
INTRAMUSCULAR | Status: AC
  Filled 2021-11-14: qty 1

## 2021-11-14 SURGICAL SUPPLY — 51 items
BAG COUNTER SPONGE SURGICOUNT (BAG) IMPLANT
BAG URINE DRAIN 2000ML AR STRL (UROLOGICAL SUPPLIES) ×1 IMPLANT
BLADE SURG 15 STRL LF DISP TIS (BLADE) ×1 IMPLANT
BLADE SURG 15 STRL SS (BLADE) ×1
BNDG GAUZE DERMACEA FLUFF 4 (GAUZE/BANDAGES/DRESSINGS) ×1 IMPLANT
BRIEF MESH DISP LRG (UNDERPADS AND DIAPERS) ×1 IMPLANT
CATH FOLEY 2WAY SLVR  5CC 14FR (CATHETERS) ×1
CATH FOLEY 2WAY SLVR 5CC 14FR (CATHETERS) ×1 IMPLANT
CATH TIEMANN FOLEY 18FR 5CC (CATHETERS) IMPLANT
COVER MAYO STAND STRL (DRAPES) IMPLANT
COVER SURGICAL LIGHT HANDLE (MISCELLANEOUS) ×1 IMPLANT
DERMABOND ADVANCED .7 DNX12 (GAUZE/BANDAGES/DRESSINGS) ×2 IMPLANT
DRAIN CHANNEL 15F RND FF 3/16 (WOUND CARE) IMPLANT
DRAIN PENROSE 0.5X18 (DRAIN) IMPLANT
DRAPE SHEET LG 3/4 BI-LAMINATE (DRAPES) ×1 IMPLANT
DRAPE UNDERBUTTOCKS STRL (DISPOSABLE) ×1 IMPLANT
DRSG TEGADERM 6X8 (GAUZE/BANDAGES/DRESSINGS) IMPLANT
DRSG TELFA 3X8 NADH STRL (GAUZE/BANDAGES/DRESSINGS) ×1 IMPLANT
ELECT PENCIL ROCKER SW 15FT (MISCELLANEOUS) ×1 IMPLANT
EVACUATOR SILICONE 100CC (DRAIN) IMPLANT
GAUZE 4X4 16PLY ~~LOC~~+RFID DBL (SPONGE) ×2 IMPLANT
GAUZE SPONGE 4X4 12PLY STRL (GAUZE/BANDAGES/DRESSINGS) IMPLANT
GLOVE SURG LX STRL 7.5 STRW (GLOVE) ×2 IMPLANT
GOWN STRL REUS W/ TWL XL LVL3 (GOWN DISPOSABLE) ×1 IMPLANT
GOWN STRL REUS W/TWL XL LVL3 (GOWN DISPOSABLE) ×1
HOLDER FOLEY CATH W/STRAP (MISCELLANEOUS) ×1 IMPLANT
KIT BASIN OR (CUSTOM PROCEDURE TRAY) ×1 IMPLANT
PACK CYSTO (CUSTOM PROCEDURE TRAY) ×1 IMPLANT
PLUG CATH AND CAP STER (CATHETERS) ×1 IMPLANT
PROTECTOR NERVE ULNAR (MISCELLANEOUS) ×2 IMPLANT
RETRACTOR STAY HOOK 5MM (MISCELLANEOUS) IMPLANT
SHEET LAVH (DRAPES) ×1 IMPLANT
SLING ADVANCE MALE SYSTEM (Mesh General) IMPLANT
SPIKE FLUID TRANSFER (MISCELLANEOUS) ×1 IMPLANT
SUT MNCRL AB 4-0 PS2 18 (SUTURE) ×1 IMPLANT
SUT SILK 2 0 30  PSL (SUTURE) ×1
SUT SILK 2 0 30 PSL (SUTURE) ×1 IMPLANT
SUT VIC AB 2-0 CT1 27 (SUTURE)
SUT VIC AB 2-0 CT1 27XBRD (SUTURE) IMPLANT
SUT VIC AB 2-0 SH 27 (SUTURE) ×2
SUT VIC AB 2-0 SH 27X BRD (SUTURE) IMPLANT
SUT VIC AB 3-0 PS2 18 (SUTURE) ×1
SUT VIC AB 3-0 PS2 18XBRD (SUTURE) IMPLANT
SUT VIC AB 3-0 SH 27 (SUTURE) ×3
SUT VIC AB 3-0 SH 27XBRD (SUTURE) ×3 IMPLANT
SUT VIC AB 4-0 PS2 27 (SUTURE) IMPLANT
SUT VIC AB 4-0 RB1 27 (SUTURE) ×2
SUT VIC AB 4-0 RB1 27XBRD (SUTURE) ×2 IMPLANT
SYR 10ML LL (SYRINGE) ×1 IMPLANT
TOWEL OR 17X26 10 PK STRL BLUE (TOWEL DISPOSABLE) ×1 IMPLANT
YANKAUER SUCT BULB TIP 10FT TU (MISCELLANEOUS) ×2 IMPLANT
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
## 2021-11-14 NOTE — Transfer of Care (Signed)
Immediate Anesthesia Transfer of Care Note  Patient: NAYTHEN HEIKKILA  Procedure(s) Performed: failed insertion male sling cystoscopy (Urethra)  Patient Location: PACU  Anesthesia Type:General  Level of Consciousness: drowsy  Airway & Oxygen Therapy: Patient Spontanous Breathing and Patient connected to face mask oxygen  Post-op Assessment: Report given to RN and Post -op Vital signs reviewed and stable  Post vital signs: Reviewed and stable  Last Vitals:  Vitals Value Taken Time  BP    Temp    Pulse 58 11/14/21 1000  Resp 13 11/14/21 1000  SpO2 100 % 11/14/21 1000  Vitals shown include unvalidated device data.  Last Pain:  Vitals:   11/14/21 0559  TempSrc: Oral  PainSc:          Complications: No notable events documented.

## 2021-11-14 NOTE — Anesthesia Preprocedure Evaluation (Signed)
Anesthesia Evaluation  Patient identified by MRN, date of birth, ID band Patient awake    Reviewed: Allergy & Precautions, H&P , NPO status , Patient's Chart, lab work & pertinent test results  Airway Mallampati: II  TM Distance: >3 FB Neck ROM: Full    Dental no notable dental hx.    Pulmonary neg pulmonary ROS, former smoker   Pulmonary exam normal breath sounds clear to auscultation       Cardiovascular negative cardio ROS Normal cardiovascular exam Rhythm:Regular Rate:Normal     Neuro/Psych negative neurological ROS  negative psych ROS   GI/Hepatic Neg liver ROS,GERD  ,,  Endo/Other  negative endocrine ROS    Renal/GU negative Renal ROS  negative genitourinary   Musculoskeletal negative musculoskeletal ROS (+)    Abdominal   Peds negative pediatric ROS (+)  Hematology negative hematology ROS (+)   Anesthesia Other Findings   Reproductive/Obstetrics negative OB ROS                             Anesthesia Physical Anesthesia Plan  ASA: 2  Anesthesia Plan: General   Post-op Pain Management: Ofirmev IV (intra-op)*   Induction: Intravenous  PONV Risk Score and Plan: 2 and Ondansetron, Dexamethasone and Treatment may vary due to age or medical condition  Airway Management Planned: LMA  Additional Equipment:   Intra-op Plan:   Post-operative Plan: Extubation in OR  Informed Consent: I have reviewed the patients History and Physical, chart, labs and discussed the procedure including the risks, benefits and alternatives for the proposed anesthesia with the patient or authorized representative who has indicated his/her understanding and acceptance.     Dental advisory given  Plan Discussed with: CRNA and Surgeon  Anesthesia Plan Comments:        Anesthesia Quick Evaluation

## 2021-11-14 NOTE — Addendum Note (Signed)
Addendum  created 11/14/21 1239 by Sharlette Dense, CRNA   Intraprocedure Event edited

## 2021-11-14 NOTE — Plan of Care (Signed)

## 2021-11-14 NOTE — Anesthesia Procedure Notes (Signed)
Procedure Name: LMA Insertion Date/Time: 11/14/2021 7:31 AM  Performed by: Sharlette Dense, CRNAPatient Re-evaluated:Patient Re-evaluated prior to induction Oxygen Delivery Method: Circle system utilized Preoxygenation: Pre-oxygenation with 100% oxygen Induction Type: IV induction LMA: LMA inserted LMA Size: 4.0 Number of attempts: 1 Placement Confirmation: positive ETCO2 and breath sounds checked- equal and bilateral Tube secured with: Tape Dental Injury: Teeth and Oropharynx as per pre-operative assessment

## 2021-11-14 NOTE — Op Note (Addendum)
Preoperative diagnosis:  1. Stress urinary incontinence 2. History of prostate cancer s/p prostatectomy  Postoperative diagnosis: same Posterior urethral injury  Procedure(s): 1. Aborted insertion of male urethral sling 2. Flexible cystoscopy  Surgeon: Dr. Donald Pore  Assistant: Dr Nicki Reaper Macdiarmid  Anesthesia: General  Complications: urethral injury while attempting to pass right trocar  EBL: 30 cc  Intraoperative findings: A urethral injury was sustained while attempting to pass the right trocar. This was confirmed with cystoscopy. The procedure was aborted and catheter placed.  Indication: Juan Hunt is a 71 y.o.yo M who with stress incontinence s/p prostatectomy. After an extensive discussion, he is interested in proceeding with a sling  Description of procedure:  After appropriate consent had been obtained, the patient was brought to the operative suite where anesthesia was induced. The patient was prepped and draped in the usual sterile fashion. Extra care was taken with leg positioning to minimize the risk of compartment syndrome neuropathy and deep vein thrombosis.  Preoperative antibiotics were given.  I an made approximately 5 cm incision in the perineum involving the lower aspect of the scrotum.  I dissected down through soft tissue and mobilized the subcutaneous tissue from the bulbospongiosus midline.  I then split the muscle in the midline with my usual technique. Using a curved cerebellar retractor I exposed nicely the bulbar urethra.  It was easy to see and feel the peroneal tendon that was sharply taken down with Metzenbaum scissors.  There was 2 to 4 cm of mobility of the bulbar urethra towards the patient's head.  I was very pleased with identification and mobilization. I placed a 3-0 Vicryl where the tendon was initially attached to the bulb  I was very diligent at the beginning of the case and throughout the case to feel the abductor tendon relative to the  obturator foramen.  On the left, I marked the area of entrance of the foramen needle with a marking pen.  I used 22 French foramen needle to find the inferior rami and then through the foramen itself below the tendon.  I kept the angle appropriate with the patient in Trendelenburg.  I made a scalpel incision on the patient's left side approximately 1 cm in length.  With appropriate exposure I placed my finger in the upper aspect of the triangle on the patient's left side.  With the described technique I passed a trocar initially placing it against the buttock at 45 degree angle.  With my thumb I did a double pop maneuver through the soft tissues layers and then dropping the handle and delivering the tip onto the pulp of my index finger on the patient's left side.  The foramen needle was easily passed and with correct orientation the mesh was attached.  I double checked the location of the needle and was excellent.  I gently rotated the trocar with the mesh sling coming back through the skin  The exact same procedure was attempted on the right. A stab incision was made next the the adductor tendon. While attempting to pass the trocar and pressing into the triangle with my index finger, tissue gave way into the urethra with immediate return of copious clear fluid. I did not feel I was applying excessive pressure at this time.  The foley was removed and we performed a cysto, confirming a small urethral tear injury at around the 8-9 o'clock position of the posterior urethra a few cm distal to the bladder neck. There was no evidence of bladder neck/bladder/  or ureteral involvement. Given the size of the defect, it was felt that it would likely heal with urethral catheterization. Subsequently an 18 fr coude catheter was easily placed into the bladder and balloon inflated with 10 cc sterile water. The sling was removed, and case was terminated.  A drain was placed inside the muscle, and then a 4 layer closure was  used for the perineum.  I was cognizant of the scrotal aspect of the closure with 3-0 Vicryl that also included reapproximation of the bulbospongiosus muscle.  The next layer was deeper subcutaneous suture with 3-0 Vicryl.  A third layer was close to the skin with level with 3-0 Vicryl.  4-0 running mattress sutures used for the perineum.  Dermabond was applied with fluff dressing  The Foley catheter was draining well at the end of the case.  Leg position was good.   Patient will be admitted to the floor, foley catheter likely will need to remain in place for 14 days at which time a RUG or VCUG will be performed.  Donald Pore MD 11/14/2021, 9:46 AM  [facility]  Pager: [phone]

## 2021-11-14 NOTE — Anesthesia Postprocedure Evaluation (Signed)
Anesthesia Post Note  Patient: Juan Hunt  Procedure(s) Performed: failed insertion male sling cystoscopy (Urethra)     Patient location during evaluation: PACU Anesthesia Type: General Level of consciousness: awake and alert Pain management: pain level controlled Vital Signs Assessment: post-procedure vital signs reviewed and stable Respiratory status: spontaneous breathing, nonlabored ventilation, respiratory function stable and patient connected to nasal cannula oxygen Cardiovascular status: blood pressure returned to baseline and stable Postop Assessment: no apparent nausea or vomiting Anesthetic complications: no  No notable events documented.  Last Vitals:  Vitals:   11/14/21 1045 11/14/21 1100  BP: (!) 143/72 130/71  Pulse: (!) 54 (!) 52  Resp: 15 13  Temp:  (!) 36.4 C  SpO2: 100% 100%    Last Pain:  Vitals:   11/14/21 1100  TempSrc:   PainSc: Asleep                 Freja Faro S

## 2021-11-15 ENCOUNTER — Encounter (HOSPITAL_COMMUNITY): Payer: Self-pay | Admitting: Urology

## 2021-11-15 DIAGNOSIS — N393 Stress incontinence (female) (male): Secondary | ICD-10-CM | POA: Diagnosis not present

## 2021-11-15 MED ORDER — MELOXICAM 15 MG PO TABS: 15.0000 mg | ORAL_TABLET | Freq: Every day | ORAL | 1 refills | Status: AC

## 2021-11-15 MED ORDER — OXYBUTYNIN CHLORIDE ER 10 MG PO TB24: 10.0000 mg | ORAL_TABLET | Freq: Every day | ORAL | 1 refills | Status: AC | PRN

## 2021-11-15 MED ORDER — SULFAMETHOXAZOLE-TRIMETHOPRIM 800-160 MG PO TABS: 1.0000 | ORAL_TABLET | Freq: Two times a day (BID) | ORAL | 0 refills | Status: AC

## 2021-11-15 MED ORDER — OXYCODONE HCL 5 MG PO TABS: 5.0000 mg | ORAL_TABLET | Freq: Four times a day (QID) | ORAL | 0 refills | Status: DC | PRN

## 2021-11-15 NOTE — Plan of Care (Signed)
  Problem: Clinical Measurements: Goal: Ability to maintain clinical measurements within normal limits will improve Outcome: Progressing Goal: Will remain free from infection Outcome: Progressing   Problem: Pain Managment: Goal: General experience of comfort will improve Outcome: Progressing   Problem: Safety: Goal: Ability to remain free from injury will improve Outcome: Progressing

## 2021-11-15 NOTE — TOC Transition Note (Signed)
Transition of Care [facility]) - CM/SW Discharge Note   Patient Details  Name: DAQUAWN SEELMAN MRN: [id number] Date of Birth: 11-May-1950  [facility] of [facility]) CM/SW Contact:  Leeroy Cha, RN Phone Number: 11/15/2021, 8:45 AM   Clinical Narrative:    Dc to return home   Final next level of care: Home/Self Care Barriers to Discharge: No Barriers Identified   Patient Goals and CMS Choice Patient states their goals for this hospitalization and ongoing recovery are:: to go home CMS Medicare.gov Compare Post Acute Care list provided to:: Patient Choice offered to / list presented to : Patient  Discharge Placement                       Discharge Plan and Services   Discharge Planning Services: CM Consult                                 Social Determinants of Health (SDOH) Interventions     Readmission Risk Interventions   No data to display
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
## 2021-11-27 DIAGNOSIS — N393 Stress incontinence (female) (male): Secondary | ICD-10-CM | POA: Diagnosis not present

## 2022-01-04 ENCOUNTER — Other Ambulatory Visit: Payer: Self-pay | Admitting: Urology

## 2022-01-05 ENCOUNTER — Telehealth: Payer: Self-pay

## 2022-01-05 NOTE — Telephone Encounter (Addendum)
Received faxed surgical clearance form from [facility]. Pt is scheduled for male urethral sling on 03/06/22.   Lvm asking pt to call back. Need to schedule pre-op exam.   [Dr. Darnell Level has form.]

## 2022-01-12 NOTE — Telephone Encounter (Addendum)
Spoke with pt scheduling pre-op exam on 02/12/22 at 12:00.

## 2022-02-12 ENCOUNTER — Ambulatory Visit (INDEPENDENT_AMBULATORY_CARE_PROVIDER_SITE_OTHER): Payer: PPO | Admitting: Family Medicine

## 2022-02-12 ENCOUNTER — Encounter: Payer: Self-pay | Admitting: Family Medicine

## 2022-02-12 VITALS — BP 134/66 | HR 61 | Temp 97.6°F | Ht 70.0 in | Wt 210.5 lb

## 2022-02-12 DIAGNOSIS — E66811 Obesity, class 1: Secondary | ICD-10-CM

## 2022-02-12 DIAGNOSIS — R7303 Prediabetes: Secondary | ICD-10-CM

## 2022-02-12 DIAGNOSIS — D709 Neutropenia, unspecified: Secondary | ICD-10-CM

## 2022-02-12 DIAGNOSIS — E785 Hyperlipidemia, unspecified: Secondary | ICD-10-CM | POA: Diagnosis not present

## 2022-02-12 DIAGNOSIS — E538 Deficiency of other specified B group vitamins: Secondary | ICD-10-CM | POA: Diagnosis not present

## 2022-02-12 DIAGNOSIS — R32 Unspecified urinary incontinence: Secondary | ICD-10-CM

## 2022-02-12 DIAGNOSIS — Z86718 Personal history of other venous thrombosis and embolism: Secondary | ICD-10-CM

## 2022-02-12 DIAGNOSIS — Z01818 Encounter for other preprocedural examination: Secondary | ICD-10-CM | POA: Diagnosis not present

## 2022-02-12 DIAGNOSIS — E669 Obesity, unspecified: Secondary | ICD-10-CM

## 2022-02-12 LAB — CBC WITH DIFFERENTIAL/PLATELET
Basophils Absolute: 0 10*3/uL (ref 0.0–0.1)
Basophils Relative: 0.7 % (ref 0.0–3.0)
Eosinophils Absolute: 0 10*3/uL (ref 0.0–0.7)
Eosinophils Relative: 0.9 % (ref 0.0–5.0)
HCT: 42.1 % (ref 39.0–52.0)
Hemoglobin: 14 g/dL (ref 13.0–17.0)
Lymphocytes Relative: 48.6 % — ABNORMAL HIGH (ref 12.0–46.0)
Lymphs Abs: 1.6 10*3/uL (ref 0.7–4.0)
MCHC: 33.2 g/dL (ref 30.0–36.0)
MCV: 83.2 fl (ref 78.0–100.0)
Monocytes Absolute: 0.4 10*3/uL (ref 0.1–1.0)
Monocytes Relative: 12.1 % — ABNORMAL HIGH (ref 3.0–12.0)
Neutro Abs: 1.2 10*3/uL — ABNORMAL LOW (ref 1.4–7.7)
Neutrophils Relative %: 37.7 % — ABNORMAL LOW (ref 43.0–77.0)
Platelets: 172 10*3/uL (ref 150.0–400.0)
RBC: 5.07 Mil/uL (ref 4.22–5.81)
RDW: 14.3 % (ref 11.5–15.5)
WBC: 3.2 10*3/uL — ABNORMAL LOW (ref 4.0–10.5)

## 2022-02-12 LAB — LIPID PANEL
Cholesterol: 191 mg/dL (ref 0–200)
HDL: 37.7 mg/dL — ABNORMAL LOW (ref >39.00)
LDL Cholesterol: 137 mg/dL — ABNORMAL HIGH (ref 0–99)
NonHDL: 153.28
Total CHOL/HDL Ratio: 5
Triglycerides: 83 mg/dL (ref 0.0–149.0)
VLDL: 16.6 mg/dL (ref 0.0–40.0)

## 2022-02-12 LAB — BASIC METABOLIC PANEL
BUN: 18 mg/dL (ref 6–23)
CO2: 27 mEq/L (ref 19–32)
Calcium: 9.2 mg/dL (ref 8.4–10.5)
Chloride: 104 mEq/L (ref 96–112)
Creatinine, Ser: 0.87 mg/dL (ref 0.40–1.50)
GFR: 86.61 mL/min (ref >60.00)
Glucose, Bld: 80 mg/dL (ref 70–99)
Potassium: 4.6 mEq/L (ref 3.5–5.1)
Sodium: 138 mEq/L (ref 135–145)

## 2022-02-12 LAB — VITAMIN B12: Vitamin B-12: 281 pg/mL (ref 211–911)

## 2022-02-12 LAB — HEMOGLOBIN A1C: Hgb A1c MFr Bld: 6 % (ref 4.6–6.5)

## 2022-02-12 NOTE — Assessment & Plan Note (Signed)
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  Pending male urethral sling by urology.

## 2022-02-12 NOTE — Assessment & Plan Note (Addendum)
Chronic neutrophilic leukopenia. Update CBC with more regular B12 replacement. Don't anticipate this will delay or affect planned surgery.

## 2022-02-12 NOTE — Patient Instructions (Addendum)
Labs today We will forward results to Dr Cain Sieve. I hope you have a speedy recovery! Schedule physical for after 09/30/2022.  May continue daily antifungal, add daily cortizone-10 steroid over the counter once daily.
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
## 2022-02-12 NOTE — Assessment & Plan Note (Signed)
Discussed weight gain noted.  
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
## 2022-02-12 NOTE — Assessment & Plan Note (Signed)
Update A1c ?

## 2022-02-12 NOTE — Assessment & Plan Note (Addendum)
RCRI = 0 CXR reassuring 08/2021.  Update labs today.  Will defer UA to urology.  Pending results, anticipate adequately low risk to proceed with planned urological surgical intervention for urinary incontinence.

## 2022-02-12 NOTE — Assessment & Plan Note (Signed)
Update levels on regular replacement

## 2022-02-14 DIAGNOSIS — N393 Stress incontinence (female) (male): Secondary | ICD-10-CM | POA: Diagnosis not present

## 2022-02-14 DIAGNOSIS — R32 Unspecified urinary incontinence: Secondary | ICD-10-CM | POA: Diagnosis not present

## 2022-02-14 NOTE — Telephone Encounter (Signed)
[facility] called stating that they have not received the surgical clearance form on this patient that is scheduled to have surgery on 03/06/2022

## 2022-02-14 NOTE — Telephone Encounter (Signed)
Patient was seen for surgical clearance 02/12/22.  Dr. Danise Mina is working on the paperwork and waiting for lab results. Will fax paperwork when completed.   Zona at [facility] notified by telephone that we will fax paperwork over after it is completed and she verbalized understanding.

## 2022-02-15 DIAGNOSIS — R32 Unspecified urinary incontinence: Secondary | ICD-10-CM | POA: Diagnosis not present

## 2022-02-15 DIAGNOSIS — N393 Stress incontinence (female) (male): Secondary | ICD-10-CM | POA: Diagnosis not present

## 2022-02-15 NOTE — Assessment & Plan Note (Signed)
Update FLP off statin. The 10-year ASCVD risk score (Arnett DK, et al., 2019) is: 25.1%   Values used to calculate the score:     Age: 72 years     Sex: Male     Is Non-Hispanic African American: Yes     Diabetic: Yes     Tobacco smoker: No     Systolic Blood Pressure: 956 mmHg     Is BP treated: No     HDL Cholesterol: 37.7 mg/dL     Total Cholesterol: 191 mg/dL

## 2022-02-16 NOTE — Telephone Encounter (Signed)
Faxed form, OV notes and results to [facility].

## 2022-02-23 ENCOUNTER — Encounter (HOSPITAL_COMMUNITY): Payer: Self-pay

## 2022-02-23 NOTE — Patient Instructions (Addendum)
SURGICAL WAITING ROOM VISITATION  Patients having surgery or a procedure may have no more than 2 support people in the waiting area - these visitors may rotate.    Children under the age of 35 must have an adult with them who is not the patient.  Due to an increase in RSV and influenza rates and associated hospitalizations, children ages 74 and under may not visit patients in [place].  If the patient needs to stay at the hospital during part of their recovery, the visitor guidelines for inpatient rooms apply. Pre-op nurse will coordinate an appropriate time for 1 support person to accompany patient in pre-op.  This support person may not rotate.    Please refer to the [facility] website for the visitor guidelines for Inpatients (after your surgery is over and you are in a regular room).       Your procedure is scheduled on: 03-06-22   Report to [facility] Main Entrance    Report to admitting at      [place] AM   Call this number if you have problems the morning of surgery [phone]   Do not eat food   or drink liquids :After Midnight.              If you have questions, please contact your surgeon's office.   FOLLOW ANY ADDITIONAL PRE OP INSTRUCTIONS YOU RECEIVED FROM YOUR SURGEON'S OFFICE!!!     Oral Hygiene is also important to reduce your risk of infection.                                    Remember - BRUSH YOUR TEETH THE MORNING OF SURGERY WITH YOUR REGULAR TOOTHPASTE  DENTURES WILL BE REMOVED PRIOR TO SURGERY PLEASE DO NOT APPLY "Poly grip" OR ADHESIVES!!!   Do NOT smoke after Midnight   Take these medicines the morning of surgery with A SIP OF WATER: atorvastatin  DO NOT TAKE ANY ORAL DIABETIC MEDICATIONS DAY OF YOUR SURGERY  Bring CPAP mask and tubing day of surgery.                              You may not have any metal on your body including hair pins, jewelry, and body piercing             Do not wear lotions, powders,  perfumes/cologne, or deodorant               Men may shave face and neck.   Do not bring valuables to the hospital. [place].   Contacts, glasses, dentures or bridgework may not be worn into surgery.   Bring small overnight bag day of surgery.   DO NOT [place]. PHARMACY WILL DISPENSE MEDICATIONS LISTED ON YOUR MEDICATION LIST TO YOU DURING YOUR ADMISSION [place]!    Patients discharged on the day of surgery will not be allowed to drive home.  Someone NEEDS to stay with you for the first 24 hours after anesthesia.                 Please read over the following fact sheets you were given: IF YOU HAVE [place] [phone]  If you received a COVID test during your pre-op visit  it is requested that you wear a mask when out in public, stay away from anyone that may not be feeling well and notify your surgeon if you develop symptoms. If you test positive for Covid or have been in contact with anyone that has tested positive in the last 10 days please notify you surgeon.     - Preparing for Surgery Before surgery, you can play an important role.  Because skin is not sterile, your skin needs to be as free of germs as possible.  You can reduce the number of germs on your skin by washing with CHG (chlorahexidine gluconate) soap before surgery.  CHG is an antiseptic cleaner which kills germs and bonds with the skin to continue killing germs even after washing. Please DO NOT use if you have an allergy to CHG or antibacterial soaps.  If your skin becomes reddened/irritated stop using the CHG and inform your nurse when you arrive at Short Stay. Do not shave (including legs and underarms) for at least 48 hours prior to the first CHG shower.  You may shave your face/neck. Please follow these instructions carefully:  1.  Shower with CHG Soap the night  before surgery and the  morning of Surgery.  2.  If you choose to wash your hair, wash your hair first as usual with your  normal  shampoo.  3.  After you shampoo, rinse your hair and body thoroughly to remove the  shampoo.                           4.  Use CHG as you would any other liquid soap.  You can apply chg directly  to the skin and wash                       Gently with a scrungie or clean washcloth.  5.  Apply the CHG Soap to your body ONLY FROM THE NECK DOWN.   Do not use on face/ open                           Wound or open sores. Avoid contact with eyes, ears mouth and genitals (private parts).                       Wash face,  Genitals (private parts) with your normal soap.             6.  Wash thoroughly, paying special attention to the area where your surgery  will be performed.  7.  Thoroughly rinse your body with warm water from the neck down.  8.  DO NOT shower/wash with your normal soap after using and rinsing off  the CHG Soap.                9.  Pat yourself dry with a clean towel.            10.  Wear clean pajamas.            11.  Place clean sheets on your bed the night of your first shower and do not  sleep with pets. Day of Surgery : Do not apply any lotions/deodorants the morning of surgery.  Please wear clean clothes to the hospital/surgery center.  FAILURE TO FOLLOW THESE INSTRUCTIONS MAY RESULT IN  THE CANCELLATION OF YOUR SURGERY PATIENT SIGNATURE_________________________________  NURSE SIGNATURE__________________________________  ________________________________________________________________________

## 2022-02-23 NOTE — Progress Notes (Addendum)
PCP - Ria Bush ,MD  preop eval 02-12-22 epic Cardiologist -   PPM/ICD -  Device Orders -  Rep Notified -   Chest x-ray - 08-16-21 epic EKG - 02-12-22 epic Stress Test -  ECHO -  Cardiac Cath -  LABS-cbc/diff, bmp hgbA1c  02-12-22 epic  Sleep Study -  CPAP -   Fasting Blood Sugar -  Checks Blood Sugar _____ times a day  Blood Thinner Instructions: Aspirin Instructions:  ERAS Protcol - PRE-SURGERY Ensure or G2-    COVID vaccine -  Activity-- Anesthesia review:   Patient denies shortness of breath, fever, cough and chest pain at PAT appointment   All instructions explained to the patient, with a verbal understanding of the material. Patient agrees to go over the instructions while at home for a better understanding. Patient also instructed to self quarantine after being tested for COVID-19. The opportunity to ask questions was provided.

## 2022-02-27 ENCOUNTER — Encounter (HOSPITAL_COMMUNITY): Payer: Self-pay

## 2022-02-27 ENCOUNTER — Other Ambulatory Visit: Payer: Self-pay

## 2022-02-27 ENCOUNTER — Encounter (HOSPITAL_COMMUNITY)
Admission: RE | Admit: 2022-02-27 | Discharge: 2022-02-27 | Disposition: A | Discharge status: Home / Self Care | Payer: PPO | Source: Ambulatory Visit | Attending: Urology | Admitting: Urology

## 2022-02-27 VITALS — BP 135/70 | HR 61 | Temp 98.5°F | Resp 16 | Ht 70.0 in | Wt 206.0 lb

## 2022-02-27 DIAGNOSIS — N393 Stress incontinence (female) (male): Secondary | ICD-10-CM | POA: Diagnosis not present

## 2022-02-27 DIAGNOSIS — R7303 Prediabetes: Secondary | ICD-10-CM | POA: Diagnosis not present

## 2022-02-27 DIAGNOSIS — Z01812 Encounter for preprocedural laboratory examination: Secondary | ICD-10-CM | POA: Diagnosis not present

## 2022-02-27 LAB — GLUCOSE, CAPILLARY: Glucose-Capillary: 111 mg/dL — ABNORMAL HIGH (ref 70–99)

## 2022-03-06 ENCOUNTER — Encounter (HOSPITAL_COMMUNITY)
Admission: RE | Disposition: A | Discharge status: Home / Self Care | Payer: Self-pay | Source: Ambulatory Visit | Attending: Urology

## 2022-03-06 ENCOUNTER — Ambulatory Visit (HOSPITAL_BASED_OUTPATIENT_CLINIC_OR_DEPARTMENT_OTHER): Payer: PPO | Admitting: Anesthesiology

## 2022-03-06 ENCOUNTER — Ambulatory Visit (HOSPITAL_COMMUNITY): Payer: PPO | Admitting: Anesthesiology

## 2022-03-06 ENCOUNTER — Observation Stay (HOSPITAL_COMMUNITY)
Admission: RE | Admit: 2022-03-06 | Discharge: 2022-03-07 | Disposition: A | Discharge status: Home / Self Care | Payer: PPO | Source: Ambulatory Visit | Attending: Urology | Admitting: Urology

## 2022-03-06 ENCOUNTER — Other Ambulatory Visit: Payer: Self-pay

## 2022-03-06 ENCOUNTER — Encounter (HOSPITAL_COMMUNITY): Payer: Self-pay | Admitting: Urology

## 2022-03-06 DIAGNOSIS — N393 Stress incontinence (female) (male): Secondary | ICD-10-CM | POA: Diagnosis not present

## 2022-03-06 DIAGNOSIS — Z8546 Personal history of malignant neoplasm of prostate: Secondary | ICD-10-CM | POA: Diagnosis not present

## 2022-03-06 DIAGNOSIS — Z79899 Other long term (current) drug therapy: Secondary | ICD-10-CM | POA: Diagnosis not present

## 2022-03-06 DIAGNOSIS — D1809 Hemangioma of other sites: Secondary | ICD-10-CM | POA: Diagnosis not present

## 2022-03-06 DIAGNOSIS — Z86718 Personal history of other venous thrombosis and embolism: Secondary | ICD-10-CM | POA: Insufficient documentation

## 2022-03-06 DIAGNOSIS — R7303 Prediabetes: Secondary | ICD-10-CM

## 2022-03-06 DIAGNOSIS — G8918 Other acute postprocedural pain: Secondary | ICD-10-CM | POA: Diagnosis present

## 2022-03-06 HISTORY — PX: URETHRAL SLING: SHX2621

## 2022-03-06 SURGERY — CREATION, URETHRAL SLING, MALE
Anesthesia: General

## 2022-03-06 MED ORDER — ACETAMINOPHEN 500 MG PO TABS
1000.0000 mg | ORAL_TABLET | Freq: Once | ORAL | Status: AC
  Administered 2022-03-06: 1000 mg via ORAL
  Filled 2022-03-06: qty 2

## 2022-03-06 MED ORDER — ONDANSETRON HCL 4 MG/2ML IJ SOLN
INTRAMUSCULAR | Status: DC | PRN
  Administered 2022-03-06: 4 mg via INTRAVENOUS

## 2022-03-06 MED ORDER — CEFAZOLIN SODIUM-DEXTROSE 1-4 GM/50ML-% IV SOLN
1.0000 g | Freq: Three times a day (TID) | INTRAVENOUS | Status: AC
  Administered 2022-03-06: 1 g via INTRAVENOUS
  Filled 2022-03-06: qty 50

## 2022-03-06 MED ORDER — LIDOCAINE HCL (PF) 1 % IJ SOLN
INTRAMUSCULAR | Status: DC | PRN
  Administered 2022-03-06: 5 mL

## 2022-03-06 MED ORDER — SODIUM CHLORIDE 0.9 % IR SOLN
Status: DC | PRN
  Administered 2022-03-06: 1000 mL via INTRAVESICAL

## 2022-03-06 MED ORDER — MIDAZOLAM HCL 2 MG/2ML IJ SOLN
INTRAMUSCULAR | Status: DC | PRN
  Administered 2022-03-06: 2 mg via INTRAVENOUS

## 2022-03-06 MED ORDER — ORAL CARE MOUTH RINSE
15.0000 mL | Freq: Once | OROMUCOSAL | Status: AC
  Administered 2022-03-06: 15 mL via OROMUCOSAL

## 2022-03-06 MED ORDER — KETOROLAC TROMETHAMINE 15 MG/ML IJ SOLN
15.0000 mg | Freq: Once | INTRAMUSCULAR | Status: AC | PRN
  Administered 2022-03-06: 15 mg via INTRAVENOUS

## 2022-03-06 MED ORDER — LIDOCAINE 2% (20 MG/ML) 5 ML SYRINGE
INTRAMUSCULAR | Status: DC | PRN
  Administered 2022-03-06: 80 mg via INTRAVENOUS

## 2022-03-06 MED ORDER — ACETAMINOPHEN 500 MG PO TABS: 1000.0000 mg | ORAL_TABLET | Freq: Four times a day (QID) | ORAL | 1 refills | Status: AC | PRN

## 2022-03-06 MED ORDER — LIDOCAINE HCL (PF) 1 % IJ SOLN
INTRAMUSCULAR | Status: AC
  Filled 2022-03-06: qty 30

## 2022-03-06 MED ORDER — OXYCODONE HCL 5 MG PO TABS
ORAL_TABLET | ORAL | Status: AC
  Filled 2022-03-06: qty 1

## 2022-03-06 MED ORDER — OXYCODONE HCL 5 MG PO TABS
5.0000 mg | ORAL_TABLET | ORAL | Status: DC | PRN
  Administered 2022-03-06 (×2): 5 mg via ORAL
  Filled 2022-03-06: qty 1

## 2022-03-06 MED ORDER — BUPIVACAINE HCL (PF) 0.5 % IJ SOLN
INTRAMUSCULAR | Status: DC | PRN
  Administered 2022-03-06: 5 mL

## 2022-03-06 MED ORDER — LIDOCAINE HCL (PF) 2 % IJ SOLN
INTRAMUSCULAR | Status: AC
  Filled 2022-03-06: qty 5

## 2022-03-06 MED ORDER — ORAL CARE MOUTH RINSE: 15.0000 mL | OROMUCOSAL | Status: DC | PRN

## 2022-03-06 MED ORDER — FENTANYL CITRATE PF 50 MCG/ML IJ SOSY
PREFILLED_SYRINGE | INTRAMUSCULAR | Status: AC
  Filled 2022-03-06: qty 2

## 2022-03-06 MED ORDER — CELECOXIB 200 MG PO CAPS
200.0000 mg | ORAL_CAPSULE | Freq: Two times a day (BID) | ORAL | Status: DC
  Administered 2022-03-06 – 2022-03-07 (×2): 200 mg via ORAL
  Filled 2022-03-06 (×2): qty 1

## 2022-03-06 MED ORDER — CELECOXIB 200 MG PO CAPS: 200.0000 mg | ORAL_CAPSULE | Freq: Two times a day (BID) | ORAL | 1 refills | Status: DC

## 2022-03-06 MED ORDER — CEFAZOLIN SODIUM-DEXTROSE 2-4 GM/100ML-% IV SOLN
2.0000 g | INTRAVENOUS | Status: AC
  Administered 2022-03-06: 2 g via INTRAVENOUS
  Filled 2022-03-06: qty 100

## 2022-03-06 MED ORDER — ONDANSETRON HCL 4 MG/2ML IJ SOLN
INTRAMUSCULAR | Status: AC
  Filled 2022-03-06: qty 2

## 2022-03-06 MED ORDER — CHLORHEXIDINE GLUCONATE CLOTH 2 % EX PADS
6.0000 | MEDICATED_PAD | Freq: Every day | CUTANEOUS | Status: DC
  Administered 2022-03-07: 6 via TOPICAL

## 2022-03-06 MED ORDER — FENTANYL CITRATE PF 50 MCG/ML IJ SOSY
PREFILLED_SYRINGE | INTRAMUSCULAR | Status: AC
  Filled 2022-03-06: qty 1

## 2022-03-06 MED ORDER — MIDAZOLAM HCL 2 MG/2ML IJ SOLN
INTRAMUSCULAR | Status: AC
  Filled 2022-03-06: qty 2

## 2022-03-06 MED ORDER — PROPOFOL 10 MG/ML IV BOLUS
INTRAVENOUS | Status: DC | PRN
  Administered 2022-03-06: 180 mg via INTRAVENOUS

## 2022-03-06 MED ORDER — KETOROLAC TROMETHAMINE 15 MG/ML IJ SOLN
INTRAMUSCULAR | Status: AC
  Filled 2022-03-06: qty 1

## 2022-03-06 MED ORDER — GENTAMICIN SULFATE 40 MG/ML IJ SOLN
160.0000 mg | INTRAVENOUS | Status: AC
  Administered 2022-03-06: 160 mg via INTRAVENOUS
  Filled 2022-03-06: qty 4

## 2022-03-06 MED ORDER — FENTANYL CITRATE (PF) 100 MCG/2ML IJ SOLN
INTRAMUSCULAR | Status: DC | PRN
  Administered 2022-03-06 (×2): 25 ug via INTRAVENOUS

## 2022-03-06 MED ORDER — CHLORHEXIDINE GLUCONATE 0.12 % MT SOLN: 15.0000 mL | Freq: Once | OROMUCOSAL | Status: AC

## 2022-03-06 MED ORDER — CHLORHEXIDINE GLUCONATE 4 % EX LIQD: Freq: Once | CUTANEOUS | Status: DC

## 2022-03-06 MED ORDER — FENTANYL CITRATE PF 50 MCG/ML IJ SOSY
25.0000 ug | PREFILLED_SYRINGE | INTRAMUSCULAR | Status: DC | PRN
  Administered 2022-03-06 (×3): 50 ug via INTRAVENOUS

## 2022-03-06 MED ORDER — OXYCODONE HCL 5 MG PO TABS: 5.0000 mg | ORAL_TABLET | Freq: Four times a day (QID) | ORAL | 0 refills | Status: DC | PRN

## 2022-03-06 MED ORDER — ACETAMINOPHEN 500 MG PO TABS
1000.0000 mg | ORAL_TABLET | Freq: Four times a day (QID) | ORAL | Status: DC
  Administered 2022-03-06 – 2022-03-07 (×3): 1000 mg via ORAL
  Filled 2022-03-06 (×3): qty 2

## 2022-03-06 MED ORDER — ONDANSETRON HCL 4 MG/2ML IJ SOLN: 4.0000 mg | INTRAMUSCULAR | Status: DC | PRN

## 2022-03-06 MED ORDER — LACTATED RINGERS IV SOLN: INTRAVENOUS | Status: DC

## 2022-03-06 MED ORDER — HEMOSTATIC AGENTS (NO CHARGE) OPTIME
TOPICAL | Status: DC | PRN
  Administered 2022-03-06: 1 via TOPICAL

## 2022-03-06 MED ORDER — CEFAZOLIN SODIUM-DEXTROSE 2-4 GM/100ML-% IV SOLN: 2.0000 g | INTRAVENOUS | Status: DC

## 2022-03-06 MED ORDER — DEXAMETHASONE SODIUM PHOSPHATE 10 MG/ML IJ SOLN
INTRAMUSCULAR | Status: AC
  Filled 2022-03-06: qty 1

## 2022-03-06 MED ORDER — 0.9 % SODIUM CHLORIDE (POUR BTL) OPTIME
TOPICAL | Status: DC | PRN
  Administered 2022-03-06: 1000 mL

## 2022-03-06 MED ORDER — HYDROMORPHONE HCL 1 MG/ML IJ SOLN: 0.5000 mg | INTRAMUSCULAR | Status: DC | PRN

## 2022-03-06 MED ORDER — SODIUM CHLORIDE 0.45 % IV SOLN: INTRAVENOUS | Status: DC

## 2022-03-06 MED ORDER — PROPOFOL 10 MG/ML IV BOLUS
INTRAVENOUS | Status: AC
  Filled 2022-03-06: qty 20

## 2022-03-06 MED ORDER — BUPIVACAINE HCL (PF) 0.5 % IJ SOLN
INTRAMUSCULAR | Status: AC
  Filled 2022-03-06: qty 30

## 2022-03-06 MED ORDER — STERILE WATER FOR IRRIGATION IR SOLN
Status: DC | PRN
  Administered 2022-03-06: 500 mL

## 2022-03-06 MED ORDER — FENTANYL CITRATE (PF) 100 MCG/2ML IJ SOLN
INTRAMUSCULAR | Status: AC
  Filled 2022-03-06: qty 2

## 2022-03-06 MED ORDER — SULFAMETHOXAZOLE-TRIMETHOPRIM 800-160 MG PO TABS: 1.0000 | ORAL_TABLET | Freq: Two times a day (BID) | ORAL | 0 refills | Status: DC

## 2022-03-06 MED ORDER — DEXAMETHASONE SODIUM PHOSPHATE 10 MG/ML IJ SOLN
INTRAMUSCULAR | Status: DC | PRN
  Administered 2022-03-06: 4 mg via INTRAVENOUS

## 2022-03-06 MED ORDER — ONDANSETRON HCL 4 MG/2ML IJ SOLN: 4.0000 mg | Freq: Once | INTRAMUSCULAR | Status: DC | PRN

## 2022-03-06 MED ORDER — AMISULPRIDE (ANTIEMETIC) 5 MG/2ML IV SOLN: 10.0000 mg | Freq: Once | INTRAVENOUS | Status: DC | PRN

## 2022-03-06 MED ORDER — ATORVASTATIN CALCIUM 40 MG PO TABS
40.0000 mg | ORAL_TABLET | Freq: Every day | ORAL | Status: DC
  Administered 2022-03-06 – 2022-03-07 (×2): 40 mg via ORAL
  Filled 2022-03-06 (×2): qty 1

## 2022-03-06 SURGICAL SUPPLY — 59 items
ADH SKN CLS APL DERMABOND .7 (GAUZE/BANDAGES/DRESSINGS) ×1
APL PRP STRL LF DISP 70% ISPRP (MISCELLANEOUS) ×2
BAG COUNTER SPONGE SURGICOUNT (BAG) IMPLANT
BAG DRN RND TRDRP ANRFLXCHMBR (UROLOGICAL SUPPLIES) ×1
BAG SPNG CNTER NS LX DISP (BAG)
BAG URINE DRAIN 2000ML AR STRL (UROLOGICAL SUPPLIES) ×1 IMPLANT
BLADE SURG 15 STRL LF DISP TIS (BLADE) ×1 IMPLANT
BLADE SURG 15 STRL SS (BLADE) ×1
BNDG GAUZE DERMACEA FLUFF 4 (GAUZE/BANDAGES/DRESSINGS) ×1 IMPLANT
BNDG GZE DERMACEA 4 6PLY (GAUZE/BANDAGES/DRESSINGS) ×1
BRIEF MESH DISP LRG (UNDERPADS AND DIAPERS) ×1 IMPLANT
CATH FOLEY 2WAY SLVR  5CC 14FR (CATHETERS) ×1
CATH FOLEY 2WAY SLVR 5CC 14FR (CATHETERS) ×1 IMPLANT
CATH TIEMANN FOLEY 18FR 5CC (CATHETERS) IMPLANT
CHLORAPREP W/TINT 26 (MISCELLANEOUS) ×2 IMPLANT
COVER BACK TABLE 60X90IN (DRAPES) ×1 IMPLANT
COVER MAYO STAND STRL (DRAPES) ×1 IMPLANT
COVER SURGICAL LIGHT HANDLE (MISCELLANEOUS) ×1 IMPLANT
DERMABOND ADVANCED .7 DNX12 (GAUZE/BANDAGES/DRESSINGS) ×2 IMPLANT
DRAPE UNDERBUTTOCKS STRL (DISPOSABLE) ×1 IMPLANT
DRSG TELFA 3X8 NADH STRL (GAUZE/BANDAGES/DRESSINGS) ×1 IMPLANT
DRSG TELFA PLUS 4X6 ADH ISLAND (GAUZE/BANDAGES/DRESSINGS) IMPLANT
ELECT PENCIL ROCKER SW 15FT (MISCELLANEOUS) ×1 IMPLANT
GAUZE 4X4 16PLY ~~LOC~~+RFID DBL (SPONGE) ×2 IMPLANT
GLOVE BIOGEL M 7.0 STRL (GLOVE) ×1 IMPLANT
GLOVE BIOGEL PI IND STRL 7.0 (GLOVE) ×1 IMPLANT
GOWN STRL REUS W/ TWL XL LVL3 (GOWN DISPOSABLE) ×1 IMPLANT
GOWN STRL REUS W/TWL XL LVL3 (GOWN DISPOSABLE) ×1
HEMOSTAT ARISTA ABSORB 3G PWDR (HEMOSTASIS) IMPLANT
HOLDER FOLEY CATH W/STRAP (MISCELLANEOUS) ×1 IMPLANT
KIT BASIN OR (CUSTOM PROCEDURE TRAY) ×1 IMPLANT
LUBRICANT JELLY K Y 4OZ (MISCELLANEOUS) ×1 IMPLANT
NDL HYPO 22X1.5 SAFETY MO (MISCELLANEOUS) ×1 IMPLANT
NDL SPNL 22GX3.5 QUINCKE BK (NEEDLE) ×1 IMPLANT
NEEDLE HYPO 22X1.5 SAFETY MO (MISCELLANEOUS) ×1 IMPLANT
NEEDLE SAFETY HYPO 22GAX1.5 (MISCELLANEOUS) ×1
NEEDLE SPNL 22GX3.5 QUINCKE BK (NEEDLE) ×1 IMPLANT
PLUG CATH AND CAP STER (CATHETERS) ×1 IMPLANT
PROTECTOR NERVE ULNAR (MISCELLANEOUS) ×1 IMPLANT
SHEET LAVH (DRAPES) ×1 IMPLANT
SLING ADVANCE MALE SYSTEM (Mesh General) IMPLANT
SPIKE FLUID TRANSFER (MISCELLANEOUS) IMPLANT
STAPLER VISISTAT 35W (STAPLE) ×1 IMPLANT
SUT MNCRL AB 4-0 PS2 18 (SUTURE) ×1 IMPLANT
SUT SILK 2 0 30  PSL (SUTURE)
SUT SILK 2 0 30 PSL (SUTURE) IMPLANT
SUT VIC AB 2-0 SH 27 (SUTURE)
SUT VIC AB 2-0 SH 27X BRD (SUTURE) IMPLANT
SUT VIC AB 3-0 SH 27 (SUTURE) ×4
SUT VIC AB 3-0 SH 27XBRD (SUTURE) ×4 IMPLANT
SUT VIC AB 4-0 PS2 27 (SUTURE) IMPLANT
SUT VIC AB 4-0 RB1 27 (SUTURE) ×1
SUT VIC AB 4-0 RB1 27XBRD (SUTURE) IMPLANT
SYR 10ML LL (SYRINGE) ×1 IMPLANT
SYR 20ML LL LF (SYRINGE) ×1 IMPLANT
SYR BULB IRRIG 60ML STRL (SYRINGE) ×1 IMPLANT
TOWEL OR 17X26 10 PK STRL BLUE (TOWEL DISPOSABLE) ×2 IMPLANT
TUBING CONNECTING 10 (TUBING) ×1 IMPLANT
YANKAUER SUCT BULB TIP 10FT TU (MISCELLANEOUS) ×1 IMPLANT

## 2022-03-06 NOTE — Discharge Instructions (Signed)
Male Sling Post Operative Instructions Dr. Donald Pore  You may notice some swelling or black and blue bruising. This is very common, and may increase slightly over the next several days. Typically it will begin to improve 1-2 weeks after surgery You may take a shower 48 hours after surgery. Avoid submerging yourself completely in water (bath tubs, hot tubs, swimming pools, etc) until 1 month after the surgery. To clean the incision, let soapy water gently wash over the area and pat lightly. Use supportive, tight-fitting underwear for the first two weeks after surgery. For example, jock straps, sliding shorts, or briefs Apply ice packs 20 minutes on/20 minutes off for at least the first 2 days following surgery. This will help  minimize swelling and discomfort. After the first few days you can continue using ice packs if they are helpful  The skin incision is closed with sutures and purple skin glue. Both of these things dissolve on their own over the course of several weeks.   Avoid lifting anything heavier than 15 pounds for 1 month Do not put any direct pressure on the incision for long periods of time for the first 6 weeks following surgery. For example, do not ride a bicycle, motorcycle, ATV, or horse.  Sitting on a soft cushion, "donut" cushion, or pillow can help with the discomfort Avoid all sexual contact for 2 weeks following the surgery. You will be prescribed an antibiotic following the surgery; please take this as prescribed.  For pain post-operatively, you will typically be prescribed 2 medicines. The first is an anti-inflammatory medication (Celebrex, Toradol, Meloxicam). The second is narcotic pain medication (Tramadol, Oxycodone). You can take these as needed, and can supplement with over the counter tylenol. I recommend limiting the amount of narcotic pain medication you take as these medications can cause constipation and in rare cases, addiction.

## 2022-03-06 NOTE — Interval H&P Note (Signed)
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  The various methods of treatment have been discussed with the patient and family. After consideration of risks, benefits and other options for treatment, the patient has consented to  Procedure(s) with comments: MALE URETHRAL SLING (N/A) - 100 MINUTES NEEDED FOR CASE as a surgical intervention.  The patient's history has been reviewed, patient examined, no change in status, stable for surgery.  I have reviewed the patient's chart and labs.  Questions were answered to the patient's satisfaction.  Risks including bleeding, infection, sling infection, sling erosion, urinary retention, failure to improve incontinence or make dry, damage to adjacent structures, pain. Patient voiced understanding   Claryssa Sandner L Lee Kalt

## 2022-03-06 NOTE — Op Note (Addendum)
Preoperative diagnosis:  1. Stress urinary incontinence 2. History of prostate cancer s/p prostatectomy  Postoperative diagnosis: same  Procedure(s): 1. Insertion of male sling (advance XP) 2. Flexible cystoscopy  Surgeon: Dr. Donald Pore Assistants: Dr Nicki Reaper Macdiarmid Dr Josph Macho  Anesthesia: General  Complications: none  EBL: 20 cc  Intraoperative findings:  -Muscle scarred to urethra requiring prolonged dissection. A small opening in the bulb was made and repaired primarily -Good bulbar urethra displacement and urethral coaptation after placement and tightening of sling -on Cysto, there was a small hemangioma on the posterior dome of the bladder  Indication: Juan Hunt is a 72 y.o.yo M who with stress incontinence s/p prostatectomy. A previous sling attempt was not successful and the procedure was aborted. After an extensive discussion, he is interested in proceeding with another attempt at sling placement  Description of procedure:  After appropriate consent had been obtained, the patient was brought to the operative suite where anesthesia was induced. The patient was prepped and draped in the usual sterile fashion. Extra care was taken with leg positioning to minimize the risk of compartment syndrome neuropathy and deep vein thrombosis.  Preoperative antibiotics were given.  I an made approximately 5 cm incision in the perineum involving the lower aspect of the scrotum.  I dissected down through soft tissue and mobilized the subcutaneous tissue from the bulbospongiosus midline. The bulbospongiosus muscle was very stuck to the urethra. We carefully dissected this free. A small rent in the bulb was made and closed with 2 interrupted 4-0 vicryl sutures. Ultimately, the urethra was exposed well, and the peroneal tendon that was sharply taken down with Metzenbaum scissors.  There was easily at least 2 to 4 cm of mobility of the bulbar urethra towards the patient's head.   I was very pleased with identification and mobilization. I placed a 3-0 Vicryl where the tendon was initially attached to the bulb  I was very diligent at the beginning of the case and throughout the case to feel the abductor tendon relative to the obturator foramen.  I marked the area of entrance of the foramen needle with a marking pen.  I used 22 g foramen needle to find the inferior rami and then through the foramen itself below the tendon.  I kept the angle appropriate with the patient in Trendelenburg.  I made a scalpel incision on the patient's left side approximately 1 cm in length.  With appropriate exposure I placed my finger in the upper aspect of the triangle on the patient's left side.  With the described technique I passed a trocar initially placing it against the buttock at 45 degree angle.  With my thumb I did a double pop maneuver through the soft tissues layers and then dropping the handle and delivering the tip onto the pulp of my index finger on the patient's left side.  The foramen needle was easily passed and with correct orientation the mesh was attached.  I double checked the location of the needle and was excellent.  I gently rotated the trocar with the mesh sling coming back through the skin  I did the exact same procedure on the right.  The mesh was attached in correct orientation.  Gently pulling on both slings and with my finger between the bulb and the mesh I gently brought the mesh close to the bulbar urethra.   Before I sewed the mesh to the bulb I cystoscoped the patient to make certain there was not an injury.  The bladder mucosa and trigone were normal. There was a small hemangioma appearing lesion at the posterior dome of the bladder. No injury to bladder neck or urethra.  (addendum:error deleted).  There is no inflammation or infection associated  The 3-0 Vicryl that was initially placed through the sponge was brought out through the distal aspect of the mesh.  I then  gently synched the mesh down to the bulb.  I placed two 3-0 Vicryl's in the distal aspect of the mesh through the bulbospongiosus.  I then passed another in the midline.  I was very happy with the orientation of the mesh.  With my finger pressing on the mesh I gently pulled the trocars on the left and on the right and gradually sinched the bulbar urethra rotating it towards the patient's head approximately 50% of the final rotational distance.  My assistant placed the cystoscope just distal to the bladder neck and then I tensioned the sling.  I placed my finger in the appropriate location and I could see where it was indenting and coapting the urethra.  I gently pulled each side of the mesh slowly until the urethra occluded.  I did not pull further.  I was very happy with the coaptation of the urethra and stopped at that point  Visually and palpably there was excellent rotation of the bulbar urethra.  I then gently tied down the initial 3-0 Vicryl suture which was attaching the distal aspect of the mesh to the urethra  The sling was cut below each blue dot.  It had been rolled by my assistant prior to help for its release.  I irrigated.  I placed a hemostat deeply across the silicone and white sheath removing them easily.  This was done on both sides.  The mesh was cut below the skin level in the inguinal creases.  I closed each inguinal incision with 2 interrupted 4-0 Vicryl followed by Dermabond.  A 4 layer closure was used for the perineum.  I was cognizant of the scrotal aspect of the closure with 3-0 Vicryl that also included reapproximation of the bulbospongiosus muscle.  The next layer was deeper subcutaneous suture with 3-0 Vicryl.  A third layer was closed with 3-0 Vicryl.  4-0 running sutures used for the perineum.  Dermabond was applied with fluff dressing  The Foley catheter was draining well at the end of the case.  Leg position was good. I was very pleased with the surgery and hopefully it  reaches the patient's treatment goal  Donald Pore MD 03/06/2022, 10:14 AM  [facility]  Pager: [phone]

## 2022-03-06 NOTE — Anesthesia Postprocedure Evaluation (Signed)
Anesthesia Post Note  Patient: Juan Hunt  Procedure(s) Performed: MALE URETHRAL SLING     Patient location during evaluation: PACU Anesthesia Type: General Level of consciousness: awake Pain management: pain level controlled Vital Signs Assessment: post-procedure vital signs reviewed and stable Respiratory status: spontaneous breathing, nonlabored ventilation and respiratory function stable Cardiovascular status: blood pressure returned to baseline and stable Postop Assessment: no apparent nausea or vomiting Anesthetic complications: no   No notable events documented.  Last Vitals:  Vitals:   03/06/22 1200 03/06/22 1300  BP: 134/80 (!) 142/78  Pulse: (!) 49 (!) 50  Resp: 12 15  Temp:    SpO2: 100% 100%    Last Pain:  Vitals:   03/06/22 1340  TempSrc:   PainSc: 10-Worst pain ever                 Asaf Elmquist P Karthika Glasper

## 2022-03-06 NOTE — Transfer of Care (Signed)
Immediate Anesthesia Transfer of Care Note  Patient: Juan Hunt  Procedure(s) Performed: MALE URETHRAL SLING  Patient Location: PACU  Anesthesia Type:General  Level of Consciousness: drowsy  Airway & Oxygen Therapy: Patient Spontanous Breathing and Patient connected to face mask oxygen  Post-op Assessment: Report given to RN, Post -op Vital signs reviewed and stable, and Patient moving all extremities X 4  Post vital signs: Reviewed and stable  Last Vitals:  Vitals Value Taken Time  BP 117/66   Temp    Pulse 47 03/06/22 1025  Resp 14 03/06/22 1025  SpO2 100 % 03/06/22 1025  Vitals shown include unvalidated device data.  Last Pain:  Vitals:   03/06/22 0629  TempSrc:   PainSc: 0-No pain         Complications: No notable events documented.

## 2022-03-06 NOTE — Anesthesia Procedure Notes (Signed)
Procedure Name: LMA Insertion Date/Time: 03/06/2022 7:49 AM  Performed by: Niel Hummer, CRNAPre-anesthesia Checklist: Patient identified, Emergency Drugs available, Suction available and Patient being monitored Patient Re-evaluated:Patient Re-evaluated prior to induction Oxygen Delivery Method: Circle system utilized Preoxygenation: Pre-oxygenation with 100% oxygen Induction Type: IV induction LMA: LMA inserted LMA Size: 4.0 Number of attempts: 1 Dental Injury: Teeth and Oropharynx as per pre-operative assessment

## 2022-03-06 NOTE — Anesthesia Preprocedure Evaluation (Addendum)
Anesthesia Evaluation  Patient identified by MRN, date of birth, ID band Patient awake    Reviewed: Allergy & Precautions, NPO status , Patient's Chart, lab work & pertinent test results  Airway Mallampati: III  TM Distance: >3 FB Neck ROM: Full    Dental no notable dental hx.    Pulmonary former smoker   Pulmonary exam normal        Cardiovascular + DVT  Normal cardiovascular exam     Neuro/Psych negative neurological ROS  negative psych ROS   GI/Hepatic negative GI ROS, Neg liver ROS,,,  Endo/Other  negative endocrine ROS    Renal/GU Renal disease     Musculoskeletal  (+) Arthritis ,    Abdominal   Peds  Hematology negative hematology ROS (+)   Anesthesia Other Findings STRESS URINARY INCONTINENCE AFTER PROSTATECTOMY  Reproductive/Obstetrics                             Anesthesia Physical Anesthesia Plan  ASA: 2  Anesthesia Plan: General   Post-op Pain Management:    Induction: Intravenous  PONV Risk Score and Plan: 3 and Ondansetron, Dexamethasone, Midazolam and Treatment may vary due to age or medical condition  Airway Management Planned:   Additional Equipment:   Intra-op Plan:   Post-operative Plan: Extubation in OR  Informed Consent: I have reviewed the patients History and Physical, chart, labs and discussed the procedure including the risks, benefits and alternatives for the proposed anesthesia with the patient or authorized representative who has indicated his/her understanding and acceptance.     Dental advisory given  Plan Discussed with: CRNA  Anesthesia Plan Comments:        Anesthesia Quick Evaluation

## 2022-03-07 ENCOUNTER — Encounter (HOSPITAL_COMMUNITY): Payer: Self-pay | Admitting: Urology

## 2022-03-07 DIAGNOSIS — N393 Stress incontinence (female) (male): Secondary | ICD-10-CM | POA: Diagnosis not present

## 2022-03-07 MED ORDER — DOCUSATE SODIUM 100 MG PO CAPS
100.0000 mg | ORAL_CAPSULE | Freq: Every day | ORAL | Status: DC
  Administered 2022-03-07: 100 mg via ORAL
  Filled 2022-03-07: qty 1

## 2022-03-07 MED ORDER — SENNOSIDES-DOCUSATE SODIUM 8.6-50 MG PO TABS
1.0000 | ORAL_TABLET | Freq: Two times a day (BID) | ORAL | Status: DC
  Administered 2022-03-07: 1 via ORAL
  Filled 2022-03-07: qty 1

## 2022-03-07 NOTE — Discharge Summary (Signed)
[facility] Discharge Summary  Admit date: 03/06/2022  Discharge date and time: 03/07/22   Discharge to: Home  Discharge Service: Urology  Discharge Attending Physician:  Dr. Cain Sieve, MD  Discharge  Diagnoses: Post-op pain  Secondary Diagnosis: Principal Problem:   Post-op pain   OR Procedures: Procedure(s): MALE URETHRAL SLING 03/06/2022   Ancillary Procedures: None   Discharge Day Services: The patient was seen and examined by the Urology team both in the morning and immediately prior to discharge.  Vital signs and laboratory values were stable and within normal limits.  The physical exam was benign and unchanged and all surgical wounds were examined.  Discharge instructions were explained and all questions answered.  Subjective  No acute events overnight. Pain Controlled. No fever or chills.  Objective Patient Vitals for the past 8 hrs:  BP Temp Temp src Pulse Resp SpO2  03/07/22 0527 120/60 98.2 F (36.8 C) Oral (!) 59 18 98 %  03/06/22 2348 (!) 126/57 98.2 F (36.8 C) Oral (!) 58 18 96 %   No intake/output data recorded.  General Appearance:        No acute distress Lungs:                       Normal work of breathing on room air Heart:                                Regular rate and rhythm Abdomen:                         Soft, non-tender, non-distended Extremities:                      Warm and well perfused GU:          Voiding spontaneously, Perineal incision clean/dry/intact   Hospital Course:  The patient is a 73 year old male with history of prostate cancer and post-prostatectomy stress urinary incontinence, s/p aborted attempt at male urethral sling [id number] due to complication of urethral injury.  The patient underwent insertion of male urethral sling (advance XP) and flexible cystoscopy on 03/06/2022.  The patient tolerated the procedure well, was extubated in the OR, and afterwards was taken to the PACU for routine post-surgical care. When stable the  patient was transferred to the floor.   The patient did well postoperatively.  The patient's diet was slowly advanced and at the time of discharge was tolerating a regular diet.  The patient was discharged home 1 Day Post-Op, at which point was tolerating a regular solid diet, was able to void spontaneously, have adequate pain control with P.O. pain medication, and could ambulate without difficulty. The patient will follow up with [place] for post op check.   Condition at Discharge: Improved  Discharge Medications:  Allergies as of 03/07/2022       Reactions   Pork-derived Products    Religious reasons.         Medication List     TAKE these medications    acetaminophen 500 MG tablet Commonly known as: TYLENOL Take 2 tablets (1,000 mg total) by mouth every 6 (six) hours as needed.   atorvastatin 40 MG tablet Commonly known as: LIPITOR Take 40 mg by mouth daily.   B-12 2500 MCG Tabs Take 2,500 mcg by mouth daily.   BENEFIBER PO Take 1 Scoop by mouth daily.  CAYENNE PLUS GARLIC PO Take 1 capsule by mouth daily.   celecoxib 200 MG capsule Commonly known as: CELEBREX Take 1 capsule (200 mg total) by mouth 2 (two) times daily.   docusate sodium 100 MG capsule Commonly known as: COLACE Take 100 mg by mouth daily as needed for constipation.   meloxicam 15 MG tablet Commonly known as: MOBIC Take 1 tablet (15 mg total) by mouth daily. What changed:  when to take this reasons to take this   OVER THE COUNTER MEDICATION Apply 1 Application topically daily as needed (itching/rash). Exederm   oxyCODONE 5 MG immediate release tablet Commonly known as: Roxicodone Take 1 tablet (5 mg total) by mouth every 6 (six) hours as needed for severe pain.   PUMPKIN SEED OIL PO Take 1,000 mg by mouth daily.   SAW PALMETTO PO Take 250 mg by mouth daily.   sulfamethoxazole-trimethoprim 800-160 MG tablet Commonly known as: BACTRIM DS Take 1 tablet by mouth 2 (two) times daily.    Vitamin D3 250 MCG (10000 UT) Tabs Generic drug: Cholecalciferol Take 20,000 Units by mouth daily.

## 2022-03-07 NOTE — Progress Notes (Signed)
  Transition of Care [facility]) Screening Note   Patient Details  Name: Juan Hunt Date of Birth: 03/03/1950   Transition of Care [facility]) CM/SW Contact:    Henrietta Dine, RN Phone Number: 03/07/2022, 9:34 AM    Transition of Care Department [facility]) has reviewed patient and no TOC needs have been identified at this time. We will continue to monitor patient advancement through interdisciplinary progression rounds. If new patient transition needs arise, please place a TOC consult.

## 2022-03-15 DIAGNOSIS — N393 Stress incontinence (female) (male): Secondary | ICD-10-CM | POA: Diagnosis not present

## 2022-04-20 ENCOUNTER — Encounter: Payer: Self-pay | Admitting: Family Medicine

## 2022-04-20 ENCOUNTER — Ambulatory Visit (INDEPENDENT_AMBULATORY_CARE_PROVIDER_SITE_OTHER): Payer: PPO | Admitting: Family Medicine

## 2022-04-20 VITALS — BP 136/72 | HR 63 | Temp 97.5°F | Ht 70.0 in | Wt 217.2 lb

## 2022-04-20 DIAGNOSIS — H547 Unspecified visual loss: Secondary | ICD-10-CM | POA: Diagnosis not present

## 2022-04-20 NOTE — Patient Instructions (Addendum)
We will refer you to eye doctor in [place].  Good to see you today

## 2022-04-20 NOTE — Progress Notes (Signed)
Ph: [phone]       Fax: [phone]   Patient ID: Juan Hunt, male    DOB: 11-02-1950, 72 y.o.   MRN: [id number]  This visit was conducted in person.  BP 136/72   Pulse 63   Temp (!) 97.5 F (36.4 C) (Temporal)   Ht 5\' 10"  (1.778 m)   Wt 217 lb 4 oz (98.5 kg)   SpO2 96%   BMI 31.17 kg/m    Vision Screening   Right eye Left eye Both eyes  Without correction     With correction 20/70 20/50 20/50     CC: R eye vision trouble  Subjective:   HPI: Juan Hunt is a 72 y.o. male presenting on 04/20/2022 for Eye Problem (C/o worsening vision- especially in R. Seen at [facility] about 6-8 mos ago, told early signs of cataracts but not enough issue to address. Wants to discuss referral. )   Wearing new bifocals he got 7-8 months ago. Still struggling to see R>L eyes.  Notes progressive vision difficulty - dim, cloudy vision. R>L  Occasional lateral eye discomfort and watering of eyes.  Notes mild photophobia/photosensitivity - ie wears hat to dining room table  Saw [facility] late last year - told early cataracts but shouldn't affect vision.  No glaucoma.  No red eye. No headaches.      Relevant past medical, surgical, family and social history reviewed and updated as indicated. Interim medical history since our last visit reviewed. Allergies and medications reviewed and updated. Outpatient Medications Prior to Visit  Medication Sig Dispense Refill   acetaminophen (TYLENOL) 500 MG tablet Take 2 tablets (1,000 mg total) by mouth every 6 (six) hours as needed. 40 tablet 1   atorvastatin (LIPITOR) 40 MG tablet Take 40 mg by mouth daily.     Capsicum-Garlic (CAYENNE PLUS GARLIC PO) Take 1 capsule by mouth daily.     celecoxib (CELEBREX) 200 MG capsule Take 1 capsule (200 mg total) by mouth 2 (two) times daily. 28 capsule 1   Cholecalciferol (VITAMIN D3) 250 MCG (10000 UT) TABS Take 20,000 Units by mouth daily.     Cyanocobalamin (B-12) 2500 MCG TABS Take 2,500 mcg  by mouth daily.     docusate sodium (COLACE) 100 MG capsule Take 100 mg by mouth daily as needed for constipation.     meloxicam (MOBIC) 15 MG tablet Take 1 tablet (15 mg total) by mouth daily. (Patient taking differently: Take 15 mg by mouth daily as needed for pain.) 90 tablet 1   Misc Natural Products (PUMPKIN SEED OIL PO) Take 1,000 mg by mouth daily.     OVER THE COUNTER MEDICATION Apply 1 Application topically daily as needed (itching/rash). Exederm     oxyCODONE (ROXICODONE) 5 MG immediate release tablet Take 1 tablet (5 mg total) by mouth every 6 (six) hours as needed for severe pain. 16 tablet 0   Saw Palmetto, Serenoa repens, (SAW PALMETTO PO) Take 250 mg by mouth daily.     sulfamethoxazole-trimethoprim (BACTRIM DS) 800-160 MG tablet Take 1 tablet by mouth 2 (two) times daily. 14 tablet 0   Wheat Dextrin (BENEFIBER PO) Take 1 Scoop by mouth daily.     No facility-administered medications prior to visit.     Per HPI unless specifically indicated in ROS section below Review of Systems  Objective:  BP 136/72   Pulse 63   Temp (!) 97.5 F (36.4 C) (Temporal)   Ht 5\' 10"  (1.778 m)  Wt 217 lb 4 oz (98.5 kg)   SpO2 96%   BMI 31.17 kg/m   Wt Readings from Last 3 Encounters:  04/20/22 217 lb 4 oz (98.5 kg)  03/06/22 206 lb 9.1 oz (93.7 kg)  02/27/22 206 lb (93.4 kg)      Physical Exam Vitals and nursing note reviewed.  Constitutional:      Appearance: Normal appearance. He is not ill-appearing.  HENT:     Mouth/Throat:     Mouth: Mucous membranes are moist.     Pharynx: Oropharynx is clear. No oropharyngeal exudate or posterior oropharyngeal erythema.  Eyes:     General: No scleral icterus.       Right eye: No discharge.        Left eye: No discharge.     Extraocular Movements: Extraocular movements intact.     Conjunctiva/sclera: Conjunctivae normal.     Pupils: Pupils are equal, round, and reactive to light.     Comments:  Mild cataracts noted [place] R>L  noted Limited fundoscopic exam  Neurological:     Mental Status: He is alert.       Results for orders placed or performed during the hospital encounter of 02/27/22  Glucose, capillary  Result Value Ref Range   Glucose-Capillary 111 (H) 70 - 99 mg/dL    Assessment & Plan:   Problem List Items Addressed This Visit     Decreased visual acuity - Primary    Progressive, worsening vision despite overall normal eval at [facility] late last year.  Desires second opinion.  Anticipate progression of cataracts.  Will refer to ophthalmology in [place].       Relevant Orders   Ambulatory referral to Ophthalmology     No orders of the defined types were placed in this encounter.   Orders Placed This Encounter  Procedures   Ambulatory referral to Ophthalmology    Referral Priority:   Routine    Referral Type:   Consultation    Referral Reason:   Specialty Services Required    Requested Specialty:   Ophthalmology    Number of Visits Requested:   1    Patient Instructions  We will refer you to eye doctor in [place].  Good to see you today   Follow up plan: No follow-ups on file.  Eustaquio Boyden, MD

## 2022-04-20 NOTE — Assessment & Plan Note (Signed)
Progressive, worsening vision despite overall normal eval at [facility] late last year.  Desires second opinion.  Anticipate progression of cataracts.  Will refer to ophthalmology in [place].

## 2022-05-04 ENCOUNTER — Telehealth: Payer: PPO | Admitting: Family Medicine

## 2022-06-25 DIAGNOSIS — N393 Stress incontinence (female) (male): Secondary | ICD-10-CM | POA: Diagnosis not present

## 2022-07-30 ENCOUNTER — Telehealth: Payer: Self-pay

## 2022-07-30 NOTE — Telephone Encounter (Signed)
error 

## 2022-08-09 ENCOUNTER — Ambulatory Visit (INDEPENDENT_AMBULATORY_CARE_PROVIDER_SITE_OTHER): Payer: PPO

## 2022-08-09 VITALS — Ht 70.0 in | Wt 210.0 lb

## 2022-08-09 DIAGNOSIS — Z0000 Encounter for general adult medical examination without abnormal findings: Secondary | ICD-10-CM

## 2022-08-09 NOTE — Patient Instructions (Signed)
Juan Hunt , Thank you for taking time to come for your Medicare Wellness Visit. I appreciate your ongoing commitment to your health goals. Please review the following plan we discussed and let me know if I can assist you in the future.   Referrals/Orders/Follow-Ups/Clinician Recommendations: Aim for 30 minutes of exercise or brisk walking, 6-8 glasses of water, and 5 servings of fruits and vegetables each day.   This is a list of the screening recommended for you and due dates:  Health Maintenance  Topic Date Due   COVID-19 Vaccine (7 - 2023-24 season) 09/08/2021   Flu Shot  08/09/2022   Medicare Annual Wellness Visit  09/30/2022   DTaP/Tdap/Td vaccine (2 - Td or Tdap) 02/20/2024   Colon Cancer Screening  06/23/2028   Pneumonia Vaccine  Completed   Hepatitis C Screening  Completed   Zoster (Shingles) Vaccine  Completed   HPV Vaccine  Aged Out    Advanced directives: (Declined) Advance directive discussed with you today. Even though you declined this today, please call our office should you change your mind, and we can give you the proper paperwork for you to fill out.  Next Medicare Annual Wellness Visit scheduled for next year: Yes  Preventive Care 5 Years and Older, Male  Preventive care refers to lifestyle choices and visits with your health care provider that can promote health and wellness. What does preventive care include? A yearly physical exam. This is also called an annual well check. Dental exams once or twice a year. Routine eye exams. Ask your health care provider how often you should have your eyes checked. Personal lifestyle choices, including: Daily care of your teeth and gums. Regular physical activity. Eating a healthy diet. Avoiding tobacco and drug use. Limiting alcohol use. Practicing safe sex. Taking low doses of aspirin every day. Taking vitamin and mineral supplements as recommended by your health care provider. What happens during an annual well  check? The services and screenings done by your health care provider during your annual well check will depend on your age, overall health, lifestyle risk factors, and family history of disease. Counseling  Your health care provider may ask you questions about your: Alcohol use. Tobacco use. Drug use. Emotional well-being. Home and relationship well-being. Sexual activity. Eating habits. History of falls. Memory and ability to understand (cognition). Work and work Astronomer. Screening  You may have the following tests or measurements: Height, weight, and BMI. Blood pressure. Lipid and cholesterol levels. These may be checked every 5 years, or more frequently if you are over 66 years old. Skin check. Lung cancer screening. You may have this screening every year starting at age 91 if you have a 30-pack-year history of smoking and currently smoke or have quit within the past 15 years. Fecal occult blood test (FOBT) of the stool. You may have this test every year starting at age 71. Flexible sigmoidoscopy or colonoscopy. You may have a sigmoidoscopy every 5 years or a colonoscopy every 10 years starting at age 40. Prostate cancer screening. Recommendations will vary depending on your family history and other risks. Hepatitis C blood test. Hepatitis B blood test. Sexually transmitted disease (STD) testing. Diabetes screening. This is done by checking your blood sugar (glucose) after you have not eaten for a while (fasting). You may have this done every 1-3 years. Abdominal aortic aneurysm (AAA) screening. You may need this if you are a current or former smoker. Osteoporosis. You may be screened starting at age 32 if you  are at high risk. Talk with your health care provider about your test results, treatment options, and if necessary, the need for more tests. Vaccines  Your health care provider may recommend certain vaccines, such as: Influenza vaccine. This is recommended every  year. Tetanus, diphtheria, and acellular pertussis (Tdap, Td) vaccine. You may need a Td booster every 10 years. Zoster vaccine. You may need this after age 14. Pneumococcal 13-valent conjugate (PCV13) vaccine. One dose is recommended after age 27. Pneumococcal polysaccharide (PPSV23) vaccine. One dose is recommended after age 29. Talk to your health care provider about which screenings and vaccines you need and how often you need them. This information is not intended to replace advice given to you by your health care provider. Make sure you discuss any questions you have with your health care provider. Document Released: 01/21/2015 Document Revised: 09/14/2015 Document Reviewed: 10/26/2014 Elsevier Interactive Patient Education  2017 [employer].  Fall Prevention in the Home Falls can cause injuries. They can happen to people of all ages. There are many things you can do to make your home safe and to help prevent falls. What can I do on the outside of my home? Regularly fix the edges of walkways and driveways and fix any cracks. Remove anything that might make you trip as you walk through a door, such as a raised step or threshold. Trim any bushes or trees on the path to your home. Use bright outdoor lighting. Clear any walking paths of anything that might make someone trip, such as rocks or tools. Regularly check to see if handrails are loose or broken. Make sure that both sides of any steps have handrails. Any raised decks and porches should have guardrails on the edges. Have any leaves, snow, or ice cleared regularly. Use sand or salt on walking paths during winter. Clean up any spills in your garage right away. This includes oil or grease spills. What can I do in the bathroom? Use night lights. Install grab bars by the toilet and in the tub and shower. Do not use towel bars as grab bars. Use non-skid mats or decals in the tub or shower. If you need to sit down in the shower, use a  plastic, non-slip stool. Keep the floor dry. Clean up any water that spills on the floor as soon as it happens. Remove soap buildup in the tub or shower regularly. Attach bath mats securely with double-sided non-slip rug tape. Do not have throw rugs and other things on the floor that can make you trip. What can I do in the bedroom? Use night lights. Make sure that you have a light by your bed that is easy to reach. Do not use any sheets or blankets that are too big for your bed. They should not hang down onto the floor. Have a firm chair that has side arms. You can use this for support while you get dressed. Do not have throw rugs and other things on the floor that can make you trip. What can I do in the kitchen? Clean up any spills right away. Avoid walking on wet floors. Keep items that you use a lot in easy-to-reach places. If you need to reach something above you, use a strong step stool that has a grab bar. Keep electrical cords out of the way. Do not use floor polish or wax that makes floors slippery. If you must use wax, use non-skid floor wax. Do not have throw rugs and other things on the  floor that can make you trip. What can I do with my stairs? Do not leave any items on the stairs. Make sure that there are handrails on both sides of the stairs and use them. Fix handrails that are broken or loose. Make sure that handrails are as long as the stairways. Check any carpeting to make sure that it is firmly attached to the stairs. Fix any carpet that is loose or worn. Avoid having throw rugs at the top or bottom of the stairs. If you do have throw rugs, attach them to the floor with carpet tape. Make sure that you have a light switch at the top of the stairs and the bottom of the stairs. If you do not have them, ask someone to add them for you. What else can I do to help prevent falls? Wear shoes that: Do not have high heels. Have rubber bottoms. Are comfortable and fit you  well. Are closed at the toe. Do not wear sandals. If you use a stepladder: Make sure that it is fully opened. Do not climb a closed stepladder. Make sure that both sides of the stepladder are locked into place. Ask someone to hold it for you, if possible. Clearly mark and make sure that you can see: Any grab bars or handrails. First and last steps. Where the edge of each step is. Use tools that help you move around (mobility aids) if they are needed. These include: Canes. Walkers. Scooters. Crutches. Turn on the lights when you go into a dark area. Replace any light bulbs as soon as they burn out. Set up your furniture so you have a clear path. Avoid moving your furniture around. If any of your floors are uneven, fix them. If there are any pets around you, be aware of where they are. Review your medicines with your doctor. Some medicines can make you feel dizzy. This can increase your chance of falling. Ask your doctor what other things that you can do to help prevent falls. This information is not intended to replace advice given to you by your health care provider. Make sure you discuss any questions you have with your health care provider. Document Released: 10/21/2008 Document Revised: 06/02/2015 Document Reviewed: 01/29/2014 Elsevier Interactive Patient Education  2017 [employer].
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
## 2022-08-09 NOTE — Addendum Note (Signed)
Addended by: Maryan Puls on: 08/09/2022 11:09 AM   Modules accepted: Level of Service

## 2022-10-29 DIAGNOSIS — H25813 Combined forms of age-related cataract, bilateral: Secondary | ICD-10-CM | POA: Diagnosis not present

## 2022-10-29 DIAGNOSIS — H53021 Refractive amblyopia, right eye: Secondary | ICD-10-CM | POA: Diagnosis not present

## 2022-11-29 DIAGNOSIS — H2511 Age-related nuclear cataract, right eye: Secondary | ICD-10-CM | POA: Diagnosis not present

## 2022-12-10 DIAGNOSIS — H25812 Combined forms of age-related cataract, left eye: Secondary | ICD-10-CM | POA: Diagnosis not present

## 2022-12-10 DIAGNOSIS — Z961 Presence of intraocular lens: Secondary | ICD-10-CM | POA: Diagnosis not present

## 2022-12-10 DIAGNOSIS — H2512 Age-related nuclear cataract, left eye: Secondary | ICD-10-CM | POA: Diagnosis not present

## 2022-12-13 DIAGNOSIS — H2512 Age-related nuclear cataract, left eye: Secondary | ICD-10-CM | POA: Diagnosis not present

## 2023-01-07 ENCOUNTER — Telehealth: Payer: Self-pay | Admitting: Family Medicine

## 2023-01-07 NOTE — Telephone Encounter (Signed)
Placed form in Dr. G's box.  

## 2023-01-07 NOTE — Telephone Encounter (Signed)
Filled and in Lisa's box 

## 2023-01-07 NOTE — Telephone Encounter (Signed)
Patient dropped of ppwk to renew handicap place card. Placed in Dr Sharen Hones box

## 2023-01-08 NOTE — Telephone Encounter (Addendum)
Spoke with pt notifying him form is ready to pick up. Pt expresses his thanks and will pick up on 01/09/22.  [Placed form at front office.]

## 2023-01-22 ENCOUNTER — Encounter: Payer: PPO | Admitting: Family Medicine

## 2023-03-05 ENCOUNTER — Encounter: Payer: PPO | Admitting: Family Medicine

## 2023-03-26 ENCOUNTER — Ambulatory Visit (INDEPENDENT_AMBULATORY_CARE_PROVIDER_SITE_OTHER): Payer: PPO | Admitting: Family Medicine

## 2023-03-26 ENCOUNTER — Encounter: Payer: Self-pay | Admitting: *Deleted

## 2023-03-26 ENCOUNTER — Ambulatory Visit
Admission: RE | Admit: 2023-03-26 | Discharge: 2023-03-26 | Disposition: A | Discharge status: Home / Self Care | Source: Ambulatory Visit | Attending: Family Medicine | Admitting: Family Medicine

## 2023-03-26 VITALS — BP 140/70 | HR 60 | Temp 98.1°F | Ht 68.0 in | Wt 215.5 lb

## 2023-03-26 DIAGNOSIS — M533 Sacrococcygeal disorders, not elsewhere classified: Secondary | ICD-10-CM | POA: Insufficient documentation

## 2023-03-26 DIAGNOSIS — Z0000 Encounter for general adult medical examination without abnormal findings: Secondary | ICD-10-CM | POA: Diagnosis not present

## 2023-03-26 DIAGNOSIS — Z7189 Other specified counseling: Secondary | ICD-10-CM

## 2023-03-26 DIAGNOSIS — C61 Malignant neoplasm of prostate: Secondary | ICD-10-CM

## 2023-03-26 DIAGNOSIS — R32 Unspecified urinary incontinence: Secondary | ICD-10-CM

## 2023-03-26 DIAGNOSIS — E538 Deficiency of other specified B group vitamins: Secondary | ICD-10-CM | POA: Diagnosis not present

## 2023-03-26 DIAGNOSIS — D709 Neutropenia, unspecified: Secondary | ICD-10-CM

## 2023-03-26 DIAGNOSIS — M47816 Spondylosis without myelopathy or radiculopathy, lumbar region: Secondary | ICD-10-CM | POA: Diagnosis not present

## 2023-03-26 DIAGNOSIS — M67432 Ganglion, left wrist: Secondary | ICD-10-CM

## 2023-03-26 DIAGNOSIS — R7303 Prediabetes: Secondary | ICD-10-CM

## 2023-03-26 DIAGNOSIS — E66811 Obesity, class 1: Secondary | ICD-10-CM

## 2023-03-26 DIAGNOSIS — E785 Hyperlipidemia, unspecified: Secondary | ICD-10-CM | POA: Diagnosis not present

## 2023-03-26 LAB — CBC WITH DIFFERENTIAL/PLATELET
Basophils Absolute: 0 10*3/uL (ref 0.0–0.1)
Basophils Relative: 0.7 % (ref 0.0–3.0)
Eosinophils Absolute: 0 10*3/uL (ref 0.0–0.7)
Eosinophils Relative: 1 % (ref 0.0–5.0)
HCT: 43.4 % (ref 39.0–52.0)
Hemoglobin: 14.1 g/dL (ref 13.0–17.0)
Lymphocytes Relative: 54.4 % — ABNORMAL HIGH (ref 12.0–46.0)
Lymphs Abs: 2 10*3/uL (ref 0.7–4.0)
MCHC: 32.4 g/dL (ref 30.0–36.0)
MCV: 82.4 fl (ref 78.0–100.0)
Monocytes Absolute: 0.4 10*3/uL (ref 0.1–1.0)
Monocytes Relative: 11.1 % (ref 3.0–12.0)
Neutro Abs: 1.2 10*3/uL — ABNORMAL LOW (ref 1.4–7.7)
Neutrophils Relative %: 32.8 % — ABNORMAL LOW (ref 43.0–77.0)
Platelets: 196 10*3/uL (ref 150.0–400.0)
RBC: 5.27 Mil/uL (ref 4.22–5.81)
RDW: 14.9 % (ref 11.5–15.5)
WBC: 3.7 10*3/uL — ABNORMAL LOW (ref 4.0–10.5)

## 2023-03-26 LAB — COMPREHENSIVE METABOLIC PANEL
ALT: 19 U/L (ref 0–53)
AST: 17 U/L (ref 0–37)
Albumin: 4.4 g/dL (ref 3.5–5.2)
Alkaline Phosphatase: 69 U/L (ref 39–117)
BUN: 19 mg/dL (ref 6–23)
CO2: 29 meq/L (ref 19–32)
Calcium: 9.6 mg/dL (ref 8.4–10.5)
Chloride: 101 meq/L (ref 96–112)
Creatinine, Ser: 0.97 mg/dL (ref 0.40–1.50)
GFR: 77.75 mL/min (ref >60.00)
Glucose, Bld: 91 mg/dL (ref 70–99)
Potassium: 4.5 meq/L (ref 3.5–5.1)
Sodium: 138 meq/L (ref 135–145)
Total Bilirubin: 0.4 mg/dL (ref 0.2–1.2)
Total Protein: 7.6 g/dL (ref 6.0–8.3)

## 2023-03-26 LAB — PSA: PSA: 0.03 ng/mL — ABNORMAL LOW (ref 0.10–4.00)

## 2023-03-26 LAB — LIPID PANEL
Cholesterol: 221 mg/dL — ABNORMAL HIGH (ref 0–200)
HDL: 40.2 mg/dL (ref >39.00)
LDL Cholesterol: 164 mg/dL — ABNORMAL HIGH (ref 0–99)
NonHDL: 180.54
Total CHOL/HDL Ratio: 5
Triglycerides: 84 mg/dL (ref 0.0–149.0)
VLDL: 16.8 mg/dL (ref 0.0–40.0)

## 2023-03-26 LAB — VITAMIN B12: Vitamin B-12: 338 pg/mL (ref 211–911)

## 2023-03-26 LAB — HEMOGLOBIN A1C: Hgb A1c MFr Bld: 6.3 % (ref 4.6–6.5)

## 2023-03-26 NOTE — Assessment & Plan Note (Signed)
 Overall improved after urethral sling surgery.

## 2023-03-26 NOTE — Assessment & Plan Note (Addendum)
 Encouraged restarting regular walking routine for goal sustainable weight loss.  He asks about weight loss medication  - he may check with insurance on GLP1RA coverage.  Discussed Noom mindset book.

## 2023-03-26 NOTE — Assessment & Plan Note (Signed)
 Advanced directive discussion - working on updating this - asked to bring [place] copy when complete. Would want daughter Donald Siva to be HCPOA. Full code.

## 2023-03-26 NOTE — Assessment & Plan Note (Signed)
 Chronic ,ongoing for 6+ months  Update coccyx films today, discussed PT referral option.

## 2023-03-26 NOTE — Assessment & Plan Note (Signed)
 Update levels on daily replacement.

## 2023-03-26 NOTE — Assessment & Plan Note (Signed)
 Update CBC.

## 2023-03-26 NOTE — Assessment & Plan Note (Signed)
 Requests reassessment by hand surgeon for definitive treatment.

## 2023-03-26 NOTE — Assessment & Plan Note (Signed)
 Chronic, stable on atorvastatin - update FLP. The 10-year ASCVD risk score (Arnett DK, et al., 2019) is: 16.4%   Values used to calculate the score:     Age: 73 years     Sex: Male     Is Non-Hispanic African American: Yes     Diabetic: No     Tobacco smoker: No     Systolic Blood Pressure: 144 mmHg     Is BP treated: No     HDL Cholesterol: 37.7 mg/dL     Total Cholesterol: 191 mg/dL

## 2023-03-26 NOTE — Progress Notes (Addendum)
 Ph: [phone] Fax: [phone]   Patient ID: Juan Hunt, male    DOB: 1950/11/09, 73 y.o.   MRN: [id number]  This visit was conducted in person.  BP (!) 140/70 (BP Location: Right Arm, Cuff Size: Large)   Pulse 60   Temp 98.1 F (36.7 C) (Oral)   Ht 5\' 8"  (1.727 m)   Wt 215 lb 8 oz (97.8 kg)   SpO2 97%   BMI 32.77 kg/m   BP Readings from Last 3 Encounters:  03/26/23 (!) 140/70  04/20/22 136/72  03/07/22 (!) 141/62   CC: CPE Subjective:   HPI: Juan Hunt is a 73 y.o. male presenting on 03/26/2023 for Annual Exam (MCR prt 2 [AWV- 08/09/22]. )   Saw health advisor 08/2022 for medicare wellness visit. Note reviewed.   No results found.  Flowsheet Row Clinical Support from 08/09/2022 in [facility] [place]  PHQ-2 Total Score 0          08/02/2022   11:00 AM 04/20/2022    2:10 PM 02/12/2022   12:14 PM 09/29/2021    3:35 PM 09/28/2020    3:20 PM  Fall Risk   Falls in the past year? 0 0 0 0 0  Number falls in past yr: 0      Injury with Fall? 0      Risk for fall due to : No Fall Risks      Follow up Falls prevention discussed;Falls evaluation completed       S/p urethral sling 11/2021, again 02/2022.   Viral URI a few weeks ago - with residual rhinorrhea. Managing with alka seltzer plus and dayquil - discussed caution with decongestants.    Chronic  neutrophilic leukopenia - overall feels well.   BP elevated today.  Not taking celebrex 200mg .  Taking meloxicam 15mg  twice weekly.   Requests return to hand surgery for L wrist ganglion cyst definitive treatment  6+ months of intermittent chronic lower back pain at sacral area, at times painful to sit down - uses donut cushion with benefit. Denies inciting trauma/injury or falls.   Notes worsening hand pain stiffness without swelling/warmth or redness of joints.   Preventative: COLONOSCOPY Date: 04/2013 2 TA, mod diverticulosis, rpt 5 yrs (Pyrtle) COLONOSCOPY 06/2018 -  diverticulosis, rpt 33yr (Pyrtle)  Low risk prostate cancer - followed by urology Dr Marlou Porch s/p robotic assisted lap prostatectomy 08/2018. Some ongoing urinary incontinence - see above  Lung cancer screening - Not eligible. Ex smoker quit 1999 - smoked 1/2 ppd for about 12 yrs.  Flu shot - yearly  Moderna covid vaccine 01/2019, 02/2019, Pfizer boosters 11/2019, 04/2020, bivalent 09/2020 and 05/2021, 10/2022  Pneumovax 10/2016, prevnar-20 04/2021   Tdap 02/2014 Zostavax - 2014 Shingrix - 05/2021, 08/2021  Advanced directive discussion - working on updating this - asked to bring [place] copy when complete. Would want daughter Donald Siva to be HCPOA. Full code.  Seat belt use discussed  Sunscreen use discussed, no changing moles on skin.  Sleep - averaging 7 hours/night Ex smoker - quit 1999 Alcohol - none Dentist - q6 mo Eye exam - yearly scheduled for next month - monitoring borderline pressures. Cataract surgeries 12/2022 ([place])  Bowel - no constipation  Bladder - h/o incontinence s/p successful urethral sling surgery 02/2022  Lives with wife Grown children [employer] Occupation: retired Nutritional therapist, still does some plumbing work.   Edu: HS  Activity: [employer] 2x/wk  Diet: some water,  fruits/vegetables daily, working on [employer]      Relevant past medical, surgical, family and social history reviewed and updated as indicated. Interim medical history since our last visit reviewed. Allergies and medications reviewed and updated. Outpatient Medications Prior to Visit  Medication Sig Dispense Refill   atorvastatin (LIPITOR) 40 MG tablet Take 40 mg by mouth daily.     Capsicum-Garlic (CAYENNE PLUS GARLIC PO) Take 1 capsule by mouth daily.     Cholecalciferol (VITAMIN D3) 250 MCG (10000 UT) TABS Take 20,000 Units by mouth daily.     Cyanocobalamin (B-12) 2500 MCG TABS Take 2,500 mcg by mouth daily.     docusate sodium (COLACE) 100 MG capsule Take 100 mg by mouth daily as  needed for constipation.     meloxicam (MOBIC) 15 MG tablet Take 1 tablet (15 mg total) by mouth daily. (Patient taking differently: Take 15 mg by mouth daily as needed for pain.) 90 tablet 1   Misc Natural Products (PUMPKIN SEED OIL PO) Take 1,000 mg by mouth daily.     OVER THE COUNTER MEDICATION Apply 1 Application topically daily as needed (itching/rash). Exederm     Saw Palmetto, Serenoa repens, (SAW PALMETTO PO) Take 250 mg by mouth daily.     Wheat Dextrin (BENEFIBER PO) Take 1 Scoop by mouth daily.     celecoxib (CELEBREX) 200 MG capsule Take 1 capsule (200 mg total) by mouth 2 (two) times daily. 28 capsule 1   sulfamethoxazole-trimethoprim (BACTRIM DS) 800-160 MG tablet Take 1 tablet by mouth 2 (two) times daily. 14 tablet 0   oxyCODONE (ROXICODONE) 5 MG immediate release tablet Take 1 tablet (5 mg total) by mouth every 6 (six) hours as needed for severe pain. (Patient not taking: Reported on 08/09/2022) 16 tablet 0   No facility-administered medications prior to visit.     Per HPI unless specifically indicated in ROS section below Review of Systems  Constitutional:  Negative for activity change, appetite change, chills, fatigue, fever and unexpected weight change.  HENT:  Positive for congestion and rhinorrhea. Negative for hearing loss.   Eyes:  Negative for visual disturbance.  Respiratory:  Positive for cough (recent cold). Negative for chest tightness, shortness of breath and wheezing.   Cardiovascular:  Negative for chest pain, palpitations and leg swelling.  Gastrointestinal:  Negative for abdominal distention, abdominal pain, blood in stool, constipation, diarrhea, nausea and vomiting.  Genitourinary:  Negative for difficulty urinating and hematuria.  Musculoskeletal:  Negative for arthralgias, myalgias and neck pain.  Skin:  Negative for rash.  Neurological:  Negative for dizziness, seizures, syncope and headaches.  Hematological:  Negative for adenopathy. Does not  bruise/bleed easily.  Psychiatric/Behavioral:  Negative for dysphoric mood. The patient is not nervous/anxious.     Objective:  BP (!) 140/70 (BP Location: Right Arm, Cuff Size: Large)   Pulse 60   Temp 98.1 F (36.7 C) (Oral)   Ht 5\' 8"  (1.727 m)   Wt 215 lb 8 oz (97.8 kg)   SpO2 97%   BMI 32.77 kg/m   Wt Readings from Last 3 Encounters:  03/26/23 215 lb 8 oz (97.8 kg)  08/09/22 210 lb (95.3 kg)  04/20/22 217 lb 4 oz (98.5 kg)      Physical Exam Vitals and nursing note reviewed.  Constitutional:      General: He is not in acute distress.    Appearance: Normal appearance. He is well-developed. He is not ill-appearing.  HENT:     Head: Normocephalic  and atraumatic.     Right Ear: Hearing, tympanic membrane, ear canal and external ear normal.     Left Ear: Hearing, tympanic membrane, ear canal and external ear normal.     Mouth/Throat:     Mouth: Mucous membranes are moist.     Pharynx: Oropharynx is clear. No oropharyngeal exudate or posterior oropharyngeal erythema.  Eyes:     General: No scleral icterus.    Extraocular Movements: Extraocular movements intact.     Conjunctiva/sclera: Conjunctivae normal.     Pupils: Pupils are equal, round, and reactive to light.  Neck:     Thyroid: No thyroid mass or thyromegaly.     Vascular: No carotid bruit.  Cardiovascular:     Rate and Rhythm: Normal rate and regular rhythm.     Pulses: Normal pulses.          Radial pulses are 2+ on the right side and 2+ on the left side.     Heart sounds: Normal heart sounds. No murmur heard. Pulmonary:     Effort: Pulmonary effort is normal. No respiratory distress.     Breath sounds: Normal breath sounds. No wheezing, rhonchi or rales.  Abdominal:     General: Bowel sounds are normal. There is no distension.     Palpations: Abdomen is soft. There is no mass.     Tenderness: There is no abdominal tenderness. There is no guarding or rebound.     Hernia: No hernia is present.   Musculoskeletal:        General: Normal range of motion.     Cervical back: Normal range of motion and neck supple.     Right lower leg: No edema.     Left lower leg: No edema.     Comments:  Mild discomfort to palpation of coccyx  Lymphadenopathy:     Cervical: No cervical adenopathy.  Skin:    General: Skin is warm and dry.     Findings: No rash.  Neurological:     General: No focal deficit present.     Mental Status: He is alert and oriented to person, place, and time.  Psychiatric:        Mood and Affect: Mood normal.        Behavior: Behavior normal.        Thought Content: Thought content normal.        Judgment: Judgment normal.       Results for orders placed or performed during the hospital encounter of 02/27/22  Glucose, capillary   Collection Time: 02/27/22  8:36 AM  Result Value Ref Range   Glucose-Capillary 111 (H) 70 - 99 mg/dL   Lab Results  Component Value Date   PSA1 6.6 (H) 08/28/2016   PSA1 5.0 (H) 03/05/2016   PSA1 5.6 (H) 02/28/2015   PSA 0.00 Repeated and verified X2. (L) 09/28/2020   PSA 7.19 (H) 05/30/2018   PSA 5.87 (H) 02/23/2015   Assessment & Plan:   Problem List Items Addressed This Visit     Health maintenance examination - Primary (Chronic)   Preventative protocols reviewed and updated unless pt declined. Discussed healthy diet and lifestyle.       Advanced care planning/counseling discussion (Chronic)   Advanced directive discussion - working on updating this - asked to bring [place] copy when complete. Would want daughter Donald Siva to be HCPOA. Full code.       Prediabetes   Update A1c.       Relevant Orders  Comprehensive metabolic panel   Hemoglobin A1c   Hyperlipidemia   Chronic, stable on atorvastatin - update FLP. The 10-year ASCVD risk score (Arnett DK, et al., 2019) is: 16.4%   Values used to calculate the score:     Age: 75 years     Sex: Male     Is Non-Hispanic African American: Yes     Diabetic: No      Tobacco smoker: No     Systolic Blood Pressure: 144 mmHg     Is BP treated: No     HDL Cholesterol: 37.7 mg/dL     Total Cholesterol: 191 mg/dL       Relevant Orders   Lipid panel   Comprehensive metabolic panel   Obesity, Class I, BMI 30-34.9   Encouraged restarting regular walking routine for goal sustainable weight loss.  He asks about weight loss medication  - he may check with insurance on GLP1RA coverage.  Discussed Noom mindset book.       Prostate cancer [facility])   S/p radical prostatectomy 08/2018 Was followed by urology, he states released from care.  Will update PSA today.       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       Relevant Orders   Ambulatory referral to Hand Surgery   Neutropenia (HCC)   Update CBC.       Relevant Orders   CBC with Differential/Platelet   Low serum vitamin B12   Update levels on daily replacement.       Relevant Orders   Vitamin B12   Urinary incontinence   Overall improved after urethral sling surgery.       Coccydynia   Chronic ,ongoing for 6+ months  Update coccyx films today, discussed PT referral option.       Relevant Orders   DG Sacrum/Coccyx     No orders of the defined types were placed in this encounter.   Orders Placed This Encounter  Procedures   DG Sacrum/Coccyx    Standing Status:   Future    Number of Occurrences:   1    Expiration Date:   03/25/2024    Reason for Exam (SYMPTOM  OR DIAGNOSIS REQUIRED):   coccyx pain x 6 months    Preferred imaging location?:   [facility]   Lipid panel   Comprehensive metabolic panel   Hemoglobin A1c   PSA   CBC with Differential/Platelet   Vitamin B12   Ambulatory referral to Hand Surgery    Referral Priority:   Routine    Referral Type:   Surgical    Referral Reason:   Specialty Services Required    Requested Specialty:   Hand Surgery    Number of  Visits Requested:   1    Patient Instructions  Labs today  Consider BP cuff to monitor blood pressures at home. Goal <140/90.  Check with insurance on coverage for Wegovy and Zepbound injectable weight loss medications.  Look into the Noom Mindset book.  Restart walking routine.  Tailbone xray today - let me know if interested in physical therapy referral.  Good to see you today Return as needed or in 1 year for next physical.   Follow up plan: Return in about 1 year (around 03/25/2024) for annual exam, prior fasting for blood work, medicare wellness visit.  Eustaquio Boyden, MD

## 2023-03-26 NOTE — Assessment & Plan Note (Signed)
 S/p radical prostatectomy 08/2018 Was followed by urology, he states released from care.  Will update PSA today.

## 2023-03-26 NOTE — Patient Instructions (Addendum)
 Labs today  Consider BP cuff to monitor blood pressures at home. Goal <140/90.  Check with insurance on coverage for Wegovy and Zepbound injectable weight loss medications.  Look into the Noom Mindset book.  Restart walking routine.  Tailbone xray today - let me know if interested in physical therapy referral.  Good to see you today Return as needed or in 1 year for next physical.

## 2023-03-26 NOTE — Assessment & Plan Note (Signed)
 Update A1c ?

## 2023-03-26 NOTE — Assessment & Plan Note (Signed)
 Preventative protocols reviewed and updated unless pt declined. Discussed healthy diet and lifestyle.

## 2023-03-30 ENCOUNTER — Encounter: Payer: Self-pay | Admitting: Family Medicine

## 2023-04-10 ENCOUNTER — Encounter: Payer: Self-pay | Admitting: Family Medicine

## 2023-04-11 DIAGNOSIS — M67432 Ganglion, left wrist: Secondary | ICD-10-CM | POA: Diagnosis not present

## 2023-07-26 DIAGNOSIS — H53021 Refractive amblyopia, right eye: Secondary | ICD-10-CM | POA: Diagnosis not present

## 2023-07-26 DIAGNOSIS — Z961 Presence of intraocular lens: Secondary | ICD-10-CM | POA: Diagnosis not present

## 2023-08-14 ENCOUNTER — Ambulatory Visit (INDEPENDENT_AMBULATORY_CARE_PROVIDER_SITE_OTHER): Payer: PPO

## 2023-08-14 VITALS — Ht 68.0 in | Wt 215.0 lb

## 2023-08-14 DIAGNOSIS — Z0000 Encounter for general adult medical examination without abnormal findings: Secondary | ICD-10-CM

## 2023-08-14 NOTE — Patient Instructions (Signed)
 [name] , Thank you for taking time out of your busy schedule to complete your Annual Wellness Visit with me. I enjoyed our conversation and look forward to speaking with you again next year. I, as well as your care team,  appreciate your ongoing commitment to your health goals. Please review the following plan we discussed and let me know if I can assist you in the future. Your Game plan/ To Do List    Referrals: If you haven't heard from the office you've been referred to, please reach out to them at the phone provided.   Follow up Visits: We will see or speak with you next year for your Next Medicare AWV with our clinical staff  08/14/24 @ 8:10am televisit Have you seen your provider in the last 6 months (3 months if uncontrolled diabetes)? Yes  Clinician Recommendations:  Aim for 30 minutes of exercise or brisk walking, 6-8 glasses of water , and 5 servings of fruits and vegetables each day.       This is a list of the screenings recommended for you:  Health Maintenance  Topic Date Due   COVID-19 Vaccine (8 - 2024-25 season) 12/19/2022   Medicare Annual Wellness Visit  08/09/2023   Flu Shot  08/09/2023   DTaP/Tdap/Td vaccine (2 - Td or Tdap) 02/20/2024   Colon Cancer Screening  06/23/2028   Pneumococcal Vaccine for age over 71  Completed   Hepatitis C Screening  Completed   Zoster (Shingles) Vaccine  Completed   Hepatitis B Vaccine  Aged Out   HPV Vaccine  Aged Out   Meningitis B Vaccine  Aged Out    Advanced directives: (ACP Link)Information on Advanced Care Planning can be found at [place]  Secretary of [facility] Advance Health Care Directives Advance Health Care Directives. [email or web address]  Advance Care Planning is important because it:  [x]  Makes sure you receive the medical care that is consistent with your values, goals, and preferences  [x]  It provides guidance to your family and loved ones and reduces their decisional burden about whether or not they are making the right  decisions based on your wishes.  Follow the link provided in your after visit summary or read over the paperwork we have mailed to you to help you started getting your Advance Directives in place. If you need assistance in completing these, please reach out to us  so that we can help you!
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
## 2024-03-23 ENCOUNTER — Other Ambulatory Visit

## 2024-03-30 ENCOUNTER — Encounter: Admitting: Family Medicine

## 2024-08-14 ENCOUNTER — Ambulatory Visit
